# Patient Record
Sex: Male | Born: 1940
Health system: Southern US, Community
[De-identification: ages and names within clinical notes are randomized; demographics above are authoritative.]

## PROBLEM LIST (undated history)

## (undated) DIAGNOSIS — I639 Cerebral infarction, unspecified: Secondary | ICD-10-CM

## (undated) DIAGNOSIS — F329 Major depressive disorder, single episode, unspecified: Secondary | ICD-10-CM

## (undated) DIAGNOSIS — I739 Peripheral vascular disease, unspecified: Secondary | ICD-10-CM

## (undated) DIAGNOSIS — K219 Gastro-esophageal reflux disease without esophagitis: Secondary | ICD-10-CM

## (undated) DIAGNOSIS — F32A Depression, unspecified: Secondary | ICD-10-CM

## (undated) DIAGNOSIS — I10 Essential (primary) hypertension: Secondary | ICD-10-CM

## (undated) DIAGNOSIS — E7800 Pure hypercholesterolemia, unspecified: Secondary | ICD-10-CM

## (undated) HISTORY — PX: LUMBAR DISC SURGERY: SHX700

## (undated) HISTORY — PX: CHOLECYSTECTOMY: SHX55

---

## 2009-10-22 HISTORY — PX: VASCULAR SURGERY: SHX849

## 2010-04-24 ENCOUNTER — Emergency Department: Payer: Self-pay | Admitting: Emergency Medicine

## 2012-06-30 ENCOUNTER — Ambulatory Visit: Payer: Self-pay | Admitting: Family Medicine

## 2012-07-14 ENCOUNTER — Observation Stay: Payer: Self-pay | Admitting: Internal Medicine

## 2012-07-14 LAB — COMPREHENSIVE METABOLIC PANEL
Albumin: 4.1 g/dL (ref 3.4–5.0)
Alkaline Phosphatase: 109 U/L (ref 50–136)
BUN: 22 mg/dL — ABNORMAL HIGH (ref 7–18)
Bilirubin,Total: 1.1 mg/dL — ABNORMAL HIGH (ref 0.2–1.0)
Co2: 24 mmol/L (ref 21–32)
Creatinine: 0.79 mg/dL (ref 0.60–1.30)
EGFR (Non-African Amer.): >60
Glucose: 87 mg/dL (ref 65–99)
Osmolality: 275 (ref 275–301)
Potassium: 3.4 mmol/L — ABNORMAL LOW (ref 3.5–5.1)
SGPT (ALT): 19 U/L (ref 12–78)
Sodium: 136 mmol/L (ref 136–145)
Total Protein: 7.3 g/dL (ref 6.4–8.2)

## 2012-07-14 LAB — URINALYSIS, COMPLETE
Glucose,UR: NEGATIVE mg/dL (ref 0–75)
Leukocyte Esterase: NEGATIVE
Nitrite: NEGATIVE
Ph: 6 (ref 4.5–8.0)
Protein: NEGATIVE
RBC,UR: 1 /HPF (ref 0–5)
WBC UR: 4 /HPF (ref 0–5)

## 2012-07-14 LAB — CBC
HCT: 55.2 % — ABNORMAL HIGH (ref 40.0–52.0)
HGB: 19.9 g/dL — ABNORMAL HIGH (ref 13.0–18.0)
MCH: 32.7 pg (ref 26.0–34.0)
RBC: 6.08 10*6/uL — ABNORMAL HIGH (ref 4.40–5.90)
RDW: 13.4 % (ref 11.5–14.5)
WBC: 12.4 10*3/uL — ABNORMAL HIGH (ref 3.8–10.6)

## 2012-07-14 LAB — LIPASE, BLOOD: Lipase: 85 U/L (ref 73–393)

## 2012-07-15 LAB — CBC WITH DIFFERENTIAL/PLATELET
Basophil %: 0.3 %
Eosinophil %: 0.6 %
HGB: 16.7 g/dL (ref 13.0–18.0)
MCH: 32.6 pg (ref 26.0–34.0)
Monocyte %: 8.6 %
Neutrophil #: 6.8 10*3/uL — ABNORMAL HIGH (ref 1.4–6.5)
Neutrophil %: 61.2 %
Platelet: 151 10*3/uL (ref 150–440)
RBC: 5.13 10*6/uL (ref 4.40–5.90)
RDW: 13.2 % (ref 11.5–14.5)
WBC: 11.2 10*3/uL — ABNORMAL HIGH (ref 3.8–10.6)

## 2012-07-15 LAB — COMPREHENSIVE METABOLIC PANEL
Anion Gap: 11 (ref 7–16)
Calcium, Total: 8.7 mg/dL (ref 8.5–10.1)
Chloride: 104 mmol/L (ref 98–107)
Co2: 23 mmol/L (ref 21–32)
EGFR (African American): >60
EGFR (Non-African Amer.): >60
Osmolality: 276 (ref 275–301)
Potassium: 3.3 mmol/L — ABNORMAL LOW (ref 3.5–5.1)
SGPT (ALT): 14 U/L (ref 12–78)
Sodium: 138 mmol/L (ref 136–145)
Total Protein: 5.7 g/dL — ABNORMAL LOW (ref 6.4–8.2)

## 2012-07-15 LAB — MAGNESIUM: Magnesium: 1.8 mg/dL

## 2012-10-29 DIAGNOSIS — F1011 Alcohol abuse, in remission: Secondary | ICD-10-CM | POA: Insufficient documentation

## 2013-11-05 DIAGNOSIS — Z0000 Encounter for general adult medical examination without abnormal findings: Secondary | ICD-10-CM | POA: Diagnosis not present

## 2013-11-05 DIAGNOSIS — I10 Essential (primary) hypertension: Secondary | ICD-10-CM | POA: Diagnosis not present

## 2013-11-05 DIAGNOSIS — I739 Peripheral vascular disease, unspecified: Secondary | ICD-10-CM | POA: Diagnosis not present

## 2013-11-05 DIAGNOSIS — I6350 Cerebral infarction due to unspecified occlusion or stenosis of unspecified cerebral artery: Secondary | ICD-10-CM | POA: Diagnosis not present

## 2014-12-29 DIAGNOSIS — M1990 Unspecified osteoarthritis, unspecified site: Secondary | ICD-10-CM | POA: Diagnosis not present

## 2014-12-29 DIAGNOSIS — Z0000 Encounter for general adult medical examination without abnormal findings: Secondary | ICD-10-CM | POA: Diagnosis not present

## 2014-12-29 DIAGNOSIS — Z720 Tobacco use: Secondary | ICD-10-CM | POA: Diagnosis not present

## 2014-12-29 DIAGNOSIS — G4700 Insomnia, unspecified: Secondary | ICD-10-CM | POA: Diagnosis not present

## 2014-12-29 DIAGNOSIS — I6350 Cerebral infarction due to unspecified occlusion or stenosis of unspecified cerebral artery: Secondary | ICD-10-CM | POA: Diagnosis not present

## 2014-12-29 DIAGNOSIS — D485 Neoplasm of uncertain behavior of skin: Secondary | ICD-10-CM | POA: Diagnosis not present

## 2014-12-29 DIAGNOSIS — R233 Spontaneous ecchymoses: Secondary | ICD-10-CM | POA: Diagnosis not present

## 2014-12-29 DIAGNOSIS — K219 Gastro-esophageal reflux disease without esophagitis: Secondary | ICD-10-CM | POA: Diagnosis not present

## 2014-12-29 DIAGNOSIS — I739 Peripheral vascular disease, unspecified: Secondary | ICD-10-CM | POA: Diagnosis not present

## 2014-12-29 DIAGNOSIS — I10 Essential (primary) hypertension: Secondary | ICD-10-CM | POA: Diagnosis not present

## 2014-12-29 DIAGNOSIS — Z23 Encounter for immunization: Secondary | ICD-10-CM | POA: Diagnosis not present

## 2014-12-29 LAB — CBC AND DIFFERENTIAL
HCT: 53 % (ref 41–53)
Hemoglobin: 18.6 g/dL — AB (ref 13.5–17.5)

## 2014-12-29 LAB — BASIC METABOLIC PANEL
BUN: 11 mg/dL (ref 4–21)
CREATININE: 1 mg/dL (ref <=1.3)

## 2014-12-29 LAB — LIPID PANEL
Cholesterol: 200 mg/dL (ref 0–200)
HDL: 35 mg/dL (ref 35–70)
LDL Cholesterol: 122 mg/dL
Triglycerides: 217 mg/dL — AB (ref 40–160)

## 2014-12-29 LAB — TSH: TSH: 2.2 u[IU]/mL (ref <=5.90)

## 2014-12-29 LAB — HM COLONOSCOPY

## 2014-12-29 LAB — HM PAP SMEAR

## 2015-02-08 NOTE — Discharge Summary (Signed)
PATIENT NAME:  Alexander Alvarado, Alexander Alvarado MR#:  [id number] DATE OF BIRTH:  02/03/1941  DATE OF ADMISSION:  07/14/2012 DATE OF DISCHARGE:  07/15/2012  PRESENTING COMPLAINT: Nausea, vomiting, diarrhea, and weakness.   DISCHARGE DIAGNOSES:  1. Suspected acute gastroenteritis, improved.  2. Dehydration.  3. Hypertension.   CODE STATUS: FULL CODE.   MEDICATIONS:  1. Tylenol 500 mg 2 tablets at bedtime.  2. Trazodone 50 mg 3 tablets at bedtime.  3. Tramadol 50 mg b.i.d.  4. Meloxicam 7.5 mg 2 tablets at bedtime.  5. Bupropion 150 mg 1 tablet b.i.d.  6. Quinapril 10 mg daily.  7. Folic acid 1 mg daily.  8. Lovastatin 40 mg daily.  9. Omeprazole 40 mg daily.  10. Metoprolol 25 mg daily.   FOLLOW-UP: Follow-up with Dr. Salome Holmes in 1 to 2 weeks.   LABORATORY, DIAGNOSTIC, AND RADIOLOGICAL DATA: White count 11.2, hemoglobin and hematocrit 16.7 and 46.2, platelet count 151. Comprehensive metabolic panel within normal limits. SGOT 11, total protein 5.7, magnesium 1.8. Urinalysis negative for urinary tract infection.   CT of the abdomen and pelvis shows no evidence of hydronephrosis nor of calcified urinary tract stones. There is mild enlargement of the prostate gland. There is no evidence of hepatobiliary abnormality. There is no evidence of small or large bowel obstruction.   Chest x-ray no acute cardiopulmonary abnormality.   Hemoglobin and hematocrit at admission was 19.9 and 55.2. Lipase 85. Troponin less than 0.02.   BRIEF SUMMARY OF HOSPITAL COURSE: [name] is a 74 year old Caucasian gentleman with history of hypertension who came in with:  1. Nausea, vomiting, and diarrhea for one week. The patient was admitted with possible acute gastroenteritis with dehydration and dehydration which was suggested from elevated hemoglobin and hematocrit. The patient is now better after IV fluids. CT scan of the abdomen and pelvis was negative. He received about 3 liters of IV fluids. Hemodynamically he  remained stable. He was started on clear liquids and tolerated a regular diet prior to discharge.  2. Hypokalemia, repleted IV and p.o.  3. Leukocytosis, mild, appears reactive secondary to gastroenteritis. The patient remained afebrile. 4. Hypertension. His home meds were continued.  5. Generalized weakness, appeared to be due to dehydration secondary to gastroenteritis. The patient was seen by physical therapy who recommended home health PT which had been set up prior to discharge.   Hospital stay otherwise remained stable.   CODE STATUS: The patient remained a FULL CODE.   TIME SPENT: 40 minutes.   ____________________________ [name], MD sap:drc D: 07/16/2012 07:12:51 ET T: 07/17/2012 11:23:53 ET JOB#: [id number]  cc: Olivianna Higley A. Posey Pronto, MD, <Dictator> Salome Holmes, MD Ilda Basset MD ELECTRONICALLY SIGNED 07/30/2012 9:28

## 2015-02-08 NOTE — Consult Note (Signed)
Brief Consult Note: Comments: Pt was discharged before being seen.  Electronic Signatures: Orson Slick (MD)  (Signed 24-Sep-13 16:33)  Authored: Brief Consult Note   Last Updated: 24-Sep-13 16:33 by Orson Slick (MD)

## 2015-02-08 NOTE — H&P (Signed)
PATIENT NAME:  Alexander Alvarado, Alexander Alvarado MR#:  [id number] DATE OF BIRTH:  1941/02/08  DATE OF ADMISSION:  07/14/2012  PRIMARY CARE PHYSICIAN:  [facility]. REQUESTING PHYSICIAN: Dr. Renard Hamper.  CHIEF COMPLAINT: Nausea, vomiting and diarrhea.    HISTORY OF PRESENT ILLNESS: The patient is a 74 year old male with a known history of cerebrovascular accident, hypertension, is being admitted for possible acute gastroenteritis. The patient has been having nausea, vomiting, and diarrhea since last Tuesday. Stool is watery in nature, usually about one a day, has been vomiting about 3 to 4 times a day, is unable to keep anything orally down, has been feeling very weak and decided to come to the Emergency Department. While in the ED, he is still having some vomiting and was found to have potassium of 3.4 and he is being admitted for further evaluation and management.   PAST MEDICAL HISTORY:  1. Hypertension.  2. Gastroesophageal reflux disease. 3. History of cerebrovascular accident.  4. Hyperlipidemia.   ALLERGIES: No known drug allergies.   MEDICATIONS AT HOME:  1. Bupropion 150 mg p.o. b.i.d.  2. Folic acid 1 mg p.o. daily.  3. Lovastatin 40 mg p.o. daily.  4. Meloxicam 7.5 mg 2 tablets p.o. at bedtime.  5. Metoprolol 25 mg p.o. daily.  6. Omeprazole 40 mg p.o. daily.  7. Quinapril 10 mg p.o. daily.  8. Tramadol 50 mg p.o. b.i.d.  9. Trazodone 150 mg p.o. at bedtime.  10. Tylenol 1000 mg p.o. at bedtime.   SOCIAL HISTORY: Smokes about two cigarettes daily. Used to smoke about a pack a day for the last 40 years. Occasional alcohol. No drugs of abuse.   FAMILY HISTORY: Son with diabetes. Father had history of tuberculosis.   REVIEW OF SYSTEMS: CONSTITUTIONAL: No fever. Positive for fatigue and weakness. EYES: No blurred or double vision. ENT: No tinnitus or ear pain. RESPIRATORY: No cough, wheezing, or hemoptysis. CARDIOVASCULAR: No chest pain, orthopnea or edema. GASTROINTESTINAL: Positive for  nausea, vomiting and diarrhea. No abdominal pain. GENITOURINARY: No dysuria or hematuria. ENDOCRINE: No polyuria or nocturia. HEMATOLOGY: No anemia or easy bruising. SKIN: No rash or lesion. MUSCULOSKELETAL: No arthritis or muscle cramp. NEUROLOGIC: No tingling, numbness, or weakness. PSYCHIATRIC: No history of anxiety or depression.   PHYSICAL EXAMINATION:  VITAL SIGNS: Temperature 96, heart rate 50 per minute, respirations 18 per minute, blood pressure 191/96. He is saturating 96% on room air.   GENERAL: The patient is a 74 year old male lying in the bed comfortably without any acute distress.   EYES: Pupils equal, round, and reactive to light and accommodation. No scleral icterus. Extraocular muscles intact.   HEENT: Head atraumatic, normocephalic. Oropharynx and nasopharynx clear.   NECK: Supple. No jugular venous distention. No thyroid enlargement or tenderness.   LUNGS: Clear to auscultation bilaterally. No wheezing, rales, rhonchi, or crepitation.   CARDIOVASCULAR: S1, S2 normal. No murmurs, rubs, or gallops.   ABDOMEN: Soft, nontender, nondistended. Bowel sounds present. No organomegaly or mass.   EXTREMITIES: No pedal edema, cyanosis, or clubbing.   NEUROLOGIC: Nonfocal examination. Cranial nerves II through XII intact. Muscle strength 5/5 in all extremities. Sensation intact.   PSYCHIATRIC: The patient is oriented to time, place and person x3.   SKIN: No obvious rash, lesion, or ulcer.   LABORATORY, RADIOLOGICAL AND DIAGNOSTIC DATA: Normal BMP except BUN of 22, potassium 3.4. Normal first set of troponin. Liver function tests within normal limits except total bilirubin 1.1. CBC showed white count of 12.4, hemoglobin 19.9, hematocrit 55.2, platelets  169.   IMPRESSION AND PLAN:  1. Possible acute gastroenteritis. Will check stool studies with comprehensive culture. Also, order Clostridium difficile. Will get CT scan of the abdomen and pelvis for further evaluation. Repeat lab in  the morning.  2. Hypokalemia. We will replete and recheck. Check magnesium.  3. Leukocytosis, likely due to gastroenteritis. Will monitor.  4. Malignant hypertension. Will resume his blood pressure medication. Holding his metoprolol considering bradycardia. Start him on hydralazine as needed for systolic more than 397.  5. Tobacco abuse. He was counseled for about three minutes. He denies any need of nicotine replacement therapy while in the hospital. He is already trying to quit on his own.  6. Elevated hemoglobin and hematocrit, could be hemoconcentration. Will repeat labs in the morning.  TOTAL TIME TAKING CARE OF THIS PATIENT: 55 minutes.    ____________________________ Alexander Alvarado. Alexander Ghazi, MD vss:ap D: 07/14/2012 22:48:49 ET T: 07/15/2012 07:18:00 ET JOB#: [id number]  cc: Peterson Mathey S. Alexander Ghazi, MD, <Dictator> [place] MD ELECTRONICALLY SIGNED 07/15/2012 16:42

## 2015-03-12 ENCOUNTER — Encounter: Payer: Self-pay | Admitting: Internal Medicine

## 2015-03-12 DIAGNOSIS — I639 Cerebral infarction, unspecified: Secondary | ICD-10-CM | POA: Insufficient documentation

## 2015-03-12 DIAGNOSIS — M1990 Unspecified osteoarthritis, unspecified site: Secondary | ICD-10-CM | POA: Insufficient documentation

## 2015-03-12 DIAGNOSIS — F17200 Nicotine dependence, unspecified, uncomplicated: Secondary | ICD-10-CM | POA: Insufficient documentation

## 2015-03-12 DIAGNOSIS — I739 Peripheral vascular disease, unspecified: Secondary | ICD-10-CM | POA: Insufficient documentation

## 2015-03-12 DIAGNOSIS — D485 Neoplasm of uncertain behavior of skin: Secondary | ICD-10-CM | POA: Insufficient documentation

## 2015-03-12 DIAGNOSIS — K219 Gastro-esophageal reflux disease without esophagitis: Secondary | ICD-10-CM | POA: Insufficient documentation

## 2015-03-12 DIAGNOSIS — G4700 Insomnia, unspecified: Secondary | ICD-10-CM | POA: Insufficient documentation

## 2015-03-12 DIAGNOSIS — E782 Mixed hyperlipidemia: Secondary | ICD-10-CM | POA: Insufficient documentation

## 2015-03-12 DIAGNOSIS — I10 Essential (primary) hypertension: Secondary | ICD-10-CM | POA: Insufficient documentation

## 2015-03-22 DIAGNOSIS — M2042 Other hammer toe(s) (acquired), left foot: Secondary | ICD-10-CM | POA: Diagnosis not present

## 2015-03-22 DIAGNOSIS — B351 Tinea unguium: Secondary | ICD-10-CM | POA: Diagnosis not present

## 2015-03-22 DIAGNOSIS — L97521 Non-pressure chronic ulcer of other part of left foot limited to breakdown of skin: Secondary | ICD-10-CM | POA: Diagnosis not present

## 2015-03-22 DIAGNOSIS — I739 Peripheral vascular disease, unspecified: Secondary | ICD-10-CM | POA: Diagnosis not present

## 2015-07-04 ENCOUNTER — Other Ambulatory Visit: Payer: Self-pay | Admitting: Internal Medicine

## 2015-07-07 ENCOUNTER — Ambulatory Visit (INDEPENDENT_AMBULATORY_CARE_PROVIDER_SITE_OTHER): Payer: Medicare Other | Admitting: Internal Medicine

## 2015-07-07 ENCOUNTER — Encounter: Payer: Self-pay | Admitting: Internal Medicine

## 2015-07-07 VITALS — BP 140/70 | HR 54 | Ht 69.0 in | Wt 178.6 lb

## 2015-07-07 DIAGNOSIS — I10 Essential (primary) hypertension: Secondary | ICD-10-CM | POA: Diagnosis not present

## 2015-07-07 DIAGNOSIS — E785 Hyperlipidemia, unspecified: Secondary | ICD-10-CM

## 2015-07-07 DIAGNOSIS — R262 Difficulty in walking, not elsewhere classified: Secondary | ICD-10-CM | POA: Diagnosis not present

## 2015-07-07 DIAGNOSIS — I639 Cerebral infarction, unspecified: Secondary | ICD-10-CM

## 2015-07-07 DIAGNOSIS — S90415A Abrasion, left lesser toe(s), initial encounter: Secondary | ICD-10-CM

## 2015-07-07 NOTE — Progress Notes (Signed)
Date:  07/07/2015   Name:  Alexander Alvarado   DOB:  01-24-1941   MRN:  [id number]   Chief Complaint: Hypertension and Hyperlipidemia Hypertension This is a chronic problem. The current episode started more than 1 year ago. The problem is unchanged. The problem is controlled. Pertinent negatives include no chest pain, headaches, palpitations or shortness of breath.  Hyperlipidemia This is a chronic problem. Recent lipid tests were reviewed and are high. There are no known factors aggravating his hyperlipidemia. Pertinent negatives include no chest pain, myalgias or shortness of breath. Current antihyperlipidemic treatment includes statins (Lipitor started 6 months ago for high cholesterol in the setting of hypertension and stroke. ). There are no compliance problems.   Wound Check   patient complains of a sore on his left second toe that has been present for the past year. It began when he tried to clip his toenails and cut the skin of his toe instead. He denies any drainage or odor. It is tender. He would like a referral to a specialist to see if he can get it to heal. Stroke - patient is essentially wheelchair bound. He has an IT trainer wheelchair to use if he goes out in public. At home he uses a walker or cane. His home is very small and he only has to take 4-5 steps to get to any part of his house. He denies any falls. He denies any progression of weakness. He is still waiting to get into assisted living where he plans to move when it's available. He never goes out of the house - he has a neighbor who does most errands for him.  Review of Systems:  Review of Systems  Constitutional: Negative for fever and fatigue.  HENT: Negative for ear pain, tinnitus and trouble swallowing.   Respiratory: Negative for cough, shortness of breath and wheezing.   Cardiovascular: Negative for chest pain and palpitations.  Gastrointestinal: Negative for abdominal pain.  Endocrine: Negative for polyuria.   Genitourinary: Negative for frequency and hematuria.  Musculoskeletal: Positive for gait problem. Negative for myalgias.  Skin: Positive for wound. Negative for rash.  Neurological: Positive for weakness. Negative for tremors, syncope, speech difficulty, light-headedness and headaches.  Psychiatric/Behavioral: Negative for confusion.    Patient Active Problem List   Diagnosis Date Noted  . Cerebral vascular accident 03/12/2015  . Essential (primary) hypertension 03/12/2015  . Acid reflux 03/12/2015  . Hyperlipoproteinemia 03/12/2015  . Insomnia, persistent 03/12/2015  . Neoplasm of uncertain behavior of skin of hand 03/12/2015  . Arthritis, degenerative 03/12/2015  . Peripheral blood vessel disorder 03/12/2015  . Disorder of subcutaneous tissue 03/12/2015  . Compulsive tobacco user syndrome 03/12/2015  . H/O alcohol abuse 10/29/2012    Prior to Admission medications   Medication Sig Start Date End Date Taking? Authorizing Provider  amLODipine (NORVASC) 10 MG tablet Take 1 tablet by mouth daily. 12/29/14  Yes Historical Provider, MD  aspirin 81 MG tablet Take 1 tablet by mouth daily.   Yes Historical Provider, MD  atorvastatin (LIPITOR) 10 MG tablet TAKE ONE TABLET BY MOUTH AT BEDTIME 07/04/15  Yes Glean Hess, MD  cloNIDine (CATAPRES) 0.1 MG tablet Take 1 tablet by mouth 2 (two) times daily. 12/29/14  Yes Historical Provider, MD  Cyanocobalamin 1000 MCG CAPS Take 1 capsule by mouth daily.   Yes Historical Provider, MD  folic acid (FOLVITE) 1 MG tablet Take 1 tablet by mouth daily.   Yes Historical Provider, MD  meloxicam (MOBIC) 7.5 MG tablet Take  2 tablets by mouth daily as needed. 12/29/14  Yes Historical Provider, MD  metoprolol tartrate (LOPRESSOR) 25 MG tablet Take 1 tablet by mouth 2 (two) times daily. 12/29/14  Yes Historical Provider, MD  omeprazole (PRILOSEC) 40 MG capsule Take 1 capsule by mouth daily. 12/29/14  Yes Historical Provider, MD  quinapril (ACCUPRIL) 40 MG tablet  Take 1 tablet by mouth daily. 12/29/14  Yes Historical Provider, MD  traZODone (DESYREL) 50 MG tablet Take 1 tablet by mouth at bedtime. 12/29/14  Yes Historical Provider, MD    No Known Allergies  Past Surgical History  Procedure Laterality Date  . Vascular surgery Left 2011    x 5 to left leg  . Lumbar disc surgery    . Cholecystectomy      Social History  Substance Use Topics  . Smoking status: Current Every Day Smoker  . Smokeless tobacco: None  . Alcohol Use: No     Medication list has been reviewed and updated.  Physical Examination:  Physical Exam  Constitutional: He is oriented to person, place, and time. He appears well-developed.  Patient sitting in a wheelchair. Slightly disheveled but in no distress.  Neck: Normal range of motion. Neck supple. No thyromegaly present.  Cardiovascular: Normal rate, regular rhythm and normal heart sounds.   Pulmonary/Chest: Effort normal. He has decreased breath sounds.  Abdominal: Soft. There is no tenderness.  Musculoskeletal:  Moderate left-sided weakness in upper and lower extremities.  Neurological: He is alert and oriented to person, place, and time. He displays atrophy.  Left upper extremity strength 4/5 Left lower extremity 3+/5  Skin:  Abrasion with eschar tip of the fourth second toe. The second toe has a hammertoe. There is no redness swelling drainage or odor noted.    BP 140/70 mmHg  Pulse 54  Ht 5\' 9"  (1.753 m)  Wt 178 lb 9.6 oz (81.012 kg)  BMI 26.36 kg/m2  Assessment and Plan: 1. Cerebral vascular accident Stable left-sided weakness, minimally ambulatory. Dependent on others for assistance in obtaining food and for transportation  2. Essential (primary) hypertension Controlled on current medication - Comprehensive metabolic panel  3. Hyperlipoproteinemia Continue statin therapy, will obtain labs today - Lipid panel  4. Abrasion of toe of left foot, initial encounter Refer to podiatry; continue topical  antibiotic ointment in the meantime. - Ambulatory referral to Podiatry  5. Ambulatory dysfunction Continue walker in the home and wheelchair in the community Await placement in assisted living facility   Halina Maidens, MD [facility]  07/07/2015

## 2015-07-08 LAB — COMPREHENSIVE METABOLIC PANEL
ALBUMIN: 4.1 g/dL (ref 3.5–4.8)
ALT: 10 IU/L (ref 0–44)
AST: 12 IU/L (ref 0–40)
Albumin/Globulin Ratio: 1.7 (ref 1.1–2.5)
Alkaline Phosphatase: 111 IU/L (ref 39–117)
BILIRUBIN TOTAL: 0.8 mg/dL (ref 0.0–1.2)
BUN/Creatinine Ratio: 10 (ref 10–22)
BUN: 9 mg/dL (ref 8–27)
CALCIUM: 9.2 mg/dL (ref 8.6–10.2)
CHLORIDE: 96 mmol/L — AB (ref 97–108)
CO2: 26 mmol/L (ref 18–29)
CREATININE: 0.93 mg/dL (ref 0.76–1.27)
GFR calc non Af Amer: 82 mL/min/{1.73_m2} (ref >59)
GFR, EST AFRICAN AMERICAN: 95 mL/min/{1.73_m2} (ref >59)
GLUCOSE: 75 mg/dL (ref 65–99)
Globulin, Total: 2.4 g/dL (ref 1.5–4.5)
Potassium: 3.7 mmol/L (ref 3.5–5.2)
Sodium: 139 mmol/L (ref 134–144)
Total Protein: 6.5 g/dL (ref 6.0–8.5)

## 2015-07-08 LAB — LIPID PANEL
CHOL/HDL RATIO: 3.2 ratio (ref 0.0–5.0)
Cholesterol, Total: 141 mg/dL (ref 100–199)
HDL: 44 mg/dL (ref >39)
LDL CALC: 63 mg/dL (ref 0–99)
Triglycerides: 169 mg/dL — ABNORMAL HIGH (ref 0–149)
VLDL CHOLESTEROL CAL: 34 mg/dL (ref 5–40)

## 2015-07-18 DIAGNOSIS — B351 Tinea unguium: Secondary | ICD-10-CM | POA: Diagnosis not present

## 2015-07-18 DIAGNOSIS — E119 Type 2 diabetes mellitus without complications: Secondary | ICD-10-CM | POA: Diagnosis not present

## 2015-07-18 DIAGNOSIS — I739 Peripheral vascular disease, unspecified: Secondary | ICD-10-CM | POA: Diagnosis not present

## 2015-07-18 DIAGNOSIS — L97521 Non-pressure chronic ulcer of other part of left foot limited to breakdown of skin: Secondary | ICD-10-CM | POA: Diagnosis not present

## 2015-08-01 DIAGNOSIS — M2042 Other hammer toe(s) (acquired), left foot: Secondary | ICD-10-CM | POA: Diagnosis not present

## 2015-08-01 DIAGNOSIS — E119 Type 2 diabetes mellitus without complications: Secondary | ICD-10-CM | POA: Diagnosis not present

## 2015-08-01 DIAGNOSIS — L97521 Non-pressure chronic ulcer of other part of left foot limited to breakdown of skin: Secondary | ICD-10-CM | POA: Diagnosis not present

## 2015-08-02 ENCOUNTER — Other Ambulatory Visit: Payer: Self-pay | Admitting: Internal Medicine

## 2015-08-15 DIAGNOSIS — L97521 Non-pressure chronic ulcer of other part of left foot limited to breakdown of skin: Secondary | ICD-10-CM | POA: Diagnosis not present

## 2015-08-23 HISTORY — PX: AMPUTATION TOE: SHX6595

## 2015-08-29 DIAGNOSIS — L97524 Non-pressure chronic ulcer of other part of left foot with necrosis of bone: Secondary | ICD-10-CM | POA: Diagnosis not present

## 2015-08-30 ENCOUNTER — Encounter: Payer: Self-pay | Admitting: *Deleted

## 2015-08-31 ENCOUNTER — Encounter
Admission: RE | Disposition: A | Discharge status: Home / Self Care | Payer: Self-pay | Source: Ambulatory Visit | Attending: Podiatry

## 2015-08-31 ENCOUNTER — Ambulatory Visit: Payer: Medicare Other | Admitting: Anesthesiology

## 2015-08-31 ENCOUNTER — Ambulatory Visit
Admission: RE | Admit: 2015-08-31 | Discharge: 2015-08-31 | Disposition: A | Discharge status: Home / Self Care | Payer: Medicare Other | Source: Ambulatory Visit | Attending: Podiatry | Admitting: Podiatry

## 2015-08-31 ENCOUNTER — Encounter: Payer: Self-pay | Admitting: *Deleted

## 2015-08-31 DIAGNOSIS — F1010 Alcohol abuse, uncomplicated: Secondary | ICD-10-CM | POA: Diagnosis not present

## 2015-08-31 DIAGNOSIS — M1990 Unspecified osteoarthritis, unspecified site: Secondary | ICD-10-CM | POA: Diagnosis not present

## 2015-08-31 DIAGNOSIS — Z791 Long term (current) use of non-steroidal anti-inflammatories (NSAID): Secondary | ICD-10-CM | POA: Insufficient documentation

## 2015-08-31 DIAGNOSIS — Z9049 Acquired absence of other specified parts of digestive tract: Secondary | ICD-10-CM | POA: Diagnosis not present

## 2015-08-31 DIAGNOSIS — Z8673 Personal history of transient ischemic attack (TIA), and cerebral infarction without residual deficits: Secondary | ICD-10-CM | POA: Insufficient documentation

## 2015-08-31 DIAGNOSIS — I7389 Other specified peripheral vascular diseases: Secondary | ICD-10-CM | POA: Diagnosis not present

## 2015-08-31 DIAGNOSIS — F17200 Nicotine dependence, unspecified, uncomplicated: Secondary | ICD-10-CM | POA: Insufficient documentation

## 2015-08-31 DIAGNOSIS — Z7982 Long term (current) use of aspirin: Secondary | ICD-10-CM | POA: Insufficient documentation

## 2015-08-31 DIAGNOSIS — I739 Peripheral vascular disease, unspecified: Secondary | ICD-10-CM | POA: Diagnosis not present

## 2015-08-31 DIAGNOSIS — I10 Essential (primary) hypertension: Secondary | ICD-10-CM | POA: Insufficient documentation

## 2015-08-31 DIAGNOSIS — L089 Local infection of the skin and subcutaneous tissue, unspecified: Secondary | ICD-10-CM | POA: Diagnosis not present

## 2015-08-31 DIAGNOSIS — M869 Osteomyelitis, unspecified: Secondary | ICD-10-CM | POA: Diagnosis not present

## 2015-08-31 DIAGNOSIS — L97524 Non-pressure chronic ulcer of other part of left foot with necrosis of bone: Secondary | ICD-10-CM | POA: Diagnosis present

## 2015-08-31 DIAGNOSIS — Z79899 Other long term (current) drug therapy: Secondary | ICD-10-CM | POA: Insufficient documentation

## 2015-08-31 HISTORY — DX: Essential (primary) hypertension: I10

## 2015-08-31 HISTORY — DX: Peripheral vascular disease, unspecified: I73.9

## 2015-08-31 HISTORY — PX: AMPUTATION: SHX166

## 2015-08-31 HISTORY — DX: Gastro-esophageal reflux disease without esophagitis: K21.9

## 2015-08-31 HISTORY — DX: Major depressive disorder, single episode, unspecified: F32.9

## 2015-08-31 HISTORY — DX: Pure hypercholesterolemia, unspecified: E78.00

## 2015-08-31 HISTORY — DX: Depression, unspecified: F32.A

## 2015-08-31 HISTORY — DX: Cerebral infarction, unspecified: I63.9

## 2015-08-31 SURGERY — AMPUTATION DIGIT
Anesthesia: Monitor Anesthesia Care | Site: Toe | Laterality: Left | Wound class: Dirty or Infected

## 2015-08-31 MED ORDER — OXYCODONE HCL 5 MG PO TABS: 5.0000 mg | ORAL_TABLET | Freq: Once | ORAL | Status: DC | PRN

## 2015-08-31 MED ORDER — HYDROCODONE-ACETAMINOPHEN 5-325 MG PO TABS: 1.0000 | ORAL_TABLET | Freq: Four times a day (QID) | ORAL | Status: DC | PRN

## 2015-08-31 MED ORDER — ONDANSETRON HCL 4 MG/2ML IJ SOLN: 4.0000 mg | Freq: Four times a day (QID) | INTRAMUSCULAR | Status: DC | PRN

## 2015-08-31 MED ORDER — MIDAZOLAM HCL 2 MG/2ML IJ SOLN
INTRAMUSCULAR | Status: DC | PRN
  Administered 2015-08-31: 1 mg via INTRAVENOUS

## 2015-08-31 MED ORDER — FENTANYL CITRATE (PF) 100 MCG/2ML IJ SOLN
INTRAMUSCULAR | Status: DC | PRN
  Administered 2015-08-31: 50 ug via INTRAVENOUS

## 2015-08-31 MED ORDER — PROPOFOL 500 MG/50ML IV EMUL
INTRAVENOUS | Status: DC | PRN
  Administered 2015-08-31: 75 ug/kg/min via INTRAVENOUS

## 2015-08-31 MED ORDER — BUPIVACAINE HCL (PF) 0.5 % IJ SOLN
INTRAMUSCULAR | Status: DC | PRN
  Administered 2015-08-31: 2.5 mL

## 2015-08-31 MED ORDER — OXYCODONE HCL 5 MG/5ML PO SOLN: 5.0000 mg | Freq: Once | ORAL | Status: DC | PRN

## 2015-08-31 MED ORDER — CEFAZOLIN SODIUM-DEXTROSE 2-3 GM-% IV SOLR
2.0000 g | Freq: Once | INTRAVENOUS | Status: AC
  Administered 2015-08-31: 2 g via INTRAVENOUS

## 2015-08-31 MED ORDER — HYDROCODONE-ACETAMINOPHEN 5-325 MG PO TABS: 1.0000 | ORAL_TABLET | ORAL | Status: DC | PRN

## 2015-08-31 MED ORDER — LACTATED RINGERS IV SOLN
INTRAVENOUS | Status: DC
  Administered 2015-08-31: 12:00:00 via INTRAVENOUS

## 2015-08-31 MED ORDER — MEPERIDINE HCL 25 MG/ML IJ SOLN: 6.2500 mg | INTRAMUSCULAR | Status: DC | PRN

## 2015-08-31 MED ORDER — LIDOCAINE HCL (CARDIAC) 20 MG/ML IV SOLN
INTRAVENOUS | Status: DC | PRN
  Administered 2015-08-31: 50 mg via INTRAVENOUS

## 2015-08-31 MED ORDER — ONDANSETRON HCL 4 MG PO TABS: 4.0000 mg | ORAL_TABLET | Freq: Four times a day (QID) | ORAL | Status: DC | PRN

## 2015-08-31 MED ORDER — PROMETHAZINE HCL 25 MG/ML IJ SOLN: 6.2500 mg | INTRAMUSCULAR | Status: DC | PRN

## 2015-08-31 MED ORDER — HYDROMORPHONE HCL 1 MG/ML IJ SOLN: 0.2500 mg | INTRAMUSCULAR | Status: DC | PRN

## 2015-08-31 SURGICAL SUPPLY — 54 items
BANDAGE ELASTIC 4 CLIP NS LF (GAUZE/BANDAGES/DRESSINGS) ×2 IMPLANT
BLADE CRESCENTIC (BLADE) IMPLANT
BLADE MED AGGRESSIVE (BLADE) IMPLANT
BLADE MINI RND TIP GREEN BEAV (BLADE) IMPLANT
BLADE OSC/SAGITTAL 5.5X25 (BLADE) IMPLANT
BLADE OSC/SAGITTAL MD 5.5X18 (BLADE) ×2 IMPLANT
BLADE OSC/SAGITTAL MD 9X18.5 (BLADE) IMPLANT
BLADE OSCILLATING/SAGITTAL (BLADE)
BLADE SW THK.38XMED LNG THN (BLADE) IMPLANT
BNDG ESMARK 6X12 TAN STRL LF (GAUZE/BANDAGES/DRESSINGS) ×2 IMPLANT
BNDG GAUZE 4.5X4.1 6PLY STRL (MISCELLANEOUS) ×2 IMPLANT
BNDG STRETCH 4X75 STRL LF (GAUZE/BANDAGES/DRESSINGS) ×2 IMPLANT
BUR EGG 4X8 MED (BURR) IMPLANT
BUR STRYKR EGG 5.0 (BURR) IMPLANT
CANISTER SUCT 1200ML W/VALVE (MISCELLANEOUS) ×2 IMPLANT
CAST PADDING 3X4FT ST 30246 (SOFTGOODS)
CUFF TOURN SGL QUICK 18 (TOURNIQUET CUFF) IMPLANT
DRILL WIRE PASS (DRILL) IMPLANT
DURAPREP 26ML APPLICATOR (WOUND CARE) ×2 IMPLANT
ETHIBOND 2 0 GREEN CT 2 30IN (SUTURE) IMPLANT
GAUZE PETRO XEROFOAM 1X8 (MISCELLANEOUS) ×2 IMPLANT
GAUZE PETRO XEROFOAM 5X9 (MISCELLANEOUS) IMPLANT
GAUZE SPONGE 4X4 12PLY STRL (GAUZE/BANDAGES/DRESSINGS) ×2 IMPLANT
GLOVE BIO SURGEON STRL SZ7.5 (GLOVE) ×2 IMPLANT
GLOVE BIO SURGEON STRL SZ8 (GLOVE) ×2 IMPLANT
GLOVE INDICATOR 8.0 STRL GRN (GLOVE) ×2 IMPLANT
GOWN STRL REUS W/ TWL LRG LVL3 (GOWN DISPOSABLE) ×2 IMPLANT
GOWN STRL REUS W/TWL LRG LVL3 (GOWN DISPOSABLE) ×2
K-WIRE DBL END TROCAR 6X.045 (WIRE)
K-WIRE DBL END TROCAR 6X.062 (WIRE)
KWIRE DBL END TROCAR 6X.045 (WIRE) IMPLANT
KWIRE DBL END TROCAR 6X.062 (WIRE) IMPLANT
NEEDLE HYPO 18GX1.5 BLUNT FILL (NEEDLE) ×2 IMPLANT
NEEDLE HYPO 25GX1X1/2 BEV (NEEDLE) IMPLANT
PACK EXTREMITY ARMC (MISCELLANEOUS) ×2 IMPLANT
PAD CAST CTTN 3X4 STRL (SOFTGOODS) IMPLANT
PAD GROUND ADULT SPLIT (MISCELLANEOUS) ×2 IMPLANT
SPLINT FAST PLASTER 5X30 (CAST SUPPLIES)
SPLINT PLASTER CAST FAST 5X30 (CAST SUPPLIES) IMPLANT
STOCKINETTE STRL 6IN 960660 (GAUZE/BANDAGES/DRESSINGS) ×2 IMPLANT
SUT ETHILON 4-0 (SUTURE)
SUT ETHILON 4-0 FS2 18XMFL BLK (SUTURE)
SUT ETHILON 5-0 FS-2 18 BLK (SUTURE) IMPLANT
SUT VIC AB 1 CT1 36 (SUTURE) IMPLANT
SUT VIC AB 2-0 CT1 27 (SUTURE)
SUT VIC AB 2-0 CT1 TAPERPNT 27 (SUTURE) IMPLANT
SUT VIC AB 2-0 SH 27 (SUTURE)
SUT VIC AB 2-0 SH 27XBRD (SUTURE) IMPLANT
SUT VIC AB 3-0 SH 27 (SUTURE)
SUT VIC AB 3-0 SH 27X BRD (SUTURE) IMPLANT
SUT VIC AB 4-0 FS2 27 (SUTURE) IMPLANT
SUT VICRYL AB 3-0 FS1 BRD 27IN (SUTURE) IMPLANT
SUTURE ETHLN 4-0 FS2 18XMF BLK (SUTURE) IMPLANT
SYRINGE 10CC LL (SYRINGE) ×2 IMPLANT

## 2015-08-31 NOTE — Anesthesia Preprocedure Evaluation (Signed)
Anesthesia Evaluation  Patient identified by MRN, date of birth, ID band Patient awake    Reviewed: Allergy & Precautions, Patient's Chart, lab work & pertinent test results, reviewed documented beta blocker date and time   Airway Mallampati: I  TM Distance: >3 FB Neck ROM: Full    Dental no notable dental hx.    Pulmonary Current Smoker,    Pulmonary exam normal        Cardiovascular hypertension, + Peripheral Vascular Disease  Normal cardiovascular exam     Neuro/Psych PSYCHIATRIC DISORDERS Depression CVA (left sided weakness), Residual Symptoms    GI/Hepatic GERD  Controlled and Medicated,(+)     substance abuse  alcohol use,   Endo/Other    Renal/GU negative Renal ROS     Musculoskeletal  (+) Arthritis ,   Abdominal   Peds  Hematology negative hematology ROS (+)   Anesthesia Other Findings   Reproductive/Obstetrics                             Anesthesia Physical Anesthesia Plan  ASA: III  Anesthesia Plan: MAC   Post-op Pain Management:    Induction: Intravenous  Airway Management Planned:   Additional Equipment:   Intra-op Plan:   Post-operative Plan:   Informed Consent: I have reviewed the patients History and Physical, chart, labs and discussed the procedure including the risks, benefits and alternatives for the proposed anesthesia with the patient or authorized representative who has indicated his/her understanding and acceptance.     Plan Discussed with: CRNA  Anesthesia Plan Comments:         Anesthesia Quick Evaluation

## 2015-08-31 NOTE — Anesthesia Procedure Notes (Signed)
Procedure Name: MAC Performed by: Sidney Kann Pre-anesthesia Checklist: Patient identified, Emergency Drugs available, Suction available, Timeout performed and Patient being monitored Patient Re-evaluated:Patient Re-evaluated prior to inductionOxygen Delivery Method: Nasal cannula Placement Confirmation: positive ETCO2       

## 2015-08-31 NOTE — Op Note (Signed)
Operative note   Surgeon:Dirck Butch    Assistant:none    Preop diagnosis:ulcercation with necrosis to bone left 2nd toe    Postop diagnosis:same     Procedure:Amputation left 2nd toe partial      IPP:[id number]    Anesthesia:local and IV sedation    Hemostasis:none    Specimen:amputated left 2nd toe    Complications:none     Operative indications: 73 year old gentleman with an ulcer on the tip of his left second toe with exposed bone and necrosis of the surrounding soft tissue. Presents today for surgical amputation the risks benefits alternatives and competitions have been discussed and consent has been given.    Procedure:  Patient was brought into the OR and placed on the operating table in thesupine position. After anesthesia was obtained theleft lower extremity was prepped and draped in usual sterile fashion.  Attention was directed to the distal aspect of the left second toe where at the level of the PIPJ a fishmouth incision was performed. Full-thickness incision was taken down to bone. Residual mild necrotic skin was noted plantarly. The head of the proximal phalanx was then excised with a power saw. I was able to relocate the skin flaps with no tension. The wound was then flushed with copious amounts or irrigation. Closure was performed with a 5-0 nylon..    Patient tolerated the procedure and anesthesia well.  Was transported from the OR to the PACU with all vital signs stable and vascular status intact. To be discharged per routine protocol.  Will follow up in approximately 1 week in the outpatient clinic.

## 2015-08-31 NOTE — Anesthesia Postprocedure Evaluation (Signed)
  Anesthesia Post-op Note  Patient: Alexander Alvarado  Procedure(s) Performed: Procedure(s): AMPUTATION DIGIT left 2nd toe  (Left)  Anesthesia type:MAC  Patient location: PACU  Post pain: Pain level controlled  Post assessment: Post-op Vital signs reviewed, Patient's Cardiovascular Status Stable, Respiratory Function Stable, Patent Airway and No signs of Nausea or vomiting  Post vital signs: Reviewed and stable  Last Vitals:  Filed Vitals:   08/31/15 1330  BP: 154/87  Pulse: 49  Temp:   Resp: 12    Level of consciousness: awake, alert  and patient cooperative  Complications: No apparent anesthesia complications

## 2015-08-31 NOTE — H&P (Signed)
  HISTORY AND PHYSICAL INTERVAL NOTE:  08/31/2015  11:49 AM  Alexander Alvarado  has presented today for surgery, with the diagnosis [place] [address] LEFT 2ND TOE AMPUTATION IPJ L97.524 ULCER OF LEFT FOOT W/ NECROSISI OF BONE.  The various methods of treatment have been discussed with the patient.  No guarantees were given.  After consideration of risks, benefits and other options for treatment, the patient has consented to surgery.  I have reviewed the patients' chart and labs.    Patient Vitals for the past 24 hrs:  BP Temp Pulse Resp SpO2 Height Weight  08/31/15 1123 136/81 mmHg 99.1 F (37.3 C) (!) 52 16 97 % 5\' 9"  (1.753 m) 80.287 kg (177 lb)  08/30/15 1158 - - - - - 5\' 9"  (1.753 m) 77.111 kg (170 lb)    A history and physical examination was performed in my office.  The patient was reexamined.  There have been no changes to this history and physical examination.Plan for left 2nd toe partial amputation.  Samara Deist A

## 2015-08-31 NOTE — Discharge Instructions (Signed)
General Anesthesia, Adult, Care After Refer to this sheet in the next few weeks. These instructions provide you with information on caring for yourself after your procedure. Your health care provider may also give you more specific instructions. Your treatment has been planned according to current medical practices, but problems sometimes occur. Call your health care provider if you have any problems or questions after your procedure. WHAT TO EXPECT AFTER THE PROCEDURE After the procedure, it is typical to experience:  Sleepiness.  Nausea and vomiting. HOME CARE INSTRUCTIONS  For the first 24 hours after general anesthesia:  Have a responsible person with you.  Do not drive a car. If you are alone, do not take public transportation.  Do not drink alcohol.  Do not take medicine that has not been prescribed by your health care provider.  Do not sign important papers or make important decisions.  You may resume a normal diet and activities as directed by your health care provider.  Change bandages (dressings) as directed.  If you have questions or problems that seem related to general anesthesia, call the hospital and ask for the anesthetist or anesthesiologist on call. SEEK MEDICAL CARE IF:  You have nausea and vomiting that continue the day after anesthesia.  You develop a rash. SEEK IMMEDIATE MEDICAL CARE IF:   You have difficulty breathing.  You have chest pain.  You have any allergic problems.   This information is not intended to replace advice given to you by your health care provider. Make sure you discuss any questions you have with your health care provider.   Document Released: 01/14/2001 Document Revised: 10/29/2014 Document Reviewed: 02/06/2012 Elsevier Interactive Patient Education 2016 [place]  POST OPERATIVE INSTRUCTIONS FOR DR. [place]   1. Take your medication as prescribed.  Pain medication should be taken only as needed.  2. Keep the dressing clean, dry and intact.  3. Keep your foot elevated above the heart level for the first 48 hours.  4. Walking to the bathroom and brief periods of walking are acceptable, unless we have instructed you to be non-weight bearing.  5. Always wear your post-op shoe when walking.  Always use your crutches if you are to be non-weight bearing.  6. Do not take a shower. Baths are permissible as long as the foot is kept out of the water.   7. Every hour you are awake:  - Bend your knee 15 times. - Flex foot 15 times - Massage calf 15 times  8. Call [facility] [phone]) if any of the following problems occur: - You develop a temperature or fever. - The bandage becomes saturated with blood. - Medication does not stop your pain. - Injury of the foot occurs. - Any symptoms of infection including redness, odor, or red streaks running from wound. -

## 2015-08-31 NOTE — Anesthesia Postprocedure Evaluation (Signed)
  Anesthesia Post-op Note  Patient: Alexander Alvarado  Procedure(s) Performed: Procedure(s): AMPUTATION DIGIT left 2nd toe  (Left)  Anesthesia type:MAC  Patient location: PACU  Post pain: Pain level controlled  Post assessment: Post-op Vital signs reviewed, Patient's Cardiovascular Status Stable, Respiratory Function Stable, Patent Airway and No signs of Nausea or vomiting  Post vital signs: Reviewed and stable  Last Vitals:  Filed Vitals:   08/31/15 1300  BP: 141/73  Pulse: 47  Temp:   Resp: 18    Level of consciousness: awake, alert  and patient cooperative  Complications: No apparent anesthesia complications

## 2015-08-31 NOTE — Transfer of Care (Signed)
Immediate Anesthesia Transfer of Care Note  Patient: Alexander Alvarado  Procedure(s) Performed: Procedure(s): AMPUTATION DIGIT left 2nd toe  (Left)  Patient Location: PACU  Anesthesia Type: MAC  Level of Consciousness: awake, alert  and patient cooperative  Airway and Oxygen Therapy: Patient Spontanous Breathing and Patient connected to supplemental oxygen  Post-op Assessment: Post-op Vital signs reviewed, Patient's Cardiovascular Status Stable, Respiratory Function Stable, Patent Airway and No signs of Nausea or vomiting  Post-op Vital Signs: Reviewed and stable  Complications: No apparent anesthesia complications

## 2015-09-01 ENCOUNTER — Encounter: Payer: Self-pay | Admitting: Podiatry

## 2015-09-02 LAB — SURGICAL PATHOLOGY

## 2015-09-05 DIAGNOSIS — M79672 Pain in left foot: Secondary | ICD-10-CM | POA: Diagnosis not present

## 2015-10-19 DIAGNOSIS — D485 Neoplasm of uncertain behavior of skin: Secondary | ICD-10-CM | POA: Diagnosis not present

## 2015-10-19 DIAGNOSIS — M25559 Pain in unspecified hip: Secondary | ICD-10-CM | POA: Diagnosis not present

## 2015-10-19 DIAGNOSIS — M47816 Spondylosis without myelopathy or radiculopathy, lumbar region: Secondary | ICD-10-CM | POA: Diagnosis not present

## 2015-10-19 DIAGNOSIS — B351 Tinea unguium: Secondary | ICD-10-CM | POA: Diagnosis not present

## 2015-10-19 DIAGNOSIS — M4806 Spinal stenosis, lumbar region: Secondary | ICD-10-CM | POA: Diagnosis not present

## 2015-10-19 DIAGNOSIS — Z89422 Acquired absence of other left toe(s): Secondary | ICD-10-CM | POA: Diagnosis not present

## 2015-11-18 ENCOUNTER — Other Ambulatory Visit: Payer: Self-pay | Admitting: Internal Medicine

## 2015-12-23 ENCOUNTER — Other Ambulatory Visit: Payer: Self-pay | Admitting: Internal Medicine

## 2015-12-30 ENCOUNTER — Encounter: Payer: Self-pay | Admitting: Internal Medicine

## 2015-12-30 ENCOUNTER — Ambulatory Visit (INDEPENDENT_AMBULATORY_CARE_PROVIDER_SITE_OTHER): Payer: Medicare Other | Admitting: Internal Medicine

## 2015-12-30 VITALS — BP 118/72 | HR 56 | Ht 69.0 in | Wt 173.0 lb

## 2015-12-30 DIAGNOSIS — R233 Spontaneous ecchymoses: Secondary | ICD-10-CM | POA: Diagnosis not present

## 2015-12-30 DIAGNOSIS — K219 Gastro-esophageal reflux disease without esophagitis: Secondary | ICD-10-CM | POA: Diagnosis not present

## 2015-12-30 DIAGNOSIS — Z0000 Encounter for general adult medical examination without abnormal findings: Secondary | ICD-10-CM | POA: Diagnosis not present

## 2015-12-30 DIAGNOSIS — M19029 Primary osteoarthritis, unspecified elbow: Secondary | ICD-10-CM

## 2015-12-30 DIAGNOSIS — E782 Mixed hyperlipidemia: Secondary | ICD-10-CM

## 2015-12-30 DIAGNOSIS — G4700 Insomnia, unspecified: Secondary | ICD-10-CM | POA: Diagnosis not present

## 2015-12-30 DIAGNOSIS — Z23 Encounter for immunization: Secondary | ICD-10-CM

## 2015-12-30 DIAGNOSIS — I6350 Cerebral infarction due to unspecified occlusion or stenosis of unspecified cerebral artery: Secondary | ICD-10-CM | POA: Insufficient documentation

## 2015-12-30 DIAGNOSIS — I10 Essential (primary) hypertension: Secondary | ICD-10-CM | POA: Diagnosis not present

## 2015-12-30 DIAGNOSIS — I739 Peripheral vascular disease, unspecified: Secondary | ICD-10-CM | POA: Diagnosis not present

## 2015-12-30 LAB — POCT URINALYSIS DIPSTICK
Bilirubin, UA: NEGATIVE
Blood, UA: NEGATIVE
Glucose, UA: NEGATIVE
KETONES UA: NEGATIVE
LEUKOCYTES UA: NEGATIVE
NITRITE UA: NEGATIVE
PH UA: 7.5
PROTEIN UA: NEGATIVE
Spec Grav, UA: 1.015
Urobilinogen, UA: 4

## 2015-12-30 MED ORDER — TRAZODONE HCL 50 MG PO TABS: 100.0000 mg | ORAL_TABLET | Freq: Every day | ORAL | Status: DC

## 2015-12-30 MED ORDER — MELOXICAM 7.5 MG PO TABS: 15.0000 mg | ORAL_TABLET | Freq: Every day | ORAL | Status: DC

## 2015-12-30 MED ORDER — CLONIDINE HCL 0.1 MG PO TABS: 0.1000 mg | ORAL_TABLET | Freq: Two times a day (BID) | ORAL | Status: DC

## 2015-12-30 MED ORDER — AMLODIPINE BESYLATE 10 MG PO TABS: 10.0000 mg | ORAL_TABLET | Freq: Every day | ORAL | Status: DC

## 2015-12-30 MED ORDER — OMEPRAZOLE 40 MG PO CPDR: 40.0000 mg | DELAYED_RELEASE_CAPSULE | Freq: Every day | ORAL | Status: DC

## 2015-12-30 MED ORDER — QUINAPRIL HCL 40 MG PO TABS: 40.0000 mg | ORAL_TABLET | Freq: Every day | ORAL | Status: DC

## 2015-12-30 MED ORDER — ATORVASTATIN CALCIUM 10 MG PO TABS: 10.0000 mg | ORAL_TABLET | Freq: Every day | ORAL | Status: DC

## 2015-12-30 MED ORDER — METOPROLOL TARTRATE 25 MG PO TABS: 25.0000 mg | ORAL_TABLET | Freq: Two times a day (BID) | ORAL | Status: DC

## 2015-12-30 NOTE — Progress Notes (Signed)
Patient: Alexander Alvarado, Male    DOB: 29-Jan-1941, 75 y.o.   MRN: [id number] Visit Date: 12/30/2015  Today's Provider: Halina Maidens, MD   Chief Complaint  Patient presents with  . Medicare Wellness   Subjective:    Annual wellness visit Alexander Alvarado is a 75 y.o. male who presents today for his Subsequent Annual Wellness Visit. He feels fairly well. He reports exercising none due to stroke. He reports he is sleeping poorly - has trouble going to sleep to stress. He lives alone but is moving to a senior apartment complex soon.  ----------------------------------------------------------- Hypertension This is a chronic problem. The current episode started more than 1 year ago. The problem is unchanged. The problem is controlled. Pertinent negatives include no chest pain, headaches, palpitations or shortness of breath.  Hyperlipidemia This is a chronic problem. The current episode started more than 1 year ago. The problem is controlled. Pertinent negatives include no chest pain or shortness of breath. Current antihyperlipidemic treatment includes statins.  Gastroesophageal Reflux He reports no abdominal pain, no chest pain, no coughing, no nausea or no wheezing. This is a chronic problem. The problem occurs rarely. The problem has been unchanged. Pertinent negatives include no fatigue. He has tried a PPI for the symptoms. The treatment provided significant relief.  Stroke with left sided sensory deficit - Patient suffered a stroke resulting in left arm and leg numbness with minimal weakness. He's able to walk around the house with a Rollator. He also has a scooter if he needs it. He uses a wheelchair out in the community. PVD - patient has known PVD but denies any skin ulcerations, blisters or claudication.  He is very sedentary - mostly watches TV and smokes during the day.  Review of Systems  Constitutional: Negative for fever, chills and fatigue.  HENT: Negative for hearing loss,  tinnitus, trouble swallowing and voice change.   Eyes: Negative for visual disturbance.  Respiratory: Negative for cough, chest tightness, shortness of breath and wheezing.   Cardiovascular: Negative for chest pain, palpitations and leg swelling.  Gastrointestinal: Negative for nausea, abdominal pain and diarrhea.  Endocrine: Negative for polydipsia and polyuria.  Genitourinary: Negative for dysuria and hematuria.  Musculoskeletal: Positive for arthralgias and gait problem.  Skin: Positive for color change (frequent bruising).  Neurological: Positive for weakness and numbness. Negative for dizziness, tremors, seizures, syncope and headaches.  Hematological: Bruises/bleeds easily.  Psychiatric/Behavioral: Positive for sleep disturbance and dysphoric mood. Negative for suicidal ideas and self-injury.    Social History   Social History  . Marital Status: Single    Spouse Name: N/A  . Number of Children: N/A  . Years of Education: N/A   Occupational History  . Not on file.   Social History Main Topics  . Smoking status: Current Every Day Smoker -- 1.00 packs/day    Types: Cigarettes  . Smokeless tobacco: Never Used  . Alcohol Use: No  . Drug Use: No  . Sexual Activity: Not on file   Other Topics Concern  . Not on file   Social History Narrative    Patient Active Problem List   Diagnosis Date Noted  . Cerebral vascular accident ([place]) 03/12/2015  . Essential (primary) hypertension 03/12/2015  . Acid reflux 03/12/2015  . Hyperlipoproteinemia 03/12/2015  . Insomnia, persistent 03/12/2015  . Neoplasm of uncertain behavior of skin of hand 03/12/2015  . Arthritis, degenerative 03/12/2015  . Peripheral blood vessel disorder ([place]) 03/12/2015  . Disorder of subcutaneous tissue 03/12/2015  .  Compulsive tobacco user syndrome 03/12/2015  . H/O alcohol abuse 10/29/2012    Past Surgical History  Procedure Laterality Date  . Vascular surgery Left 2011    x 5 to left leg  .  Lumbar disc surgery    . Cholecystectomy    . Amputation toe Left 08/2015    second toe  . Amputation Left 08/31/2015    Procedure: AMPUTATION DIGIT left 2nd toe ;  Surgeon: Samara Deist, DPM;  Location: [facility];  Service: Podiatry;  Laterality: Left;    His family history includes CAD in his mother.    Previous Medications   AMLODIPINE (NORVASC) 10 MG TABLET    Take 1 tablet by mouth daily.    ASPIRIN 81 MG TABLET    Take 1 tablet by mouth daily.    ATORVASTATIN (LIPITOR) 10 MG TABLET    TAKE ONE TABLET BY MOUTH AT BEDTIME   CLONIDINE (CATAPRES) 0.1 MG TABLET    Take 1 tablet by mouth daily.    CYANOCOBALAMIN 1000 MCG CAPS    Take 1 capsule by mouth daily.   FOLIC ACID (FOLVITE) 1 MG TABLET    Take 1 tablet by mouth daily.   HYDROCODONE-ACETAMINOPHEN (NORCO) 5-325 MG TABLET    Take 1 tablet by mouth every 6 (six) hours as needed for moderate pain.   MELOXICAM (MOBIC) 7.5 MG TABLET    TAKE TWO TABLETS BY MOUTH AT BEDTIME   METOPROLOL TARTRATE (LOPRESSOR) 25 MG TABLET    TAKE ONE TABLET BY MOUTH TWICE DAILY   OMEPRAZOLE (PRILOSEC) 40 MG CAPSULE    TAKE ONE CAPSULE BY MOUTH ONCE DAILY   QUINAPRIL (ACCUPRIL) 40 MG TABLET    TAKE ONE TABLET BY MOUTH ONCE DAILY   TRAZODONE (DESYREL) 50 MG TABLET    TAKE ONE TABLET BY MOUTH ONCE DAILY    Patient Care Team: Glean Hess, MD as PCP - General (Family Medicine)     Objective:   Vitals: BP 118/72 mmHg  Pulse 56  Ht 5\' 9"  (1.753 m)  Wt 173 lb (78.472 kg)  BMI 25.54 kg/m2  Physical Exam  Constitutional: He is oriented to person, place, and time. He appears well-developed and well-nourished.  HENT:  Head: Normocephalic.  Right Ear: Tympanic membrane, external ear and ear canal normal.  Left Ear: Tympanic membrane, external ear and ear canal normal.  Nose: Nose normal. Right sinus exhibits no maxillary sinus tenderness. Left sinus exhibits no maxillary sinus tenderness.  Mouth/Throat: Uvula is midline and oropharynx is  clear and moist.  Eyes: Conjunctivae and EOM are normal. Pupils are equal, round, and reactive to light.  Neck: Normal range of motion. Neck supple. Carotid bruit is not present. No thyromegaly present.  Cardiovascular: Normal rate, regular rhythm, normal heart sounds and intact distal pulses.   Pulmonary/Chest: Effort normal and breath sounds normal. He has no wheezes. Right breast exhibits no mass. Left breast exhibits no mass.    Abdominal: Soft. Normal appearance and bowel sounds are normal. There is no hepatosplenomegaly. There is no tenderness.  Musculoskeletal: Normal range of motion.  Lymphadenopathy:    He has no cervical adenopathy.  Neurological: He is alert and oriented to person, place, and time. He has normal strength and normal reflexes. A sensory deficit is present.  Both upper and lower leg on left  Skin: Skin is warm, dry and intact. Ecchymosis (scattered over arms) noted.  Psychiatric: He has a normal mood and affect. His speech is normal and behavior is  normal. Judgment and thought content normal.  Nursing note and vitals reviewed.   Activities of Daily Living In your present state of health, do you have any difficulty performing the following activities: 08/31/2015 07/07/2015  Hearing? N N  Vision? N Y  Difficulty concentrating or making decisions? N Y  Walking or climbing stairs? Y Y  Dressing or bathing? N Y  Doing errands, shopping? - Y    Fall Risk Assessment Fall Risk  07/07/2015  Falls in the past year? No      Depression Screen PHQ 2/9 Scores 07/07/2015  PHQ - 2 Score 2  PHQ- 9 Score 12    Cognitive Testing - 6-CIT   Correct? Score   What year is it? yes 0 Yes = 0    No = 4  What month is it? yes 0 Yes = 0    No = 3  Remember:     [address]ia [address], [place], [place]     What time is it? yes 0 Yes = 0    No = 3  Count backwards from 20 to 1 yes 0 Correct = 0    1 error = 2   More than 1 error = 4  Say the months of the year in reverse. yes 0  Correct = 0    1 error = 2   More than 1 error = 4  What address did I ask you to remember? no 2 Correct = 0  1 error = 2    2 error = 4    3 error = 6    4 error = 8    All wrong = 10       TOTAL SCORE  2/28   Interpretation:  Normal  Normal (0-7) Abnormal (8-28)      Medicare Annual Wellness Visit Summary:  Reviewed patient's Family Medical History Reviewed and updated list of patient's medical providers Assessment of cognitive impairment was done Assessed patient's functional ability Established a written schedule for health screening [place] Completed and Reviewed  Exercise Activities and Dietary recommendations Goals    None      Immunization History  Administered Date(s) Administered  . Pneumococcal Conjugate-13 12/29/2014    Health Maintenance  Topic Date Due  . TETANUS/TDAP  08/06/1962  . ZOSTAVAX  08/07/2003  . PNA vac Low Risk Adult (2 of 2 - PPSV23) 12/29/2015  . INFLUENZA VACCINE  01/20/2016 (Originally 05/23/2015)  . COLONOSCOPY  12/28/2024     Discussed health benefits of physical activity, and encouraged him to engage in regular exercise appropriate for his age and condition.    ------------------------------------------------------------------------------------------------------------   Assessment & Plan:  1. 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 Essential (primary) hypertension controlled - CBC with Differential/Platelet - Comprehensive metabolic panel - TSH - amLODipine (NORVASC) 10 MG tablet; Take 1 tablet (10 mg total) by mouth daily.  Dispense: 30 tablet; Refill: 12 - cloNIDine (CATAPRES) 0.1 MG tablet; Take 1 tablet (0.1 mg total) by mouth 2 (two) times daily.  Dispense: 60 tablet;  Refill: 12 - metoprolol tartrate (LOPRESSOR) 25 MG tablet; Take 1 tablet (25 mg total) by mouth 2 (two) times daily.  Dispense: 60 tablet; Refill: 12 - quinapril (ACCUPRIL) 40 MG tablet; Take 1 tablet (40 mg  total) by mouth daily.  Dispense: 30 tablet; Refill: 12  3. Cerebrovascular accident (CVA) due to occlusion of cerebral artery (HCC) Stable residual numbness and mild weakness  4. Peripheral blood vessel disorder (HCC) No ulcerations or claudication  5. Hyperlipidemia, mixed - Lipid panel - atorvastatin (LIPITOR) 10 MG tablet; Take 1 tablet (10 mg total) by mouth at bedtime.  Dispense: 30 tablet; Refill: 12  6. Need for diphtheria-tetanus-pertussis (Tdap) vaccine - Tdap vaccine greater than or equal to 7yo IM  7. Primary osteoarthritis of elbow, unspecified laterality Continue mobic; may take aleve PM if needed - meloxicam (MOBIC) 7.5 MG tablet; Take 2 tablets (15 mg total) by mouth at bedtime.  Dispense: 30 tablet; Refill: 12  8. Insomnia, persistent Will increase trazodone dose - traZODone (DESYREL) 50 MG tablet; Take 2 tablets (100 mg total) by mouth daily.  Dispense: 60 tablet; Refill: 12  9. Gastroesophageal reflux disease, esophagitis presence not specified - omeprazole (PRILOSEC) 40 MG capsule; Take 1 capsule (40 mg total) by mouth daily.  Dispense: 30 capsule; Refill: 12  10. Senile ecchymosis Local care discussed Continue Aspirin  Halina Maidens, MD [facility]  12/30/2015

## 2015-12-30 NOTE — Patient Instructions (Addendum)
Health Maintenance  Topic Date Due  . TETANUS/TDAP  08/06/1962  . ZOSTAVAX  08/07/2003  . PNA vac Low Risk Adult (2 of 2 - PPSV23) 12/29/2015  . INFLUENZA VACCINE  01/20/2016 (Originally 05/23/2015)  . COLONOSCOPY  12/28/2024   Tdap Vaccine (Tetanus, Diphtheria and Pertussis): What You Need to Know 1. Why get vaccinated? Tetanus, diphtheria and pertussis are very serious diseases. Tdap vaccine can protect [place] from these diseases. And, Tdap vaccine given to pregnant women can protect newborn babies against pertussis. TETANUS (Lockjaw) is rare in the [place] today. It causes painful muscle tightening and stiffness, usually all over the body.  It can lead to tightening of muscles in the head and neck so you can't open your mouth, swallow, or sometimes even breathe. Tetanus kills about 1 out of 10 people who are infected even after receiving the best medical care. DIPHTHERIA is also rare in the [place] today. It can cause a thick coating to form in the back of the throat.  It can lead to breathing problems, heart failure, paralysis, and death. PERTUSSIS (Whooping Cough) causes severe coughing spells, which can cause difficulty breathing, vomiting and disturbed sleep.  It can also lead to weight loss, incontinence, and rib fractures. Up to 2 in 100 adolescents and 5 in 100 adults with pertussis are hospitalized or have complications, which could include pneumonia or death. These diseases are caused by bacteria. Diphtheria and pertussis are spread from person to person through secretions from coughing or sneezing. Tetanus enters the body through cuts, scratches, or wounds. Before vaccines, as many as 200,000 cases of diphtheria, 200,000 cases of pertussis, and hundreds of cases of tetanus, were reported in the [place] each year. Since vaccination began, reports of cases for tetanus and diphtheria have dropped by about 99% and for pertussis by about 80%. 2. Tdap vaccine Tdap vaccine  can protect adolescents and adults from tetanus, diphtheria, and pertussis. One dose of Tdap is routinely given at age 57 or 15. People who did not get Tdap at that age should get it as soon as possible. Tdap is especially important for healthcare professionals and anyone having close contact with a baby younger than 12 months. Pregnant women should get a dose of Tdap during every pregnancy, to protect the newborn from pertussis. Infants are most at risk for severe, life-threatening complications from pertussis. Another vaccine, called Td, protects against tetanus and diphtheria, but not pertussis. A Td booster should be given every 10 years. Tdap may be given as one of these boosters if you have never gotten Tdap before. Tdap may also be given after a severe cut or burn to prevent tetanus infection. Your doctor or the person giving you the vaccine can give you more information. Tdap may safely be given at the same time as other vaccines. 3. Some people should not get this vaccine  A person who has ever had a life-threatening allergic reaction after a previous dose of any diphtheria, tetanus or pertussis containing vaccine, OR has a severe allergy to any part of this vaccine, should not get Tdap vaccine. Tell the person giving the vaccine about any severe allergies.  Anyone who had coma or long repeated seizures within 7 days after a childhood dose of DTP or DTaP, or a previous dose of Tdap, should not get Tdap, unless a cause other than the vaccine was found. They can still get Td.  Talk to your doctor if you:  have seizures or another nervous system  problem,  had severe pain or swelling after any vaccine containing diphtheria, tetanus or pertussis,  ever had a condition called Guillain-Barr Syndrome (GBS),  aren't feeling well on the day the shot is scheduled. 4. Risks With any medicine, including vaccines, there is a chance of side effects. These are usually mild and go away on their own.  Serious reactions are also possible but are rare. Most people who get Tdap vaccine do not have any problems with it. Mild problems following Tdap (Did not interfere with activities)  Pain where the shot was given (about 3 in 4 adolescents or 2 in 3 adults)  Redness or swelling where the shot was given (about 1 person in 5)  Mild fever of at least 100.61F (up to about 1 in 25 adolescents or 1 in 100 adults)  Headache (about 3 or 4 people in 10)  Tiredness (about 1 person in 3 or 4)  Nausea, vomiting, diarrhea, stomach ache (up to 1 in 4 adolescents or 1 in 10 adults)  Chills, sore joints (about 1 person in 10)  Body aches (about 1 person in 3 or 4)  Rash, swollen glands (uncommon) Moderate problems following Tdap (Interfered with activities, but did not require medical attention)  Pain where the shot was given (up to 1 in 5 or 6)  Redness or swelling where the shot was given (up to about 1 in 16 adolescents or 1 in 12 adults)  Fever over 102F (about 1 in 100 adolescents or 1 in 250 adults)  Headache (about 1 in 7 adolescents or 1 in 10 adults)  Nausea, vomiting, diarrhea, stomach ache (up to 1 or 3 people in 100)  Swelling of the entire arm where the shot was given (up to about 1 in 500). Severe problems following Tdap (Unable to perform usual activities; required medical attention)  Swelling, severe pain, bleeding and redness in the arm where the shot was given (rare). Problems that could happen after any vaccine:  People sometimes faint after a medical procedure, including vaccination. Sitting or lying down for about 15 minutes can help prevent fainting, and injuries caused by a fall. Tell your doctor if you feel dizzy, or have vision changes or ringing in the ears.  Some people get severe pain in the shoulder and have difficulty moving the arm where a shot was given. This happens very rarely.  Any medication can cause a severe allergic reaction. Such reactions from a  vaccine are very rare, estimated at fewer than 1 in a million doses, and would happen within a few minutes to a few hours after the vaccination. As with any medicine, there is a very remote chance of a vaccine causing a serious injury or death. The safety of vaccines is always being monitored. For more information, visit: http://www.aguilar.org/ 5. What if there is a serious problem? What should I look for?  Look for anything that concerns you, such as signs of a severe allergic reaction, very high fever, or unusual behavior.  Signs of a severe allergic reaction can include hives, swelling of the face and throat, difficulty breathing, a fast heartbeat, dizziness, and weakness. These would usually start a few minutes to a few hours after the vaccination. What should I do?  If you think it is a severe allergic reaction or other emergency that can't wait, call 9-1-1 or get the person to the nearest hospital. Otherwise, call your doctor.  Afterward, the reaction should be reported to the Vaccine Adverse Event Reporting System (  VAERS). Your doctor might file this report, or you can do it yourself through the VAERS web site at www.vaers.SamedayNews.es, or by calling 7812945829. VAERS does not give medical advice.  6. The National Vaccine Injury Compensation Program The [employer] Vaccine Injury Compensation Program (VICP) is a federal program that was created to compensate people who may have been injured by certain vaccines. Persons who believe they may have been injured by a vaccine can learn about the program and about filing a claim by calling (610)137-2743 or visiting the [place] website at GoldCloset.com.ee. There is a time limit to file a claim for compensation. 7. How can I learn more?  Ask your doctor. He or she can give you the vaccine package insert or suggest other sources of information.  Call your local or state health department.  Contact the Centers for Disease Control  and Prevention (CDC):  Call 720-824-2758 (1-800-CDC-INFO) or  Visit CDC's website at http://Stall.com/ CDC Tdap Vaccine VIS (12/15/13)   This information is not intended to replace advice given to you by your health care provider. Make sure you discuss any questions you have with your health care provider.   Document Released: 04/08/2012 Document Revised: 10/29/2014 Document Reviewed: 01/20/2014 Elsevier Interactive Patient Education [employer].

## 2015-12-31 LAB — CBC WITH DIFFERENTIAL/PLATELET
BASOS: 0 %
Basophils Absolute: 0 10*3/uL (ref 0.0–0.2)
EOS (ABSOLUTE): 0 10*3/uL (ref 0.0–0.4)
Eos: 0 %
HEMATOCRIT: 56.3 % — AB (ref 37.5–51.0)
HEMOGLOBIN: 19.5 g/dL — AB (ref 12.6–17.7)
IMMATURE GRANS (ABS): 0 10*3/uL (ref 0.0–0.1)
Immature Granulocytes: 0 %
LYMPHS ABS: 2.7 10*3/uL (ref 0.7–3.1)
LYMPHS: 30 %
MCH: 31.2 pg (ref 26.6–33.0)
MCHC: 34.6 g/dL (ref 31.5–35.7)
MCV: 90 fL (ref 79–97)
Monocytes Absolute: 0.5 10*3/uL (ref 0.1–0.9)
Monocytes: 6 %
NEUTROS ABS: 5.9 10*3/uL (ref 1.4–7.0)
Neutrophils: 64 %
Platelets: 167 10*3/uL (ref 150–379)
RBC: 6.26 x10E6/uL — ABNORMAL HIGH (ref 4.14–5.80)
RDW: 15.3 % (ref 12.3–15.4)
WBC: 9.2 10*3/uL (ref 3.4–10.8)

## 2015-12-31 LAB — COMPREHENSIVE METABOLIC PANEL
A/G RATIO: 1.7 (ref 1.1–2.5)
ALBUMIN: 4.1 g/dL (ref 3.5–4.8)
ALT: 8 IU/L (ref 0–44)
AST: 15 IU/L (ref 0–40)
Alkaline Phosphatase: 148 IU/L — ABNORMAL HIGH (ref 39–117)
BILIRUBIN TOTAL: 0.7 mg/dL (ref 0.0–1.2)
BUN / CREAT RATIO: 9 — AB (ref 10–22)
BUN: 8 mg/dL (ref 8–27)
CHLORIDE: 94 mmol/L — AB (ref 96–106)
CO2: 27 mmol/L (ref 18–29)
Calcium: 9.3 mg/dL (ref 8.6–10.2)
Creatinine, Ser: 0.86 mg/dL (ref 0.76–1.27)
GFR, EST AFRICAN AMERICAN: 100 mL/min/{1.73_m2} (ref >59)
GFR, EST NON AFRICAN AMERICAN: 87 mL/min/{1.73_m2} (ref >59)
GLOBULIN, TOTAL: 2.4 g/dL (ref 1.5–4.5)
Glucose: 103 mg/dL — ABNORMAL HIGH (ref 65–99)
POTASSIUM: 4.2 mmol/L (ref 3.5–5.2)
Sodium: 136 mmol/L (ref 134–144)
TOTAL PROTEIN: 6.5 g/dL (ref 6.0–8.5)

## 2015-12-31 LAB — LIPID PANEL
CHOL/HDL RATIO: 3.4 ratio (ref 0.0–5.0)
Cholesterol, Total: 152 mg/dL (ref 100–199)
HDL: 45 mg/dL (ref >39)
LDL Calculated: 56 mg/dL (ref 0–99)
Triglycerides: 255 mg/dL — ABNORMAL HIGH (ref 0–149)
VLDL Cholesterol Cal: 51 mg/dL — ABNORMAL HIGH (ref 5–40)

## 2015-12-31 LAB — TSH: TSH: 2.82 u[IU]/mL (ref 0.450–4.500)

## 2016-01-03 ENCOUNTER — Telehealth: Payer: Self-pay

## 2016-01-03 NOTE — Telephone Encounter (Signed)
Left message for patient to call back  

## 2016-01-03 NOTE — Telephone Encounter (Signed)
-----   Message from Glean Hess, MD sent at 01/02/2016  4:49 PM EDT ----- Labs are normal except for mild increase in one liver test and blood sugar.  Continue same medication. Return for recheck in 6 months.

## 2016-01-04 NOTE — Telephone Encounter (Signed)
Spoke with pt. Pt. Advised.  

## 2016-02-20 ENCOUNTER — Other Ambulatory Visit: Payer: Self-pay | Admitting: Internal Medicine

## 2016-02-20 DIAGNOSIS — M19029 Primary osteoarthritis, unspecified elbow: Secondary | ICD-10-CM

## 2016-02-20 MED ORDER — MELOXICAM 7.5 MG PO TABS: 15.0000 mg | ORAL_TABLET | Freq: Every day | ORAL | Status: DC

## 2016-02-21 ENCOUNTER — Telehealth: Payer: Self-pay

## 2016-02-21 NOTE — Telephone Encounter (Signed)
Patient said [facility] states that we sent 30 and I called and it has been corrected for 60.

## 2016-05-16 DIAGNOSIS — L723 Sebaceous cyst: Secondary | ICD-10-CM | POA: Diagnosis not present

## 2016-07-09 ENCOUNTER — Telehealth: Payer: Self-pay

## 2016-07-09 ENCOUNTER — Telehealth: Payer: Self-pay | Admitting: Internal Medicine

## 2016-07-09 NOTE — Telephone Encounter (Signed)
Patient called about his Meloxicam stating he's supposed to take 1 bid - patient stated the pharmacy is only giving him 30 tabs making a 15 day supply.   Pharmacy: [facility] in [place]

## 2016-12-07 ENCOUNTER — Encounter: Payer: Self-pay | Admitting: Internal Medicine

## 2016-12-07 DIAGNOSIS — E538 Deficiency of other specified B group vitamins: Secondary | ICD-10-CM | POA: Insufficient documentation

## 2016-12-28 ENCOUNTER — Other Ambulatory Visit: Payer: Self-pay | Admitting: Internal Medicine

## 2016-12-28 DIAGNOSIS — K219 Gastro-esophageal reflux disease without esophagitis: Secondary | ICD-10-CM

## 2016-12-28 DIAGNOSIS — I10 Essential (primary) hypertension: Secondary | ICD-10-CM

## 2016-12-28 DIAGNOSIS — G4700 Insomnia, unspecified: Secondary | ICD-10-CM

## 2016-12-28 DIAGNOSIS — E782 Mixed hyperlipidemia: Secondary | ICD-10-CM

## 2017-01-07 ENCOUNTER — Encounter: Payer: Self-pay | Admitting: Internal Medicine

## 2017-01-07 ENCOUNTER — Ambulatory Visit (INDEPENDENT_AMBULATORY_CARE_PROVIDER_SITE_OTHER): Payer: Medicare Other | Admitting: Internal Medicine

## 2017-01-07 VITALS — BP 134/76 | HR 56 | Ht 70.0 in | Wt 170.0 lb

## 2017-01-07 DIAGNOSIS — I10 Essential (primary) hypertension: Secondary | ICD-10-CM

## 2017-01-07 DIAGNOSIS — K219 Gastro-esophageal reflux disease without esophagitis: Secondary | ICD-10-CM | POA: Diagnosis not present

## 2017-01-07 DIAGNOSIS — F17200 Nicotine dependence, unspecified, uncomplicated: Secondary | ICD-10-CM

## 2017-01-07 DIAGNOSIS — R7303 Prediabetes: Secondary | ICD-10-CM | POA: Insufficient documentation

## 2017-01-07 DIAGNOSIS — I6350 Cerebral infarction due to unspecified occlusion or stenosis of unspecified cerebral artery: Secondary | ICD-10-CM

## 2017-01-07 DIAGNOSIS — I739 Peripheral vascular disease, unspecified: Secondary | ICD-10-CM | POA: Diagnosis not present

## 2017-01-07 DIAGNOSIS — E782 Mixed hyperlipidemia: Secondary | ICD-10-CM | POA: Diagnosis not present

## 2017-01-07 DIAGNOSIS — M19029 Primary osteoarthritis, unspecified elbow: Secondary | ICD-10-CM | POA: Diagnosis not present

## 2017-01-07 DIAGNOSIS — R739 Hyperglycemia, unspecified: Secondary | ICD-10-CM | POA: Diagnosis not present

## 2017-01-07 DIAGNOSIS — G4700 Insomnia, unspecified: Secondary | ICD-10-CM | POA: Diagnosis not present

## 2017-01-07 MED ORDER — ATORVASTATIN CALCIUM 10 MG PO TABS: 10.0000 mg | ORAL_TABLET | Freq: Every day | ORAL | 12 refills | Status: DC

## 2017-01-07 MED ORDER — MELOXICAM 7.5 MG PO TABS: 15.0000 mg | ORAL_TABLET | Freq: Every day | ORAL | 12 refills | Status: DC

## 2017-01-07 MED ORDER — AMLODIPINE BESYLATE 10 MG PO TABS: 10.0000 mg | ORAL_TABLET | Freq: Every day | ORAL | 12 refills | Status: DC

## 2017-01-07 MED ORDER — QUINAPRIL HCL 40 MG PO TABS: 40.0000 mg | ORAL_TABLET | Freq: Every day | ORAL | 12 refills | Status: DC

## 2017-01-07 MED ORDER — METOPROLOL TARTRATE 25 MG PO TABS: 25.0000 mg | ORAL_TABLET | Freq: Two times a day (BID) | ORAL | 12 refills | Status: DC

## 2017-01-07 MED ORDER — TRAZODONE HCL 50 MG PO TABS: 50.0000 mg | ORAL_TABLET | Freq: Every day | ORAL | 12 refills | Status: DC

## 2017-01-07 MED ORDER — OMEPRAZOLE 40 MG PO CPDR: 40.0000 mg | DELAYED_RELEASE_CAPSULE | Freq: Every day | ORAL | 12 refills | Status: DC

## 2017-01-07 MED ORDER — CLONIDINE HCL 0.1 MG PO TABS: 0.1000 mg | ORAL_TABLET | Freq: Two times a day (BID) | ORAL | 12 refills | Status: DC

## 2017-01-07 NOTE — Progress Notes (Signed)
Date:  01/07/2017   Name:  Alexander Alvarado   DOB:  1941/04/01   MRN:  [id number]   Chief Complaint: Hyperlipidemia and Hypertension Hyperlipidemia  This is a chronic problem. The problem is controlled. Pertinent negatives include no chest pain or shortness of breath. Current antihyperlipidemic treatment includes statins.  Hypertension  This is a chronic problem. Pertinent negatives include no chest pain, headaches, palpitations or shortness of breath. Hypertensive end-organ damage includes CVA.  Gastroesophageal Reflux  He reports no abdominal pain, no chest pain, no coughing or no wheezing. Pertinent negatives include no fatigue. He has tried a PPI for the symptoms. The treatment provided significant relief.  CVA - stays at home.  He has a lift chair and a rolling walker to get around at home.  He never leaves home other than to come here.  He lives in a handicapped apartment.  He has a friend who helps him with groceries and transportation. PVD - no ulcers or wounds.  No LE pain.  Mild intermittent edema.  He continues to smoke 1 ppd and has no interest in quitting.   Review of Systems  Constitutional: Negative for appetite change, fatigue and unexpected weight change.  HENT: Positive for rhinorrhea.   Eyes: Negative for visual disturbance.  Respiratory: Negative for cough, shortness of breath and wheezing.   Cardiovascular: Negative for chest pain, palpitations and leg swelling.  Gastrointestinal: Negative for abdominal pain, blood in stool, constipation and diarrhea.  Genitourinary: Negative for dysuria and hematuria.  Musculoskeletal: Positive for gait problem (imbalance). Negative for arthralgias.  Skin: Positive for wound (on back of hand). Negative for color change and rash.  Neurological: Positive for weakness. Negative for tremors, numbness and headaches.  Hematological: Negative for adenopathy. Bruises/bleeds easily.  Psychiatric/Behavioral: Positive for sleep disturbance.  Negative for dysphoric mood.    Patient Active Problem List   Diagnosis Date Noted  . B12 nutritional deficiency 12/07/2016  . Folic acid deficiency 32/67/1245  . Cerebrovascular accident (CVA) due to occlusion of cerebral artery ([place]) 12/30/2015  . Senile ecchymosis 12/30/2015  . Essential (primary) hypertension 03/12/2015  . Gastro-esophageal reflux disease without esophagitis 03/12/2015  . Hyperlipidemia, mixed 03/12/2015  . Insomnia, persistent 03/12/2015  . Neoplasm of uncertain behavior of skin of hand 03/12/2015  . Arthritis, degenerative 03/12/2015  . Peripheral vascular disease ([place]) 03/12/2015  . Compulsive tobacco user syndrome 03/12/2015  . Cerebral infarction ([place]) 03/12/2015  . H/O alcohol abuse 10/29/2012    Prior to Admission medications   Medication Sig Start Date End Date Taking? Authorizing Provider  amLODipine (NORVASC) 10 MG tablet TAKE ONE TABLET BY MOUTH ONCE DAILY 12/28/16  Yes Glean Hess, MD  aspirin 81 MG tablet Take 1 tablet by mouth daily.    Yes Historical Provider, MD  atorvastatin (LIPITOR) 10 MG tablet TAKE ONE TABLET BY MOUTH AT BEDTIME 12/28/16  Yes Glean Hess, MD  cloNIDine (CATAPRES) 0.1 MG tablet TAKE ONE TABLET BY MOUTH TWICE DAILY 12/28/16  Yes Glean Hess, MD  Cyanocobalamin 1000 MCG CAPS Take 1 capsule by mouth daily.   Yes Historical Provider, MD  folic acid (FOLVITE) 1 MG tablet Take 1 tablet by mouth daily.   Yes Historical Provider, MD  meloxicam (MOBIC) 7.5 MG tablet Take 2 tablets (15 mg total) by mouth at bedtime. 02/20/16  Yes Glean Hess, MD  metoprolol tartrate (LOPRESSOR) 25 MG tablet TAKE ONE TABLET BY MOUTH TWICE DAILY 12/28/16  Yes Glean Hess, MD  omeprazole (  PRILOSEC) 40 MG capsule TAKE ONE CAPSULE BY MOUTH ONCE DAILY 12/28/16  Yes Glean Hess, MD  quinapril (ACCUPRIL) 40 MG tablet TAKE ONE TABLET BY MOUTH ONCE DAILY 12/28/16  Yes Glean Hess, MD  traZODone (DESYREL) 50 MG tablet TAKE TWO TABLETS BY MOUTH  ONCE DAILY 12/28/16  Yes Glean Hess, MD    No Known Allergies  Past Surgical History:  Procedure Laterality Date  . AMPUTATION Left 08/31/2015   Procedure: AMPUTATION DIGIT left 2nd toe ;  Surgeon: Samara Deist, DPM;  Location: [place];  Service: Podiatry;  Laterality: Left;  . AMPUTATION TOE Left 08/2015   second toe  . CHOLECYSTECTOMY    . LUMBAR DISC SURGERY    . VASCULAR SURGERY Left 2011   x 5 to left leg    Social History  Substance Use Topics  . Smoking status: Current Every Day Smoker    Packs/day: 1.00    Types: Cigarettes  . Smokeless tobacco: Never Used  . Alcohol use No     Medication list has been reviewed and updated.   Physical Exam  Constitutional: He is oriented to person, place, and time. He appears well-developed. No distress.  HENT:  Head: Normocephalic and atraumatic.  Neck: Normal range of motion. Carotid bruit is not present. No thyromegaly present.  Cardiovascular: Normal rate, regular rhythm, normal heart sounds and intact distal pulses.   No extrasystoles are present.  Pulmonary/Chest: Effort normal and breath sounds normal. No respiratory distress.  Abdominal: Soft. Bowel sounds are normal. There is no tenderness.  Musculoskeletal: Normal range of motion. He exhibits no edema or tenderness.  OA changes and stiffness in hands  Neurological: He is alert and oriented to person, place, and time. No sensory deficit.  Mild weakness in LLE  Skin: Skin is warm and dry. No rash noted.  Psychiatric: He has a normal mood and affect. His behavior is normal. Thought content normal.  Nursing note and vitals reviewed.   BP 134/76   Pulse (!) 56   Ht 5\' 10"  (1.778 m)   Wt 170 lb (77.1 kg)   BMI 24.39 kg/m   Assessment and Plan: 1. Essential (primary) hypertension controlled - Comprehensive metabolic panel - amLODipine (NORVASC) 10 MG tablet; Take 1 tablet (10 mg total) by mouth daily.  Dispense: 30 tablet; Refill: 12 - cloNIDine  (CATAPRES) 0.1 MG tablet; Take 1 tablet (0.1 mg total) by mouth 2 (two) times daily.  Dispense: 60 tablet; Refill: 12 - metoprolol tartrate (LOPRESSOR) 25 MG tablet; Take 1 tablet (25 mg total) by mouth 2 (two) times daily.  Dispense: 60 tablet; Refill: 12 - quinapril (ACCUPRIL) 40 MG tablet; Take 1 tablet (40 mg total) by mouth daily.  Dispense: 30 tablet; Refill: 12  2. Hyperlipidemia, mixed Continue current therapy - Lipid panel - atorvastatin (LIPITOR) 10 MG tablet; Take 1 tablet (10 mg total) by mouth at bedtime.  Dispense: 30 tablet; Refill: 12  3. Gastro-esophageal reflux disease without esophagitis stable - CBC with Differential/Platelet - omeprazole (PRILOSEC) 40 MG capsule; Take 1 capsule (40 mg total) by mouth daily.  Dispense: 30 capsule; Refill: 12  4. Insomnia, persistent Mild persistent - traZODone (DESYREL) 50 MG tablet; Take 1-2 tablets (50-100 mg total) by mouth at bedtime.  Dispense: 60 tablet; Refill: 12  5. Peripheral vascular disease ([place]) No current problmes  6. Cerebrovascular accident (CVA) due to occlusion of cerebral artery ([facility]) Stable residual  7. Hyperglycemia - Hemoglobin A1c - Lipid panel  8.  Primary osteoarthritis of elbow, unspecified laterality - meloxicam (MOBIC) 7.5 MG tablet; Take 2 tablets (15 mg total) by mouth at bedtime.  Dispense: 60 tablet; Refill: 12  9. Compulsive tobacco user syndrome Smoking cessation discussed - pt is not interested at this time   Meds ordered this encounter  Medications  . amLODipine (NORVASC) 10 MG tablet    Sig: Take 1 tablet (10 mg total) by mouth daily.    Dispense:  30 tablet    Refill:  12    Please consider 90 day supplies to promote better adherence  . atorvastatin (LIPITOR) 10 MG tablet    Sig: Take 1 tablet (10 mg total) by mouth at bedtime.    Dispense:  30 tablet    Refill:  12    Please consider 90 day supplies to promote better adherence  . cloNIDine (CATAPRES) 0.1 MG tablet    Sig: Take 1  tablet (0.1 mg total) by mouth 2 (two) times daily.    Dispense:  60 tablet    Refill:  12    Please consider 90 day supplies to promote better adherence  . meloxicam (MOBIC) 7.5 MG tablet    Sig: Take 2 tablets (15 mg total) by mouth at bedtime.    Dispense:  60 tablet    Refill:  12  . metoprolol tartrate (LOPRESSOR) 25 MG tablet    Sig: Take 1 tablet (25 mg total) by mouth 2 (two) times daily.    Dispense:  60 tablet    Refill:  12    Please consider 90 day supplies to promote better adherence  . omeprazole (PRILOSEC) 40 MG capsule    Sig: Take 1 capsule (40 mg total) by mouth daily.    Dispense:  30 capsule    Refill:  12    Please consider 90 day supplies to promote better adherence  . quinapril (ACCUPRIL) 40 MG tablet    Sig: Take 1 tablet (40 mg total) by mouth daily.    Dispense:  30 tablet    Refill:  12    Please consider 90 day supplies to promote better adherence  . traZODone (DESYREL) 50 MG tablet    Sig: Take 1-2 tablets (50-100 mg total) by mouth at bedtime.    Dispense:  60 tablet    Refill:  12    Please consider 90 day supplies to promote better adherence    Halina Maidens, MD [facility]  01/07/2017

## 2017-01-08 ENCOUNTER — Other Ambulatory Visit: Payer: Self-pay | Admitting: Internal Medicine

## 2017-01-08 DIAGNOSIS — E782 Mixed hyperlipidemia: Secondary | ICD-10-CM

## 2017-01-08 LAB — COMPREHENSIVE METABOLIC PANEL
ALBUMIN: 4.2 g/dL (ref 3.5–4.8)
ALK PHOS: 127 IU/L — AB (ref 39–117)
ALT: 11 IU/L (ref 0–44)
AST: 17 IU/L (ref 0–40)
Albumin/Globulin Ratio: 1.6 (ref 1.2–2.2)
BUN / CREAT RATIO: 11 (ref 10–24)
BUN: 9 mg/dL (ref 8–27)
Bilirubin Total: 0.8 mg/dL (ref 0.0–1.2)
CO2: 24 mmol/L (ref 18–29)
CREATININE: 0.84 mg/dL (ref 0.76–1.27)
Calcium: 9 mg/dL (ref 8.6–10.2)
Chloride: 95 mmol/L — ABNORMAL LOW (ref 96–106)
GFR calc non Af Amer: 87 mL/min/{1.73_m2} (ref >59)
GFR, EST AFRICAN AMERICAN: 100 mL/min/{1.73_m2} (ref >59)
GLUCOSE: 93 mg/dL (ref 65–99)
Globulin, Total: 2.6 g/dL (ref 1.5–4.5)
Potassium: 3.9 mmol/L (ref 3.5–5.2)
Sodium: 140 mmol/L (ref 134–144)
TOTAL PROTEIN: 6.8 g/dL (ref 6.0–8.5)

## 2017-01-08 LAB — CBC WITH DIFFERENTIAL/PLATELET
BASOS ABS: 0 10*3/uL (ref 0.0–0.2)
Basos: 0 %
EOS (ABSOLUTE): 0.1 10*3/uL (ref 0.0–0.4)
EOS: 1 %
HEMOGLOBIN: 19.9 g/dL — AB (ref 13.0–17.7)
Hematocrit: 57.6 % — ABNORMAL HIGH (ref 37.5–51.0)
IMMATURE GRANS (ABS): 0 10*3/uL (ref 0.0–0.1)
Immature Granulocytes: 0 %
LYMPHS ABS: 2.7 10*3/uL (ref 0.7–3.1)
Lymphs: 26 %
MCH: 32.9 pg (ref 26.6–33.0)
MCHC: 34.5 g/dL (ref 31.5–35.7)
MCV: 95 fL (ref 79–97)
MONOCYTES: 7 %
Monocytes Absolute: 0.8 10*3/uL (ref 0.1–0.9)
Neutrophils Absolute: 6.9 10*3/uL (ref 1.4–7.0)
Neutrophils: 66 %
Platelets: 155 10*3/uL (ref 150–379)
RBC: 6.04 x10E6/uL — AB (ref 4.14–5.80)
RDW: 14.9 % (ref 12.3–15.4)
WBC: 10.5 10*3/uL (ref 3.4–10.8)

## 2017-01-08 LAB — HEMOGLOBIN A1C
Est. average glucose Bld gHb Est-mCnc: 120 mg/dL
HEMOGLOBIN A1C: 5.8 % — AB (ref 4.8–5.6)

## 2017-01-08 LAB — LIPID PANEL
CHOL/HDL RATIO: 3.8 ratio (ref 0.0–5.0)
Cholesterol, Total: 150 mg/dL (ref 100–199)
HDL: 39 mg/dL — AB (ref >39)
LDL CALC: 54 mg/dL (ref 0–99)
Triglycerides: 287 mg/dL — ABNORMAL HIGH (ref 0–149)
VLDL CHOLESTEROL CAL: 57 mg/dL — AB (ref 5–40)

## 2017-01-08 MED ORDER — ATORVASTATIN CALCIUM 20 MG PO TABS: 20.0000 mg | ORAL_TABLET | Freq: Every day | ORAL | 12 refills | Status: DC

## 2017-01-08 NOTE — Progress Notes (Signed)
Informed pt of elevated blood count due to smoking. Told cholesterol is to high and to increase Atorvastatin to 20 mg. Wants this Rx sent to [facility] in [place].

## 2017-05-13 ENCOUNTER — Encounter: Payer: Medicare Other | Admitting: Internal Medicine

## 2017-05-13 ENCOUNTER — Other Ambulatory Visit: Payer: Self-pay

## 2017-05-13 NOTE — Progress Notes (Signed)
    Date:  05/13/2017   Name:  Alexander Alvarado   DOB:  [birth year]   MRN:  [id number]   Chief Complaint: Hypertension Hypertension  This is a chronic problem. Past treatments include calcium channel blockers, beta blockers and ACE inhibitors. The current treatment provides significant improvement.     Review of Systems  Patient Active Problem List   Diagnosis Date Noted  . Hyperglycemia 01/07/2017  . B12 nutritional deficiency 12/07/2016  . Folic acid deficiency 93/57/0177  . Cerebrovascular accident (CVA) due to occlusion of cerebral artery ([place]) 12/30/2015  . Senile ecchymosis 12/30/2015  . Essential (primary) hypertension 03/12/2015  . Gastro-esophageal reflux disease without esophagitis 03/12/2015  . Hyperlipidemia, mixed 03/12/2015  . Insomnia, persistent 03/12/2015  . Neoplasm of uncertain behavior of skin of hand 03/12/2015  . Arthritis, degenerative 03/12/2015  . Peripheral vascular disease ([place]) 03/12/2015  . Compulsive tobacco user syndrome 03/12/2015  . Cerebral infarction ([place]) 03/12/2015  . H/O alcohol abuse 10/29/2012    Prior to Admission medications   Medication Sig Start Date End Date Taking? Authorizing Provider  amLODipine (NORVASC) 10 MG tablet Take 1 tablet (10 mg total) by mouth daily. 01/07/17   Glean Hess, MD  aspirin 81 MG tablet Take 1 tablet by mouth daily.     [provider]  atorvastatin (LIPITOR) 20 MG tablet Take 1 tablet (20 mg total) by mouth at bedtime. 01/08/17   Glean Hess, MD  cloNIDine (CATAPRES) 0.1 MG tablet Take 1 tablet (0.1 mg total) by mouth 2 (two) times daily. 01/07/17   Glean Hess, MD  Cyanocobalamin 1000 MCG CAPS Take 1 capsule by mouth daily.    [provider]  folic acid (FOLVITE) 1 MG tablet Take 1 tablet by mouth daily.    [provider]  meloxicam (MOBIC) 7.5 MG tablet Take 2 tablets (15 mg total) by mouth at bedtime. 01/07/17   Glean Hess, MD  metoprolol tartrate  (LOPRESSOR) 25 MG tablet Take 1 tablet (25 mg total) by mouth 2 (two) times daily. 01/07/17   Glean Hess, MD  omeprazole (PRILOSEC) 40 MG capsule Take 1 capsule (40 mg total) by mouth daily. 01/07/17   Glean Hess, MD  quinapril (ACCUPRIL) 40 MG tablet Take 1 tablet (40 mg total) by mouth daily. 01/07/17   Glean Hess, MD  traZODone (DESYREL) 50 MG tablet Take 1-2 tablets (50-100 mg total) by mouth at bedtime. 01/07/17   Glean Hess, MD    No Known Allergies  Past Surgical History:  Procedure Laterality Date  . AMPUTATION Left 08/31/2015   Procedure: AMPUTATION DIGIT left 2nd toe ;  Surgeon: Samara Deist, DPM;  Location: [place];  Service: Podiatry;  Laterality: Left;  . AMPUTATION TOE Left 08/2015   second toe  . CHOLECYSTECTOMY    . LUMBAR DISC SURGERY    . VASCULAR SURGERY Left 2011   x 5 to left leg    Social History  Substance Use Topics  . Smoking status: Current Every Day Smoker    Packs/day: 1.00    Types: Cigarettes  . Smokeless tobacco: Never Used  . Alcohol use No     Medication list has been reviewed and updated.   Physical Exam  There were no vitals taken for this visit.  Assessment and Plan:  This encounter was created in error - please disregard.

## 2017-05-14 ENCOUNTER — Telehealth: Payer: Self-pay

## 2017-05-14 NOTE — Telephone Encounter (Signed)
Pt called back and left VM stating he was returning someone's call. Needs MAW visit scheduled per Dr Army Melia. Please Advise.

## 2017-05-15 NOTE — Telephone Encounter (Signed)
Left a message on patients voicemail yesterday.

## 2017-12-11 ENCOUNTER — Telehealth: Payer: Self-pay

## 2017-12-11 NOTE — Telephone Encounter (Signed)
Called pt to sched for AWV w/ [facility]. Currently has CPE scheduled w/ Dr. Army Melia on 01/08/18 @ 8:30am. Would like for pt to schedule his AWV prior to CPE, if able. LVM requesting returned call.

## 2017-12-13 ENCOUNTER — Telehealth: Payer: Self-pay

## 2017-12-13 NOTE — Telephone Encounter (Signed)
Called pt to sched AWV w/ [facility]. Currently has CPE scheduled w/ Dr. Army Melia on 01/08/18 @ 8:30am. Would like for pt to schedule his AWV PRIOR to CPE, if able. Unable to LVM d/t no answer.

## 2017-12-30 ENCOUNTER — Telehealth: Payer: Self-pay

## 2017-12-30 ENCOUNTER — Other Ambulatory Visit: Payer: Self-pay

## 2017-12-30 DIAGNOSIS — I10 Essential (primary) hypertension: Secondary | ICD-10-CM

## 2017-12-30 MED ORDER — AMLODIPINE BESYLATE 10 MG PO TABS: 10.0000 mg | ORAL_TABLET | Freq: Every day | ORAL | 0 refills | Status: DC

## 2017-12-30 NOTE — Telephone Encounter (Signed)
Must be seen for more refills last OV 12/30/2016

## 2018-01-08 ENCOUNTER — Encounter: Payer: Self-pay | Admitting: Internal Medicine

## 2018-01-08 ENCOUNTER — Ambulatory Visit (INDEPENDENT_AMBULATORY_CARE_PROVIDER_SITE_OTHER): Payer: Medicare HMO | Admitting: Internal Medicine

## 2018-01-08 VITALS — BP 122/64 | HR 54 | Ht 69.0 in | Wt 192.6 lb

## 2018-01-08 DIAGNOSIS — I739 Peripheral vascular disease, unspecified: Secondary | ICD-10-CM

## 2018-01-08 DIAGNOSIS — Z0000 Encounter for general adult medical examination without abnormal findings: Secondary | ICD-10-CM

## 2018-01-08 DIAGNOSIS — I6350 Cerebral infarction due to unspecified occlusion or stenosis of unspecified cerebral artery: Secondary | ICD-10-CM | POA: Diagnosis not present

## 2018-01-08 DIAGNOSIS — Z0001 Encounter for general adult medical examination with abnormal findings: Secondary | ICD-10-CM | POA: Diagnosis not present

## 2018-01-08 DIAGNOSIS — F17200 Nicotine dependence, unspecified, uncomplicated: Secondary | ICD-10-CM | POA: Diagnosis not present

## 2018-01-08 DIAGNOSIS — R739 Hyperglycemia, unspecified: Secondary | ICD-10-CM

## 2018-01-08 DIAGNOSIS — Z23 Encounter for immunization: Secondary | ICD-10-CM

## 2018-01-08 DIAGNOSIS — E782 Mixed hyperlipidemia: Secondary | ICD-10-CM

## 2018-01-08 DIAGNOSIS — G4700 Insomnia, unspecified: Secondary | ICD-10-CM

## 2018-01-08 DIAGNOSIS — M19029 Primary osteoarthritis, unspecified elbow: Secondary | ICD-10-CM

## 2018-01-08 DIAGNOSIS — K219 Gastro-esophageal reflux disease without esophagitis: Secondary | ICD-10-CM

## 2018-01-08 DIAGNOSIS — I10 Essential (primary) hypertension: Secondary | ICD-10-CM

## 2018-01-08 MED ORDER — ATORVASTATIN CALCIUM 20 MG PO TABS: 20.0000 mg | ORAL_TABLET | Freq: Every day | ORAL | 3 refills | Status: DC

## 2018-01-08 MED ORDER — CLONIDINE HCL 0.1 MG PO TABS: 0.1000 mg | ORAL_TABLET | Freq: Two times a day (BID) | ORAL | 3 refills | Status: DC

## 2018-01-08 MED ORDER — MELOXICAM 7.5 MG PO TABS: 15.0000 mg | ORAL_TABLET | Freq: Every day | ORAL | 3 refills | Status: DC

## 2018-01-08 MED ORDER — QUINAPRIL HCL 40 MG PO TABS: 40.0000 mg | ORAL_TABLET | Freq: Every day | ORAL | 3 refills | Status: DC

## 2018-01-08 MED ORDER — METOPROLOL TARTRATE 25 MG PO TABS: 25.0000 mg | ORAL_TABLET | Freq: Two times a day (BID) | ORAL | 3 refills | Status: DC

## 2018-01-08 MED ORDER — TRAZODONE HCL 50 MG PO TABS: 50.0000 mg | ORAL_TABLET | Freq: Every day | ORAL | 3 refills | Status: DC

## 2018-01-08 MED ORDER — OMEPRAZOLE 40 MG PO CPDR: 40.0000 mg | DELAYED_RELEASE_CAPSULE | Freq: Every day | ORAL | 3 refills | Status: DC

## 2018-01-08 MED ORDER — AMLODIPINE BESYLATE 10 MG PO TABS: 10.0000 mg | ORAL_TABLET | Freq: Every day | ORAL | 3 refills | Status: DC

## 2018-01-08 NOTE — Patient Instructions (Signed)
You should receive a call from [facility] ([facility]) to help with community resources.  Health Maintenance  Topic Date Due  . PNA vac Low Risk Adult (2 of 2 - PPSV23) Done today  . INFLUENZA VACCINE  08/22/2018 (Originally 05/22/2017)  . COLONOSCOPY  12/28/2024  . TETANUS/TDAP  12/29/2025

## 2018-01-08 NOTE — Progress Notes (Signed)
Patient: Alexander Alvarado, Male    DOB: 08-03-1941, 77 y.o.   MRN: [id number] Visit Date: 01/08/2018  Today's Provider: Halina Maidens, MD   Chief Complaint  Patient presents with  . Medicare Wellness    Needs all main meds refilled  . Hypertension  . Hyperlipidemia  . Depression    PHQ9- 17   Subjective:    Annual wellness visit Alexander Alvarado is a 77 y.o. male who presents today for his Subsequent Annual Wellness Visit. He feels poorly. He reports exercising none. He reports he is sleeping fairly well taking benadryl and trazodone.  He does not think that he can come here anymore - it is to taxing.  ----------------------------------------------------------- Hypertension  This is a chronic problem. Pertinent negatives include no chest pain, headaches, palpitations or shortness of breath. Past treatments include beta blockers, calcium channel blockers and central alpha agonists. The current treatment provides moderate improvement.  Gastroesophageal Reflux  He complains of heartburn. He reports no abdominal pain, no chest pain, no coughing or no wheezing. This is a recurrent problem. The problem occurs occasionally. Pertinent negatives include no fatigue. He has tried a PPI for the symptoms.  Hyperlipidemia  This is a chronic problem. The problem is controlled. Pertinent negatives include no chest pain or shortness of breath. Current antihyperlipidemic treatment includes statins.  CVA - having much more trouble getting around.  Walking with a walker today.  His balance is poor and he is very weak in the lower legs.  He has left arm weakness and left leg weakness and some OA as well.  No new weakness and no falls. Mood disorder/insomnia - he has not left his house until today for one year.  He spends his time in his recliner watching TV.  He has a friend who helps him with transportation when absolutely needed and bring him some food.  He does not cook and has a poor diet.  He admits to  some anxiety when he goes out since it is so seldom.  He is open to the idea of [place] [facility] assistance.  Review of Systems  Constitutional: Negative for chills, fatigue and fever.  HENT: Negative for dental problem and trouble swallowing.   Eyes: Negative for visual disturbance.  Respiratory: Negative for cough, chest tightness, shortness of breath and wheezing.   Cardiovascular: Positive for leg swelling. Negative for chest pain and palpitations.  Gastrointestinal: Positive for constipation and heartburn. Negative for abdominal pain and diarrhea.  Endocrine: Positive for polyuria.  Genitourinary: Positive for frequency. Negative for difficulty urinating, dysuria, hematuria and urgency.  Musculoskeletal: Positive for arthralgias and gait problem.  Skin: Negative for color change and rash.  Neurological: Positive for weakness. Negative for dizziness, seizures, speech difficulty, numbness and headaches.  Psychiatric/Behavioral: Positive for dysphoric mood and sleep disturbance. Negative for confusion, self-injury and suicidal ideas. The patient is nervous/anxious.     Social History   Socioeconomic History  . Marital status: Single    Spouse name: Not on file  . Number of children: Not on file  . Years of education: Not on file  . Highest education level: Not on file  Social Needs  . Financial resource strain: Not on file  . Food insecurity - worry: Not on file  . Food insecurity - inability: Not on file  . Transportation needs - medical: Not on file  . Transportation needs - non-medical: Not on file  Occupational History  . Not on file  Tobacco Use  .  Smoking status: Current Every Day Smoker    Packs/day: 0.25    Types: Cigarettes  . Smokeless tobacco: Never Used  . Tobacco comment: smokes 4-5 cigerettes a day ( cutting back )  Substance and Sexual Activity  . Alcohol use: No    Alcohol/week: 0.0 oz  . Drug use: No  . Sexual activity: Not Currently  Other Topics  Concern  . Not on file  Social History Narrative  . Not on file    Patient Active Problem List   Diagnosis Date Noted  . Hyperglycemia 01/07/2017  . B12 nutritional deficiency 12/07/2016  . Folic acid deficiency 65/68/1275  . Cerebrovascular accident (CVA) due to occlusion of cerebral artery ([place]) 12/30/2015  . Senile ecchymosis 12/30/2015  . Essential (primary) hypertension 03/12/2015  . Gastro-esophageal reflux disease without esophagitis 03/12/2015  . Hyperlipidemia, mixed 03/12/2015  . Insomnia, persistent 03/12/2015  . Neoplasm of uncertain behavior of skin of hand 03/12/2015  . Arthritis, degenerative 03/12/2015  . Peripheral vascular disease ([place]) 03/12/2015  . Compulsive tobacco user syndrome 03/12/2015  . H/O alcohol abuse 10/29/2012    Past Surgical History:  Procedure Laterality Date  . AMPUTATION Left 08/31/2015   Procedure: AMPUTATION DIGIT left 2nd toe ;  Surgeon: Samara Deist, DPM;  Location: [facility];  Service: Podiatry;  Laterality: Left;  . AMPUTATION TOE Left 08/2015   second toe  . CHOLECYSTECTOMY    . LUMBAR DISC SURGERY    . VASCULAR SURGERY Left 2011   x 5 to left leg    His family history includes CAD in his mother.     Current Meds  Medication Sig  . amLODipine (NORVASC) 10 MG tablet Take 1 tablet (10 mg total) by mouth daily.  Marland Kitchen aspirin 81 MG tablet Take 1 tablet by mouth daily.   Marland Kitchen atorvastatin (LIPITOR) 20 MG tablet Take 1 tablet (20 mg total) by mouth at bedtime.  . cloNIDine (CATAPRES) 0.1 MG tablet Take 1 tablet (0.1 mg total) by mouth 2 (two) times daily.  . Cyanocobalamin 1000 MCG CAPS Take 1 capsule by mouth daily.  . folic acid (FOLVITE) 1 MG tablet Take 1 tablet by mouth daily.  . meloxicam (MOBIC) 7.5 MG tablet Take 2 tablets (15 mg total) by mouth at bedtime.  . metoprolol tartrate (LOPRESSOR) 25 MG tablet Take 1 tablet (25 mg total) by mouth 2 (two) times daily.  Marland Kitchen omeprazole (PRILOSEC) 40 MG capsule Take 1 capsule (40  mg total) by mouth daily.  . quinapril (ACCUPRIL) 40 MG tablet Take 1 tablet (40 mg total) by mouth daily.  . traZODone (DESYREL) 50 MG tablet Take 1-2 tablets (50-100 mg total) by mouth at bedtime.  . [DISCONTINUED] amLODipine (NORVASC) 10 MG tablet Take 1 tablet (10 mg total) by mouth daily. Must make follow up appointment in order to get more refills.  . [DISCONTINUED] atorvastatin (LIPITOR) 20 MG tablet Take 1 tablet (20 mg total) by mouth at bedtime.  . [DISCONTINUED] cloNIDine (CATAPRES) 0.1 MG tablet Take 1 tablet (0.1 mg total) by mouth 2 (two) times daily.  . [DISCONTINUED] meloxicam (MOBIC) 7.5 MG tablet Take 2 tablets (15 mg total) by mouth at bedtime.  . [DISCONTINUED] metoprolol tartrate (LOPRESSOR) 25 MG tablet Take 1 tablet (25 mg total) by mouth 2 (two) times daily.  . [DISCONTINUED] omeprazole (PRILOSEC) 40 MG capsule Take 1 capsule (40 mg total) by mouth daily.  . [DISCONTINUED] quinapril (ACCUPRIL) 40 MG tablet Take 1 tablet (40 mg total) by mouth daily.  . [  DISCONTINUED] traZODone (DESYREL) 50 MG tablet Take 1-2 tablets (50-100 mg total) by mouth at bedtime.    Patient Care Team: Glean Hess, MD as PCP - General (Family Medicine) Samara Deist, DPM as Referring Physician (Podiatry)       Objective:   Vitals: BP 122/64   Pulse (!) 54   Ht 5\' 9"  (1.753 m)   Wt 192 lb 9.6 oz (87.4 kg)   SpO2 96%   BMI 28.44 kg/m   Physical Exam  Constitutional: He is oriented to person, place, and time. He appears well-developed. No distress.  HENT:  Head: Normocephalic and atraumatic.  Neck: Normal range of motion.  Cardiovascular: Normal rate, regular rhythm and normal heart sounds.  Pulmonary/Chest: Effort normal and breath sounds normal. No respiratory distress. He has no wheezes.  Abdominal: Soft. Bowel sounds are normal. He exhibits no distension. There is no tenderness. There is no guarding.  Musculoskeletal: Normal range of motion. He exhibits edema (1+ ankle  edema).  Neurological: He is alert and oriented to person, place, and time.  LUE 4/5 strength LLE 3+/5 hip flexors and dorsiflexion  Skin: Skin is warm and dry. No rash noted.  Psychiatric: His speech is normal and behavior is normal. Judgment and thought content normal. His affect is not inappropriate. He exhibits a depressed mood.  Nursing note and vitals reviewed.   Activities of Daily Living In your present state of health, do you have any difficulty performing the following activities: 01/08/2018 01/08/2018  Hearing? N N  Vision? N N  Difficulty concentrating or making decisions? N N  Walking or climbing stairs? Y N  Dressing or bathing? Y N  Doing errands, shopping? Y N  Preparing Food and eating ? Y -  Using the Toilet? N -  In the past six months, have you accidently leaked urine? N -  Do you have problems with loss of bowel control? N -  Managing your Medications? N -  Managing your Finances? N -  Housekeeping or managing your Housekeeping? Y -  Some recent data might be hidden    Fall Risk Assessment Fall Risk  01/08/2018 05/13/2017 12/30/2015 07/07/2015  Falls in the past year? No No Yes No  Comment - [facility]i Telephone Survey: data to providers prior to load - -  Number falls in past yr: - - 1 -  Injury with Fall? - - No -  Risk for fall due to : - - Impaired mobility;History of fall(s) -  Follow up - - Falls prevention discussed -     Depression Screen PHQ 2/9 Scores 01/08/2018 07/07/2015  PHQ - 2 Score 6 2  PHQ- 9 Score 17 12    6CIT Screen 01/08/2018  What Year? 0 points  What month? 0 points  What time? 0 points  Count back from 20 0 points  Months in reverse 0 points  Repeat phrase 2 points  Total Score 2    Medicare Annual Wellness Visit Summary:  Reviewed patient's Family Medical History Reviewed and updated list of patient's medical providers Assessment of cognitive impairment was done Assessed patient's functional ability Established a written  schedule for health screening [place] Completed and Reviewed  Exercise Activities and Dietary recommendations Goals    None      Immunization History  Administered Date(s) Administered  . Pneumococcal Conjugate-13 12/29/2014  . Tdap 12/30/2015    Health Maintenance  Topic Date Due  . PNA vac Low Risk Adult (2 of 2 -  PPSV23) 12/29/2015  . INFLUENZA VACCINE  08/22/2018 (Originally 05/22/2017)  . COLONOSCOPY  12/28/2024  . TETANUS/TDAP  12/29/2025    Discussed health benefits of physical activity, and encouraged him to engage in regular exercise appropriate for his age and condition.    ------------------------------------------------------------------------------------------------------------  Assessment & Plan:    1. Medicare annual wellness visit, subsequent Measures satisfied Mood disorder noted - discussed possible medication to help Will ask [facility] for psych nurse assessment  2. Cerebrovascular accident (CVA) due to occlusion of cerebral artery ([place]) Continue medication Pt is declining and needs help with community resources - AMB Referral to [facility]  3. Essential (primary) hypertension Fair control - CBC with Differential/Platelet - TSH - cloNIDine (CATAPRES) 0.1 MG tablet; Take 1 tablet (0.1 mg total) by mouth 2 (two) times daily.  Dispense: 180 tablet; Refill: 3 - amLODipine (NORVASC) 10 MG tablet; Take 1 tablet (10 mg total) by mouth daily.  Dispense: 90 tablet; Refill: 3 - metoprolol tartrate (LOPRESSOR) 25 MG tablet; Take 1 tablet (25 mg total) by mouth 2 (two) times daily.  Dispense: 180 tablet; Refill: 3 - quinapril (ACCUPRIL) 40 MG tablet; Take 1 tablet (40 mg total) by mouth daily.  Dispense: 90 tablet; Refill: 3  4. Peripheral vascular disease (HCC) Stable, activity limited so sx may be minimal  5. Hyperglycemia Check labs Diet is high in carbs and may need to be changed Consider MOW - pt tried in past and did not like  the food - Comprehensive metabolic panel - Hemoglobin A1c  6. Hyperlipidemia, mixed - Lipid panel - atorvastatin (LIPITOR) 20 MG tablet; Take 1 tablet (20 mg total) by mouth at bedtime.  Dispense: 90 tablet; Refill: 3  7. Compulsive tobacco user syndrome Continues to smoke - no interest quitting  8. Gastro-esophageal reflux disease without esophagitis Controlled on PPI - CBC with Differential/Platelet - omeprazole (PRILOSEC) 40 MG capsule; Take 1 capsule (40 mg total) by mouth daily.  Dispense: 90 capsule; Refill: 3  9. Primary osteoarthritis of elbow, unspecified laterality - meloxicam (MOBIC) 7.5 MG tablet; Take 2 tablets (15 mg total) by mouth at bedtime.  Dispense: 180 tablet; Refill: 3  10. Insomnia, persistent - traZODone (DESYREL) 50 MG tablet; Take 1-2 tablets (50-100 mg total) by mouth at bedtime.  Dispense: 180 tablet; Refill: 3  11. Need for pneumococcal vaccination - Pneumococcal polysaccharide vaccine 23-valent greater than or equal to 2yo subcutaneous/IM   Meds ordered this encounter  Medications  . cloNIDine (CATAPRES) 0.1 MG tablet    Sig: Take 1 tablet (0.1 mg total) by mouth 2 (two) times daily.    Dispense:  180 tablet    Refill:  3  . amLODipine (NORVASC) 10 MG tablet    Sig: Take 1 tablet (10 mg total) by mouth daily.    Dispense:  90 tablet    Refill:  3  . meloxicam (MOBIC) 7.5 MG tablet    Sig: Take 2 tablets (15 mg total) by mouth at bedtime.    Dispense:  180 tablet    Refill:  3  . atorvastatin (LIPITOR) 20 MG tablet    Sig: Take 1 tablet (20 mg total) by mouth at bedtime.    Dispense:  90 tablet    Refill:  3  . metoprolol tartrate (LOPRESSOR) 25 MG tablet    Sig: Take 1 tablet (25 mg total) by mouth 2 (two) times daily.    Dispense:  180 tablet    Refill:  3  . omeprazole (PRILOSEC)  40 MG capsule    Sig: Take 1 capsule (40 mg total) by mouth daily.    Dispense:  90 capsule    Refill:  3  . quinapril (ACCUPRIL) 40 MG tablet    Sig: Take  1 tablet (40 mg total) by mouth daily.    Dispense:  90 tablet    Refill:  3  . traZODone (DESYREL) 50 MG tablet    Sig: Take 1-2 tablets (50-100 mg total) by mouth at bedtime.    Dispense:  180 tablet    Refill:  3    Partially dictated using Editor, commissioning. Any errors are unintentional.  Halina Maidens, MD [facility]  01/08/2018

## 2018-01-09 LAB — COMPREHENSIVE METABOLIC PANEL
A/G RATIO: 1.8 (ref 1.2–2.2)
ALT: 16 IU/L (ref 0–44)
AST: 21 IU/L (ref 0–40)
Albumin: 4.6 g/dL (ref 3.5–4.8)
Alkaline Phosphatase: 137 IU/L — ABNORMAL HIGH (ref 39–117)
BUN/Creatinine Ratio: 12 (ref 10–24)
BUN: 11 mg/dL (ref 8–27)
Bilirubin Total: 0.7 mg/dL (ref 0.0–1.2)
CALCIUM: 9.8 mg/dL (ref 8.6–10.2)
CO2: 24 mmol/L (ref 20–29)
Chloride: 98 mmol/L (ref 96–106)
Creatinine, Ser: 0.89 mg/dL (ref 0.76–1.27)
GFR calc Af Amer: 97 mL/min/{1.73_m2} (ref >59)
GFR calc non Af Amer: 84 mL/min/{1.73_m2} (ref >59)
GLUCOSE: 112 mg/dL — AB (ref 65–99)
Globulin, Total: 2.6 g/dL (ref 1.5–4.5)
Potassium: 4.5 mmol/L (ref 3.5–5.2)
Sodium: 139 mmol/L (ref 134–144)
Total Protein: 7.2 g/dL (ref 6.0–8.5)

## 2018-01-09 LAB — CBC WITH DIFFERENTIAL/PLATELET
Basophils Absolute: 0 10*3/uL (ref 0.0–0.2)
Basos: 0 %
EOS (ABSOLUTE): 0 10*3/uL (ref 0.0–0.4)
EOS: 0 %
HEMOGLOBIN: 19.9 g/dL — AB (ref 13.0–17.7)
Hematocrit: 57.8 % — ABNORMAL HIGH (ref 37.5–51.0)
IMMATURE GRANS (ABS): 0 10*3/uL (ref 0.0–0.1)
Immature Granulocytes: 0 %
LYMPHS ABS: 2.6 10*3/uL (ref 0.7–3.1)
LYMPHS: 22 %
MCH: 31.3 pg (ref 26.6–33.0)
MCHC: 34.4 g/dL (ref 31.5–35.7)
MCV: 91 fL (ref 79–97)
MONOCYTES: 8 %
Monocytes Absolute: 0.9 10*3/uL (ref 0.1–0.9)
NEUTROS ABS: 8.2 10*3/uL — AB (ref 1.4–7.0)
Neutrophils: 70 %
PLATELETS: 154 10*3/uL (ref 150–379)
RBC: 6.36 x10E6/uL — ABNORMAL HIGH (ref 4.14–5.80)
RDW: 15 % (ref 12.3–15.4)
WBC: 11.8 10*3/uL — ABNORMAL HIGH (ref 3.4–10.8)

## 2018-01-09 LAB — HEMOGLOBIN A1C
ESTIMATED AVERAGE GLUCOSE: 134 mg/dL
Hgb A1c MFr Bld: 6.3 % — ABNORMAL HIGH (ref 4.8–5.6)

## 2018-01-09 LAB — LIPID PANEL
CHOLESTEROL TOTAL: 132 mg/dL (ref 100–199)
Chol/HDL Ratio: 3.7 ratio (ref 0.0–5.0)
HDL: 36 mg/dL — ABNORMAL LOW (ref >39)
LDL Calculated: 43 mg/dL (ref 0–99)
Triglycerides: 263 mg/dL — ABNORMAL HIGH (ref 0–149)
VLDL CHOLESTEROL CAL: 53 mg/dL — AB (ref 5–40)

## 2018-01-09 LAB — TSH: TSH: 3.5 u[IU]/mL (ref 0.450–4.500)

## 2018-01-13 ENCOUNTER — Other Ambulatory Visit: Payer: Self-pay

## 2018-01-13 NOTE — Patient Outreach (Signed)
[facility]) Care Management  01/13/2018  Alexander Alvarado Aug 16, 1941 [id number]   Referral Date: 01/08/18 Referral Source: MD Referral Referral Reason: Transportation, food, and care resources   Outreach Attempt #1 Spoke with patient. He is able to verify HIPAA.  Patient reports that he lives alone and is unable to do anything for himself.  He states he has a stroke some time ago and he is unable to get around good. He states he has a friend who takes him to appointments and gets him food sometimes. He states he really does not eat well because he sometimes can get up to make him something to eat.  Patient reports that he has a son who lives in [place] and a son who lives in [place] but are not helpful.   Patient states that he has problems bathing and dressing himself but does the best he can.  Patient reports some problems with depression from his condition but does take trazodone.  Conditions: Patient with previous stroke that left him with left sided weakness.  He reports that his stroke left him disabled.  Patient also reports HTN.    Medications: Patient reports that he takes his medications as prescribed and is able to afford his medication at this time.     Appointments:  Patient saw primary care physician on 01/08/18. Friend takes patient to appointments.     Advanced Directives: Patient reports that he has an advanced directive.  He states that his oldest son is his health care power of attorney.     Consent: Patient consents to social work referral for resources for food, transportation, and care needs.   Plan: RN CM will sign off and refer to social work.   Jone Baseman, RN, MSN [facility] Care Management Care Management Coordinator Direct Line [phone] Toll Free: [phone]  Fax: [phone]

## 2018-01-14 ENCOUNTER — Other Ambulatory Visit: Payer: Self-pay | Admitting: *Deleted

## 2018-01-14 NOTE — Patient Outreach (Signed)
Alexander Alvarado) Care Management  01/14/2018  TABER SWEETSER 1940-11-19 [id number]   Patient referred to this social worker by [facility] telephonic RNCM to assist with community resources for transportation, food and in home care. Per patient, he uses his walker when ambulating as his  left side is  "uselesss" following his stroke and has  poor balance. Patient states that he lives in a [place] apartment for [employer]) which  came equipped with a shower chair and grab bars in the bathroom. Patient states that he also has a lift chair. Patient has two son, one that lives in [place] and the other in [place].  He declined the possibility of living with them stting that he has a friend that looks after him. Per patient, his fried takes him to the grocery store, picls up his medicine if needed and transports him to his medical appointment. Patient he did receive Meals on Wheels at one time but stopped it because he did not like the meal choices. Patient discussed spending most of his time in his apartment, stating that he feels safest at home. Day programs reviewed to increase his socialization, however patient declined stating that he  feels safest in own chair and apartment. " I am  kind of a hermit and  used to being alone".  Patient agrees that he could benefit from more help in the home, however he cannot afford the  out of pocket expense for a in home aid. He does not qualify for medicaid.   He will continue to utilize his friend at this time to assist with his basic needs. Per patient, he can complete his own ADL's (slowly) manages his own finances and medications. This social worker's contact information provided if there are any future concerns or community resource needs.   Sheralyn Boatman [facility] Care Management [phone]

## 2018-07-07 DIAGNOSIS — I739 Peripheral vascular disease, unspecified: Secondary | ICD-10-CM | POA: Diagnosis not present

## 2018-07-07 DIAGNOSIS — B351 Tinea unguium: Secondary | ICD-10-CM | POA: Diagnosis not present

## 2018-07-07 DIAGNOSIS — Z89422 Acquired absence of other left toe(s): Secondary | ICD-10-CM | POA: Diagnosis not present

## 2019-01-09 ENCOUNTER — Telehealth: Payer: Self-pay | Admitting: Internal Medicine

## 2019-01-09 NOTE — Telephone Encounter (Signed)
Spoke with the patient and notified him that his appointment on Monday, March 23,2020 at 11:20 AM has been cancelled. Patient voice he understood, BUT  The patient states he is out of his medications and wanted to know what to do.  I notified the patient to follow the rule the office has for renewal of medications.  Patient still didn't know what to do.  I notified the patient to call [facility] office number [phone] to see what he needs to do.  Patient states he has enough to get him through the weekend and will call the office on Monday 01/12/2019.  Janace Hoard, Care Guide.

## 2019-01-11 NOTE — Telephone Encounter (Signed)
He has not been seen for one year.  He needs an OV and labs.

## 2019-01-12 ENCOUNTER — Ambulatory Visit: Payer: Self-pay

## 2019-01-12 ENCOUNTER — Other Ambulatory Visit: Payer: Self-pay

## 2019-01-12 DIAGNOSIS — G4700 Insomnia, unspecified: Secondary | ICD-10-CM

## 2019-01-12 DIAGNOSIS — I10 Essential (primary) hypertension: Secondary | ICD-10-CM

## 2019-01-12 DIAGNOSIS — M19029 Primary osteoarthritis, unspecified elbow: Secondary | ICD-10-CM

## 2019-01-12 DIAGNOSIS — K219 Gastro-esophageal reflux disease without esophagitis: Secondary | ICD-10-CM

## 2019-01-12 DIAGNOSIS — E782 Mixed hyperlipidemia: Secondary | ICD-10-CM

## 2019-01-12 MED ORDER — QUINAPRIL HCL 40 MG PO TABS: 40.0000 mg | ORAL_TABLET | Freq: Every day | ORAL | 0 refills | Status: DC

## 2019-01-12 MED ORDER — TRAZODONE HCL 50 MG PO TABS: 50.0000 mg | ORAL_TABLET | Freq: Every day | ORAL | 0 refills | Status: DC

## 2019-01-12 MED ORDER — CLONIDINE HCL 0.1 MG PO TABS: 0.1000 mg | ORAL_TABLET | Freq: Two times a day (BID) | ORAL | 0 refills | Status: DC

## 2019-01-12 MED ORDER — ATORVASTATIN CALCIUM 20 MG PO TABS: 20.0000 mg | ORAL_TABLET | Freq: Every day | ORAL | 0 refills | Status: DC

## 2019-01-12 MED ORDER — AMLODIPINE BESYLATE 10 MG PO TABS: 10.0000 mg | ORAL_TABLET | Freq: Every day | ORAL | 0 refills | Status: DC

## 2019-01-12 MED ORDER — METOPROLOL TARTRATE 25 MG PO TABS: 25.0000 mg | ORAL_TABLET | Freq: Two times a day (BID) | ORAL | 0 refills | Status: DC

## 2019-01-12 MED ORDER — MELOXICAM 7.5 MG PO TABS: 15.0000 mg | ORAL_TABLET | Freq: Every day | ORAL | 0 refills | Status: DC

## 2019-01-12 MED ORDER — OMEPRAZOLE 40 MG PO CPDR: 40.0000 mg | DELAYED_RELEASE_CAPSULE | Freq: Every day | ORAL | 0 refills | Status: DC

## 2019-01-12 NOTE — Telephone Encounter (Signed)
Sent patient refills. If he does not come in the next 30 days then he cannot continue to get refills until he does.

## 2019-01-12 NOTE — Telephone Encounter (Signed)
Patient scheduled an appt for 4/21 to come in for med refills.  Told him we can give him 30 days for now.

## 2019-01-26 ENCOUNTER — Telehealth: Payer: Self-pay

## 2019-01-26 NOTE — Telephone Encounter (Signed)
Attempted to reach patient regarding need for AWV and available to complete via video chat. Unable to reach patient or leave message. If pt calls back please confirm eligibility to complete virtual visit via video or reschedule to be seen in the office later in the year.   Virtual Medicare Annual wellness visits scheduled tentatively only through May 2020 during COVID-19 pandemic.     Thank you!

## 2019-01-28 ENCOUNTER — Telehealth: Payer: Self-pay | Admitting: Internal Medicine

## 2019-01-28 NOTE — Telephone Encounter (Signed)
Called to RE-schedule Medicare Annual Wellness Visit with Nurse Health Advisor.  ° °If patient returns call, please schedule AWV with [facility] ~~AFTER June 1ST~~ ° ° °For any questions please contact:  °Kathryn Brown [phone] or skype at: [email or web address]  ° ° °

## 2019-02-10 ENCOUNTER — Encounter: Payer: Self-pay | Admitting: Internal Medicine

## 2019-02-10 ENCOUNTER — Other Ambulatory Visit: Payer: Self-pay

## 2019-02-10 ENCOUNTER — Ambulatory Visit (INDEPENDENT_AMBULATORY_CARE_PROVIDER_SITE_OTHER): Payer: Medicare HMO | Admitting: Internal Medicine

## 2019-02-10 VITALS — BP 128/74 | HR 86 | Ht 69.0 in | Wt 187.0 lb

## 2019-02-10 DIAGNOSIS — I10 Essential (primary) hypertension: Secondary | ICD-10-CM | POA: Diagnosis not present

## 2019-02-10 DIAGNOSIS — G4700 Insomnia, unspecified: Secondary | ICD-10-CM

## 2019-02-10 DIAGNOSIS — M19029 Primary osteoarthritis, unspecified elbow: Secondary | ICD-10-CM

## 2019-02-10 DIAGNOSIS — E782 Mixed hyperlipidemia: Secondary | ICD-10-CM | POA: Diagnosis not present

## 2019-02-10 DIAGNOSIS — Z0000 Encounter for general adult medical examination without abnormal findings: Secondary | ICD-10-CM | POA: Diagnosis not present

## 2019-02-10 DIAGNOSIS — I739 Peripheral vascular disease, unspecified: Secondary | ICD-10-CM

## 2019-02-10 DIAGNOSIS — F321 Major depressive disorder, single episode, moderate: Secondary | ICD-10-CM

## 2019-02-10 DIAGNOSIS — R7303 Prediabetes: Secondary | ICD-10-CM | POA: Diagnosis not present

## 2019-02-10 DIAGNOSIS — S98132A Complete traumatic amputation of one left lesser toe, initial encounter: Secondary | ICD-10-CM | POA: Diagnosis not present

## 2019-02-10 DIAGNOSIS — K219 Gastro-esophageal reflux disease without esophagitis: Secondary | ICD-10-CM

## 2019-02-10 DIAGNOSIS — I6350 Cerebral infarction due to unspecified occlusion or stenosis of unspecified cerebral artery: Secondary | ICD-10-CM | POA: Diagnosis not present

## 2019-02-10 LAB — POCT URINALYSIS DIPSTICK
Bilirubin, UA: NEGATIVE
Blood, UA: NEGATIVE
Clarity, UA: NEGATIVE
Glucose, UA: NEGATIVE
Ketones, UA: NEGATIVE
Leukocytes, UA: NEGATIVE
Nitrite, UA: NEGATIVE
Protein, UA: NEGATIVE
Spec Grav, UA: 1.01 (ref 1.010–1.025)
Urobilinogen, UA: 0.2 E.U./dL
pH, UA: 6 (ref 5.0–8.0)

## 2019-02-10 MED ORDER — TRAZODONE HCL 50 MG PO TABS: 50.0000 mg | ORAL_TABLET | Freq: Every day | ORAL | 1 refills | Status: DC

## 2019-02-10 MED ORDER — CLONIDINE HCL 0.1 MG PO TABS: 0.1000 mg | ORAL_TABLET | Freq: Two times a day (BID) | ORAL | 1 refills | Status: DC

## 2019-02-10 MED ORDER — QUINAPRIL HCL 40 MG PO TABS: 40.0000 mg | ORAL_TABLET | Freq: Every day | ORAL | 1 refills | Status: DC

## 2019-02-10 MED ORDER — AMLODIPINE BESYLATE 10 MG PO TABS: 10.0000 mg | ORAL_TABLET | Freq: Every day | ORAL | 1 refills | Status: DC

## 2019-02-10 MED ORDER — METOPROLOL TARTRATE 25 MG PO TABS: 25.0000 mg | ORAL_TABLET | Freq: Two times a day (BID) | ORAL | 1 refills | Status: DC

## 2019-02-10 MED ORDER — ATORVASTATIN CALCIUM 20 MG PO TABS: 20.0000 mg | ORAL_TABLET | Freq: Every day | ORAL | 1 refills | Status: DC

## 2019-02-10 MED ORDER — MELOXICAM 7.5 MG PO TABS: 15.0000 mg | ORAL_TABLET | Freq: Every day | ORAL | 0 refills | Status: DC

## 2019-02-10 MED ORDER — OMEPRAZOLE 40 MG PO CPDR: 40.0000 mg | DELAYED_RELEASE_CAPSULE | Freq: Every day | ORAL | 1 refills | Status: DC

## 2019-02-10 NOTE — Patient Instructions (Signed)
This information is directly available on the CDC website: https://www.cdc.gov/coronavirus/2019-ncov/if-you-are-sick/steps-when-sick.html    Source:CDC Reference to specific commercial products, manufacturers, companies, or trademarks does not constitute its endorsement or recommendation by the U.S. Government, Department of Health and Human Services, or Centers for Disease Control and Prevention.  

## 2019-02-10 NOTE — Progress Notes (Signed)
Date:  02/10/2019   Name:  Alexander Alvarado   DOB:  [birth year]   MRN:  [id number]   Chief Complaint: Diabetes; Hypertension; and Depression (PHQ9- 21 ) Alexander Alvarado is a 78 y.o. male who presents today for his Complete Annual Exam. He feels poorly. He reports exercising none due to CVA. He reports he is sleeping fairly well. He is very depressed, does not feel like he had any life worth living.  He has not left his house since last year.  Physicially he has no complaints other than left sided weakness. Colonoscopy done in 2016 PPV-23 and Prevnar-13 done  Hypertension  This is a chronic problem. The problem is controlled. Pertinent negatives include no chest pain, headaches, palpitations or shortness of breath. Hypertensive end-organ damage includes PVD.  Gastroesophageal Reflux  He complains of abdominal pain and heartburn. He reports no chest pain, no coughing or no wheezing. The problem occurs occasionally. Associated symptoms include fatigue. Risk factors include smoking/tobacco exposure. He has tried a PPI for the symptoms.  Hyperlipidemia  Pertinent negatives include no chest pain, myalgias or shortness of breath. Current antihyperlipidemic treatment includes statins. The current treatment provides significant improvement of lipids.  Nicotine Dependence  Presents for follow-up visit. Symptoms include fatigue and insomnia. His urge triggers include company of smokers.  Insomnia  Primary symptoms: sleep disturbance.  The problem occurs nightly. PMH includes: depression.  Depression         This is a chronic problem.  Associated symptoms include fatigue, insomnia and decreased interest.  Associated symptoms include not irritable, no restlessness, no body aches, no myalgias, no headaches and no suicidal ideas.     The symptoms are aggravated by social issues.  Past treatments include nothing. CVA - stable left sided weakness.  Uses walker to ambulate.  Able to do his ADLs independently.   He has a friend who helps him get food, medication and transportation. That is becoming more difficult. PVD - s/p toe amputation on left and several left LE stents.  Lab Results  Component Value Date   CREATININE 0.89 01/08/2018   BUN 11 01/08/2018   NA 139 01/08/2018   K 4.5 01/08/2018   CL 98 01/08/2018   CO2 24 01/08/2018   Lab Results  Component Value Date   HGBA1C 6.3 (H) 01/08/2018   Lab Results  Component Value Date   CHOL 132 01/08/2018   HDL 36 (L) 01/08/2018   LDLCALC 43 01/08/2018   TRIG 263 (H) 01/08/2018   CHOLHDL 3.7 01/08/2018     Review of Systems  Constitutional: Positive for fatigue. Negative for chills, fever and unexpected weight change.  HENT: Negative for trouble swallowing.   Eyes: Negative for visual disturbance.  Respiratory: Negative for cough, chest tightness, shortness of breath and wheezing.   Cardiovascular: Negative for chest pain, palpitations and leg swelling.  Gastrointestinal: Positive for abdominal pain, constipation, diarrhea and heartburn.  Genitourinary: Negative for difficulty urinating.  Musculoskeletal: Positive for gait problem. Negative for myalgias.  Allergic/Immunologic: Negative for environmental allergies.  Neurological: Positive for weakness (left sided). Negative for dizziness, speech difficulty, numbness and headaches.  Hematological: Negative for adenopathy.  Psychiatric/Behavioral: Positive for depression, dysphoric mood and sleep disturbance. Negative for suicidal ideas. The patient has insomnia. The patient is not nervous/anxious.     Patient Active Problem List   Diagnosis Date Noted  . Amputation of toe of left foot ([place]) 02/10/2019  . Pre-diabetes 01/07/2017  . B12 nutritional deficiency 12/07/2016  .  Folic acid deficiency 66/59/9357  . Cerebrovascular accident (CVA) due to occlusion of cerebral artery ([place]) 12/30/2015  . Senile ecchymosis 12/30/2015  . Essential (primary) hypertension 03/12/2015  .  Gastro-esophageal reflux disease without esophagitis 03/12/2015  . Hyperlipidemia, mixed 03/12/2015  . Insomnia, persistent 03/12/2015  . Neoplasm of uncertain behavior of skin of hand 03/12/2015  . Arthritis, degenerative 03/12/2015  . Peripheral vascular disease ([place]) 03/12/2015  . Compulsive tobacco user syndrome 03/12/2015  . H/O alcohol abuse 10/29/2012    No Known Allergies  Past Surgical History:  Procedure Laterality Date  . AMPUTATION Left 08/31/2015   Procedure: AMPUTATION DIGIT left 2nd toe ;  Surgeon: Samara Deist, DPM;  Location: [facility];  Service: Podiatry;  Laterality: Left;  . AMPUTATION TOE Left 08/2015   second toe  . CHOLECYSTECTOMY    . LUMBAR DISC SURGERY    . VASCULAR SURGERY Left 2011   x 5 to left leg    Social History   Tobacco Use  . Smoking status: Current Every Day Smoker    Packs/day: 0.25    Types: Cigarettes  . Smokeless tobacco: Never Used  . Tobacco comment: smokes 8 cigerettes a day ( cutting back )  Substance Use Topics  . Alcohol use: No    Alcohol/week: 0.0 standard drinks  . Drug use: No     Medication list has been reviewed and updated.  Current Meds  Medication Sig  . amLODipine (NORVASC) 10 MG tablet Take 1 tablet (10 mg total) by mouth daily.  Marland Kitchen aspirin 81 MG tablet Take 1 tablet by mouth daily.   Marland Kitchen atorvastatin (LIPITOR) 20 MG tablet Take 1 tablet (20 mg total) by mouth at bedtime.  . cloNIDine (CATAPRES) 0.1 MG tablet Take 1 tablet (0.1 mg total) by mouth 2 (two) times daily.  . Cyanocobalamin 1000 MCG CAPS Take 1 capsule by mouth daily.  . folic acid (FOLVITE) 1 MG tablet Take 1 tablet by mouth daily.  . meloxicam (MOBIC) 7.5 MG tablet Take 2 tablets (15 mg total) by mouth at bedtime.  . metoprolol tartrate (LOPRESSOR) 25 MG tablet Take 1 tablet (25 mg total) by mouth 2 (two) times daily.  Marland Kitchen omeprazole (PRILOSEC) 40 MG capsule Take 1 capsule (40 mg total) by mouth daily.  . quinapril (ACCUPRIL) 40 MG tablet  Take 1 tablet (40 mg total) by mouth daily.  . traZODone (DESYREL) 50 MG tablet Take 1-2 tablets (50-100 mg total) by mouth at bedtime.    PHQ 2/9 Scores 02/10/2019 01/13/2018 01/08/2018 07/07/2015  PHQ - 2 Score 6 6 6 2   PHQ- 9 Score 21 20 17 12     BP Readings from Last 3 Encounters:  02/10/19 128/74  01/08/18 122/64  01/07/17 134/76    Physical Exam Vitals signs and nursing note reviewed.  Constitutional:      General: He is not irritable.He is not in acute distress.    Appearance: He is well-developed.  HENT:     Head: Normocephalic and atraumatic.     Mouth/Throat:     Mouth: Mucous membranes are moist.  Neck:     Musculoskeletal: Normal range of motion and neck supple.     Vascular: No carotid bruit.  Cardiovascular:     Rate and Rhythm: Normal rate and regular rhythm.     Pulses: Normal pulses.  Pulmonary:     Effort: Pulmonary effort is normal. No respiratory distress.     Breath sounds: Normal breath sounds. No wheezing or rales.  Abdominal:  Palpations: There is no mass.     Tenderness: There is no abdominal tenderness. There is no guarding.  Musculoskeletal:     Right lower leg: Edema (3+) present.     Left lower leg: Edema (1+) present.  Lymphadenopathy:     Cervical: No cervical adenopathy.  Skin:    General: Skin is warm and dry.     Findings: No rash.  Neurological:     Mental Status: He is alert and oriented to person, place, and time.     Motor: Weakness (left side UE and LE) present.     Gait: Gait abnormal (uses walker).     Comments: 4/5 LUE 4/5 LLE  Psychiatric:        Behavior: Behavior normal.        Thought Content: Thought content normal.     Wt Readings from Last 3 Encounters:  02/10/19 187 lb (84.8 kg)  01/08/18 192 lb 9.6 oz (87.4 kg)  01/07/17 170 lb (77.1 kg)    BP 128/74   Pulse 86   Ht 5\' 9"  (1.753 m)   Wt 187 lb (84.8 kg)   SpO2 95%   BMI 27.62 kg/m   Assessment and Plan: 1. Annual physical exam Discussed DNR and  living will.  Pt states he has a living will and desires a DNR form for home  2. Essential (primary) hypertension controlled - CBC with Differential/Platelet - Comprehensive metabolic panel - POCT urinalysis dipstick - amLODipine (NORVASC) 10 MG tablet; Take 1 tablet (10 mg total) by mouth daily.  Dispense: 90 tablet; Refill: 1 - cloNIDine (CATAPRES) 0.1 MG tablet; Take 1 tablet (0.1 mg total) by mouth 2 (two) times daily.  Dispense: 180 tablet; Refill: 1 - metoprolol tartrate (LOPRESSOR) 25 MG tablet; Take 1 tablet (25 mg total) by mouth 2 (two) times daily.  Dispense: 180 tablet; Refill: 1 - quinapril (ACCUPRIL) 40 MG tablet; Take 1 tablet (40 mg total) by mouth daily.  Dispense: 90 tablet; Refill: 1  3. Peripheral vascular disease (HCC) Stable, limited acitivity  4. Hyperlipidemia, mixed On statin therapy - Lipid panel - atorvastatin (LIPITOR) 20 MG tablet; Take 1 tablet (20 mg total) by mouth at bedtime.  Dispense: 90 tablet; Refill: 1  5. Insomnia, persistent chronic - traZODone (DESYREL) 50 MG tablet; Take 1-2 tablets (50-100 mg total) by mouth at bedtime.  Dispense: 180 tablet; Refill: 1  6. Cerebrovascular accident (CVA) due to occlusion of cerebral artery (HCC) No worsening of sx Continue aspirin  7. Amputation of toe of left foot ([place]) unchanged  8. Pre-diabetes Check labs - Hemoglobin A1c  9. Primary osteoarthritis of elbow, unspecified laterality Continue nsaids prn - meloxicam (MOBIC) 7.5 MG tablet; Take 2 tablets (15 mg total) by mouth at bedtime.  Dispense: 60 tablet; Refill: 0  10. Gastro-esophageal reflux disease without esophagitis Controlled in PPI - omeprazole (PRILOSEC) 40 MG capsule; Take 1 capsule (40 mg total) by mouth daily.  Dispense: 90 capsule; Refill: 1  11. Current moderate episode of major depressive disorder without prior episode ([place]) Pt declines treatment - his judgement is intact and he is advised that he can return if he decides he would  like medication    Partially dictated using Editor, commissioning. Any errors are unintentional.  Halina Maidens, MD [facility]  02/10/2019

## 2019-02-11 LAB — CBC WITH DIFFERENTIAL/PLATELET
Basophils Absolute: 0.1 10*3/uL (ref 0.0–0.2)
Basos: 1 %
EOS (ABSOLUTE): 0 10*3/uL (ref 0.0–0.4)
Eos: 0 %
Hematocrit: 59.9 % — ABNORMAL HIGH (ref 37.5–51.0)
Hemoglobin: 20.6 g/dL (ref 13.0–17.7)
Immature Grans (Abs): 0 10*3/uL (ref 0.0–0.1)
Immature Granulocytes: 0 %
Lymphocytes Absolute: 2.6 10*3/uL (ref 0.7–3.1)
Lymphs: 22 %
MCH: 32.3 pg (ref 26.6–33.0)
MCHC: 34.4 g/dL (ref 31.5–35.7)
MCV: 94 fL (ref 79–97)
Monocytes Absolute: 0.7 10*3/uL (ref 0.1–0.9)
Monocytes: 6 %
Neutrophils Absolute: 8.2 10*3/uL — ABNORMAL HIGH (ref 1.4–7.0)
Neutrophils: 71 %
Platelets: 189 10*3/uL (ref 150–450)
RBC: 6.38 x10E6/uL — ABNORMAL HIGH (ref 4.14–5.80)
RDW: 15.8 % — ABNORMAL HIGH (ref 11.6–15.4)
WBC: 11.7 10*3/uL — ABNORMAL HIGH (ref 3.4–10.8)

## 2019-02-11 LAB — HEMOGLOBIN A1C
Est. average glucose Bld gHb Est-mCnc: 140 mg/dL
Hgb A1c MFr Bld: 6.5 % — ABNORMAL HIGH (ref 4.8–5.6)

## 2019-02-11 LAB — COMPREHENSIVE METABOLIC PANEL WITH GFR
ALT: 16 [IU]/L (ref 0–44)
AST: 20 [IU]/L (ref 0–40)
Albumin/Globulin Ratio: 1.6 (ref 1.2–2.2)
Albumin: 4.5 g/dL (ref 3.7–4.7)
Alkaline Phosphatase: 129 [IU]/L — ABNORMAL HIGH (ref 39–117)
BUN/Creatinine Ratio: 9 — ABNORMAL LOW (ref 10–24)
BUN: 9 mg/dL (ref 8–27)
Bilirubin Total: 0.8 mg/dL (ref 0.0–1.2)
CO2: 24 mmol/L (ref 20–29)
Calcium: 10 mg/dL (ref 8.6–10.2)
Chloride: 95 mmol/L — ABNORMAL LOW (ref 96–106)
Creatinine, Ser: 1.01 mg/dL (ref 0.76–1.27)
GFR calc Af Amer: 84 mL/min/{1.73_m2}
GFR calc non Af Amer: 72 mL/min/{1.73_m2}
Globulin, Total: 2.9 g/dL (ref 1.5–4.5)
Glucose: 114 mg/dL — ABNORMAL HIGH (ref 65–99)
Potassium: 4.1 mmol/L (ref 3.5–5.2)
Sodium: 138 mmol/L (ref 134–144)
Total Protein: 7.4 g/dL (ref 6.0–8.5)

## 2019-02-11 LAB — LIPID PANEL
Chol/HDL Ratio: 4.4 ratio (ref 0.0–5.0)
Cholesterol, Total: 137 mg/dL (ref 100–199)
HDL: 31 mg/dL — ABNORMAL LOW
LDL Calculated: 32 mg/dL (ref 0–99)
Triglycerides: 368 mg/dL — ABNORMAL HIGH (ref 0–149)
VLDL Cholesterol Cal: 74 mg/dL — ABNORMAL HIGH (ref 5–40)

## 2019-02-11 NOTE — Telephone Encounter (Signed)
Spoke to pt he declined AWV with [facility]

## 2019-03-06 ENCOUNTER — Telehealth: Payer: Self-pay

## 2019-03-06 NOTE — Telephone Encounter (Signed)
Patient called saying he was told he only has to come in once yearly now because he has a very hard time getting out of his home and walking now. Said Dr. Army Melia only sent in 6 months for his RFs and he needs a whole year. I told him he will need to call me when he is running low after 5 more months and I will send in 6 more months as long as Dr. Army Melia approves it.   He said he verbalized understanding and will call when running low.

## 2019-04-03 ENCOUNTER — Other Ambulatory Visit: Payer: Self-pay | Admitting: Internal Medicine

## 2019-04-03 DIAGNOSIS — M19029 Primary osteoarthritis, unspecified elbow: Secondary | ICD-10-CM

## 2019-04-20 DIAGNOSIS — B351 Tinea unguium: Secondary | ICD-10-CM | POA: Diagnosis not present

## 2019-04-20 DIAGNOSIS — Z89422 Acquired absence of other left toe(s): Secondary | ICD-10-CM | POA: Diagnosis not present

## 2019-04-20 DIAGNOSIS — I739 Peripheral vascular disease, unspecified: Secondary | ICD-10-CM | POA: Diagnosis not present

## 2019-05-08 ENCOUNTER — Other Ambulatory Visit: Payer: Self-pay | Admitting: Internal Medicine

## 2019-05-08 DIAGNOSIS — M19029 Primary osteoarthritis, unspecified elbow: Secondary | ICD-10-CM

## 2019-06-24 ENCOUNTER — Other Ambulatory Visit: Payer: Self-pay | Admitting: Internal Medicine

## 2019-06-24 DIAGNOSIS — M19029 Primary osteoarthritis, unspecified elbow: Secondary | ICD-10-CM

## 2019-08-24 ENCOUNTER — Other Ambulatory Visit: Payer: Self-pay | Admitting: Internal Medicine

## 2019-08-24 DIAGNOSIS — M19029 Primary osteoarthritis, unspecified elbow: Secondary | ICD-10-CM

## 2019-09-10 ENCOUNTER — Emergency Department: Payer: Medicare HMO

## 2019-09-10 ENCOUNTER — Other Ambulatory Visit: Payer: Self-pay

## 2019-09-10 ENCOUNTER — Inpatient Hospital Stay
Admission: EM | Admit: 2019-09-10 | Discharge: 2019-09-16 | DRG: 521 | Disposition: A | Discharge status: Skilled Nursing Facility | Payer: Medicare HMO | Attending: Internal Medicine | Admitting: Internal Medicine

## 2019-09-10 DIAGNOSIS — S72091A Other fracture of head and neck of right femur, initial encounter for closed fracture: Secondary | ICD-10-CM | POA: Diagnosis not present

## 2019-09-10 DIAGNOSIS — D72829 Elevated white blood cell count, unspecified: Secondary | ICD-10-CM | POA: Diagnosis not present

## 2019-09-10 DIAGNOSIS — J9621 Acute and chronic respiratory failure with hypoxia: Secondary | ICD-10-CM

## 2019-09-10 DIAGNOSIS — W19XXXA Unspecified fall, initial encounter: Secondary | ICD-10-CM | POA: Diagnosis not present

## 2019-09-10 DIAGNOSIS — Z791 Long term (current) use of non-steroidal anti-inflammatories (NSAID): Secondary | ICD-10-CM | POA: Diagnosis not present

## 2019-09-10 DIAGNOSIS — R000 Tachycardia, unspecified: Secondary | ICD-10-CM | POA: Diagnosis not present

## 2019-09-10 DIAGNOSIS — K219 Gastro-esophageal reflux disease without esophagitis: Secondary | ICD-10-CM | POA: Diagnosis not present

## 2019-09-10 DIAGNOSIS — Z66 Do not resuscitate: Secondary | ICD-10-CM | POA: Diagnosis present

## 2019-09-10 DIAGNOSIS — M25551 Pain in right hip: Secondary | ICD-10-CM | POA: Diagnosis present

## 2019-09-10 DIAGNOSIS — F329 Major depressive disorder, single episode, unspecified: Secondary | ICD-10-CM | POA: Diagnosis not present

## 2019-09-10 DIAGNOSIS — R001 Bradycardia, unspecified: Secondary | ICD-10-CM | POA: Diagnosis not present

## 2019-09-10 DIAGNOSIS — E7800 Pure hypercholesterolemia, unspecified: Secondary | ICD-10-CM | POA: Diagnosis present

## 2019-09-10 DIAGNOSIS — Z96641 Presence of right artificial hip joint: Secondary | ICD-10-CM | POA: Diagnosis not present

## 2019-09-10 DIAGNOSIS — M6281 Muscle weakness (generalized): Secondary | ICD-10-CM | POA: Diagnosis not present

## 2019-09-10 DIAGNOSIS — J690 Pneumonitis due to inhalation of food and vomit: Secondary | ICD-10-CM | POA: Diagnosis not present

## 2019-09-10 DIAGNOSIS — I6350 Cerebral infarction due to unspecified occlusion or stenosis of unspecified cerebral artery: Secondary | ICD-10-CM | POA: Diagnosis not present

## 2019-09-10 DIAGNOSIS — Y92009 Unspecified place in unspecified non-institutional (private) residence as the place of occurrence of the external cause: Secondary | ICD-10-CM | POA: Diagnosis not present

## 2019-09-10 DIAGNOSIS — Z79899 Other long term (current) drug therapy: Secondary | ICD-10-CM | POA: Diagnosis not present

## 2019-09-10 DIAGNOSIS — Z23 Encounter for immunization: Secondary | ICD-10-CM

## 2019-09-10 DIAGNOSIS — G4700 Insomnia, unspecified: Secondary | ICD-10-CM | POA: Diagnosis present

## 2019-09-10 DIAGNOSIS — T17908A Unspecified foreign body in respiratory tract, part unspecified causing other injury, initial encounter: Secondary | ICD-10-CM

## 2019-09-10 DIAGNOSIS — S0990XA Unspecified injury of head, initial encounter: Secondary | ICD-10-CM | POA: Diagnosis not present

## 2019-09-10 DIAGNOSIS — F17210 Nicotine dependence, cigarettes, uncomplicated: Secondary | ICD-10-CM | POA: Diagnosis not present

## 2019-09-10 DIAGNOSIS — S72001D Fracture of unspecified part of neck of right femur, subsequent encounter for closed fracture with routine healing: Secondary | ICD-10-CM | POA: Diagnosis not present

## 2019-09-10 DIAGNOSIS — S98132A Complete traumatic amputation of one left lesser toe, initial encounter: Secondary | ICD-10-CM | POA: Diagnosis not present

## 2019-09-10 DIAGNOSIS — J9601 Acute respiratory failure with hypoxia: Secondary | ICD-10-CM | POA: Diagnosis not present

## 2019-09-10 DIAGNOSIS — I69354 Hemiplegia and hemiparesis following cerebral infarction affecting left non-dominant side: Secondary | ICD-10-CM

## 2019-09-10 DIAGNOSIS — R079 Chest pain, unspecified: Secondary | ICD-10-CM | POA: Diagnosis not present

## 2019-09-10 DIAGNOSIS — I10 Essential (primary) hypertension: Secondary | ICD-10-CM | POA: Diagnosis not present

## 2019-09-10 DIAGNOSIS — M2550 Pain in unspecified joint: Secondary | ICD-10-CM | POA: Diagnosis not present

## 2019-09-10 DIAGNOSIS — R262 Difficulty in walking, not elsewhere classified: Secondary | ICD-10-CM | POA: Diagnosis not present

## 2019-09-10 DIAGNOSIS — J984 Other disorders of lung: Secondary | ICD-10-CM | POA: Diagnosis not present

## 2019-09-10 DIAGNOSIS — I739 Peripheral vascular disease, unspecified: Secondary | ICD-10-CM | POA: Diagnosis present

## 2019-09-10 DIAGNOSIS — Z419 Encounter for procedure for purposes other than remedying health state, unspecified: Secondary | ICD-10-CM

## 2019-09-10 DIAGNOSIS — Z8673 Personal history of transient ischemic attack (TIA), and cerebral infarction without residual deficits: Secondary | ICD-10-CM | POA: Diagnosis not present

## 2019-09-10 DIAGNOSIS — W010XXA Fall on same level from slipping, tripping and stumbling without subsequent striking against object, initial encounter: Secondary | ICD-10-CM | POA: Diagnosis present

## 2019-09-10 DIAGNOSIS — D751 Secondary polycythemia: Secondary | ICD-10-CM | POA: Diagnosis present

## 2019-09-10 DIAGNOSIS — Z96649 Presence of unspecified artificial hip joint: Secondary | ICD-10-CM

## 2019-09-10 DIAGNOSIS — R0902 Hypoxemia: Secondary | ICD-10-CM | POA: Diagnosis not present

## 2019-09-10 DIAGNOSIS — R498 Other voice and resonance disorders: Secondary | ICD-10-CM | POA: Diagnosis not present

## 2019-09-10 DIAGNOSIS — Z471 Aftercare following joint replacement surgery: Secondary | ICD-10-CM | POA: Diagnosis not present

## 2019-09-10 DIAGNOSIS — Z20828 Contact with and (suspected) exposure to other viral communicable diseases: Secondary | ICD-10-CM | POA: Diagnosis not present

## 2019-09-10 DIAGNOSIS — S72041A Displaced fracture of base of neck of right femur, initial encounter for closed fracture: Secondary | ICD-10-CM | POA: Diagnosis not present

## 2019-09-10 DIAGNOSIS — E782 Mixed hyperlipidemia: Secondary | ICD-10-CM | POA: Diagnosis not present

## 2019-09-10 DIAGNOSIS — S72009A Fracture of unspecified part of neck of unspecified femur, initial encounter for closed fracture: Secondary | ICD-10-CM

## 2019-09-10 DIAGNOSIS — Z7982 Long term (current) use of aspirin: Secondary | ICD-10-CM

## 2019-09-10 DIAGNOSIS — R488 Other symbolic dysfunctions: Secondary | ICD-10-CM | POA: Diagnosis not present

## 2019-09-10 DIAGNOSIS — S72001S Fracture of unspecified part of neck of right femur, sequela: Secondary | ICD-10-CM | POA: Diagnosis not present

## 2019-09-10 DIAGNOSIS — S199XXA Unspecified injury of neck, initial encounter: Secondary | ICD-10-CM | POA: Diagnosis not present

## 2019-09-10 DIAGNOSIS — E785 Hyperlipidemia, unspecified: Secondary | ICD-10-CM | POA: Diagnosis not present

## 2019-09-10 DIAGNOSIS — S7291XA Unspecified fracture of right femur, initial encounter for closed fracture: Secondary | ICD-10-CM | POA: Diagnosis not present

## 2019-09-10 DIAGNOSIS — S72011A Unspecified intracapsular fracture of right femur, initial encounter for closed fracture: Secondary | ICD-10-CM | POA: Diagnosis not present

## 2019-09-10 DIAGNOSIS — Z03818 Encounter for observation for suspected exposure to other biological agents ruled out: Secondary | ICD-10-CM | POA: Diagnosis not present

## 2019-09-10 DIAGNOSIS — S72001A Fracture of unspecified part of neck of right femur, initial encounter for closed fracture: Secondary | ICD-10-CM

## 2019-09-10 DIAGNOSIS — I2510 Atherosclerotic heart disease of native coronary artery without angina pectoris: Secondary | ICD-10-CM | POA: Diagnosis not present

## 2019-09-10 DIAGNOSIS — Z4789 Encounter for other orthopedic aftercare: Secondary | ICD-10-CM | POA: Diagnosis not present

## 2019-09-10 DIAGNOSIS — R1312 Dysphagia, oropharyngeal phase: Secondary | ICD-10-CM | POA: Diagnosis not present

## 2019-09-10 DIAGNOSIS — S299XXA Unspecified injury of thorax, initial encounter: Secondary | ICD-10-CM | POA: Diagnosis not present

## 2019-09-10 DIAGNOSIS — Z7401 Bed confinement status: Secondary | ICD-10-CM | POA: Diagnosis not present

## 2019-09-10 DIAGNOSIS — S72009D Fracture of unspecified part of neck of unspecified femur, subsequent encounter for closed fracture with routine healing: Secondary | ICD-10-CM | POA: Diagnosis not present

## 2019-09-10 LAB — CBC WITH DIFFERENTIAL/PLATELET
Abs Immature Granulocytes: 0.13 10*3/uL — ABNORMAL HIGH (ref 0.00–0.07)
Basophils Absolute: 0.1 10*3/uL (ref 0.0–0.1)
Basophils Relative: 1 %
Eosinophils Absolute: 0 10*3/uL (ref 0.0–0.5)
Eosinophils Relative: 0 %
HCT: 59.1 % — ABNORMAL HIGH (ref 39.0–52.0)
Hemoglobin: 20.6 g/dL — ABNORMAL HIGH (ref 13.0–17.0)
Immature Granulocytes: 1 %
Lymphocytes Relative: 9 %
Lymphs Abs: 2 10*3/uL (ref 0.7–4.0)
MCH: 31.8 pg (ref 26.0–34.0)
MCHC: 34.9 g/dL (ref 30.0–36.0)
MCV: 91.2 fL (ref 80.0–100.0)
Monocytes Absolute: 1.4 10*3/uL — ABNORMAL HIGH (ref 0.1–1.0)
Monocytes Relative: 7 %
Neutro Abs: 18.2 10*3/uL — ABNORMAL HIGH (ref 1.7–7.7)
Neutrophils Relative %: 82 %
Platelets: 171 10*3/uL (ref 150–400)
RBC: 6.48 MIL/uL — ABNORMAL HIGH (ref 4.22–5.81)
RDW: 12.8 % (ref 11.5–15.5)
WBC: 21.9 10*3/uL — ABNORMAL HIGH (ref 4.0–10.5)
nRBC: 0 % (ref 0.0–0.2)

## 2019-09-10 LAB — COMPREHENSIVE METABOLIC PANEL
ALT: 19 U/L (ref 0–44)
AST: 32 U/L (ref 15–41)
Albumin: 4.3 g/dL (ref 3.5–5.0)
Alkaline Phosphatase: 138 U/L — ABNORMAL HIGH (ref 38–126)
Anion gap: 12 (ref 5–15)
BUN: 12 mg/dL (ref 8–23)
CO2: 25 mmol/L (ref 22–32)
Calcium: 9.3 mg/dL (ref 8.9–10.3)
Chloride: 99 mmol/L (ref 98–111)
Creatinine, Ser: 0.81 mg/dL (ref 0.61–1.24)
GFR calc Af Amer: >60 mL/min (ref >60)
GFR calc non Af Amer: >60 mL/min (ref >60)
Glucose, Bld: 118 mg/dL — ABNORMAL HIGH (ref 70–99)
Potassium: 3.6 mmol/L (ref 3.5–5.1)
Sodium: 136 mmol/L (ref 135–145)
Total Bilirubin: 1.5 mg/dL — ABNORMAL HIGH (ref 0.3–1.2)
Total Protein: 7.6 g/dL (ref 6.5–8.1)

## 2019-09-10 LAB — TYPE AND SCREEN
ABO/RH(D): O NEG
Antibody Screen: NEGATIVE

## 2019-09-10 LAB — PROTIME-INR
INR: 1.1 (ref 0.8–1.2)
Prothrombin Time: 14.3 seconds (ref 11.4–15.2)

## 2019-09-10 LAB — SARS CORONAVIRUS 2 (TAT 6-24 HRS): SARS Coronavirus 2: NEGATIVE

## 2019-09-10 LAB — ABO/RH: ABO/RH(D): O NEG

## 2019-09-10 MED ORDER — HYDROCODONE-ACETAMINOPHEN 5-325 MG PO TABS
1.0000 | ORAL_TABLET | Freq: Four times a day (QID) | ORAL | Status: DC | PRN
  Administered 2019-09-11: 2 via ORAL
  Filled 2019-09-10: qty 2

## 2019-09-10 MED ORDER — TRAZODONE HCL 50 MG PO TABS
50.0000 mg | ORAL_TABLET | Freq: Every day | ORAL | Status: DC
  Administered 2019-09-11: 50 mg via ORAL
  Filled 2019-09-10: qty 2

## 2019-09-10 MED ORDER — ACETAMINOPHEN 325 MG PO TABS: 650.0000 mg | ORAL_TABLET | ORAL | Status: DC | PRN

## 2019-09-10 MED ORDER — VITAMIN B-12 1000 MCG PO TABS
1000.0000 ug | ORAL_TABLET | Freq: Every day | ORAL | Status: DC
  Administered 2019-09-12 – 2019-09-16 (×5): 1000 ug via ORAL
  Filled 2019-09-10 (×5): qty 1

## 2019-09-10 MED ORDER — ATORVASTATIN CALCIUM 20 MG PO TABS
20.0000 mg | ORAL_TABLET | Freq: Every day | ORAL | Status: DC
  Administered 2019-09-11 – 2019-09-15 (×5): 20 mg via ORAL
  Filled 2019-09-10 (×5): qty 1

## 2019-09-10 MED ORDER — FOLIC ACID 1 MG PO TABS
1.0000 mg | ORAL_TABLET | Freq: Every day | ORAL | Status: DC
  Administered 2019-09-12 – 2019-09-16 (×5): 1 mg via ORAL
  Filled 2019-09-10 (×5): qty 1

## 2019-09-10 MED ORDER — PANTOPRAZOLE SODIUM 40 MG PO TBEC
40.0000 mg | DELAYED_RELEASE_TABLET | Freq: Every day | ORAL | Status: DC
  Administered 2019-09-12 – 2019-09-16 (×5): 40 mg via ORAL
  Filled 2019-09-10 (×5): qty 1

## 2019-09-10 MED ORDER — SODIUM CHLORIDE 0.9 % IV SOLN
Freq: Once | INTRAVENOUS | Status: AC
  Administered 2019-09-10: 18:00:00 via INTRAVENOUS

## 2019-09-10 MED ORDER — CEFAZOLIN SODIUM-DEXTROSE 2-4 GM/100ML-% IV SOLN
2.0000 g | Freq: Three times a day (TID) | INTRAVENOUS | Status: DC
  Administered 2019-09-10 – 2019-09-11 (×3): 2 g via INTRAVENOUS
  Filled 2019-09-10 (×6): qty 100

## 2019-09-10 MED ORDER — MORPHINE SULFATE (PF) 4 MG/ML IV SOLN
4.0000 mg | INTRAVENOUS | Status: DC | PRN
  Administered 2019-09-10: 4 mg via INTRAVENOUS
  Filled 2019-09-10: qty 1

## 2019-09-10 MED ORDER — METOPROLOL TARTRATE 25 MG PO TABS
25.0000 mg | ORAL_TABLET | Freq: Two times a day (BID) | ORAL | Status: DC
  Administered 2019-09-11 – 2019-09-15 (×9): 25 mg via ORAL
  Filled 2019-09-10 (×9): qty 1

## 2019-09-10 MED ORDER — PNEUMOCOCCAL VAC POLYVALENT 25 MCG/0.5ML IJ INJ
0.5000 mL | INJECTION | INTRAMUSCULAR | Status: AC
  Administered 2019-09-12: 0.5 mL via INTRAMUSCULAR
  Filled 2019-09-10: qty 0.5

## 2019-09-10 MED ORDER — MORPHINE SULFATE (PF) 2 MG/ML IV SOLN: 0.5000 mg | INTRAVENOUS | Status: DC | PRN

## 2019-09-10 MED ORDER — AMLODIPINE BESYLATE 10 MG PO TABS
10.0000 mg | ORAL_TABLET | Freq: Every day | ORAL | Status: DC
  Administered 2019-09-12 – 2019-09-16 (×5): 10 mg via ORAL
  Filled 2019-09-10 (×5): qty 1

## 2019-09-10 MED ORDER — CLONIDINE HCL 0.1 MG PO TABS
0.1000 mg | ORAL_TABLET | Freq: Two times a day (BID) | ORAL | Status: DC
  Administered 2019-09-11 – 2019-09-15 (×8): 0.1 mg via ORAL
  Filled 2019-09-10 (×8): qty 1

## 2019-09-10 MED ORDER — MORPHINE SULFATE (PF) 4 MG/ML IV SOLN
4.0000 mg | Freq: Once | INTRAVENOUS | Status: AC
  Administered 2019-09-10: 4 mg via INTRAVENOUS
  Filled 2019-09-10: qty 1

## 2019-09-10 MED ORDER — SENNA 8.6 MG PO TABS
1.0000 | ORAL_TABLET | Freq: Two times a day (BID) | ORAL | Status: DC
  Administered 2019-09-10: 8.6 mg via ORAL
  Filled 2019-09-10: qty 1

## 2019-09-10 MED ORDER — INFLUENZA VAC A&B SA ADJ QUAD 0.5 ML IM PRSY
0.5000 mL | PREFILLED_SYRINGE | INTRAMUSCULAR | Status: DC
  Filled 2019-09-10 (×2): qty 0.5

## 2019-09-10 MED ORDER — QUINAPRIL HCL 10 MG PO TABS
40.0000 mg | ORAL_TABLET | Freq: Every day | ORAL | Status: DC
  Filled 2019-09-10: qty 4

## 2019-09-10 MED ORDER — ONDANSETRON HCL 4 MG/2ML IJ SOLN
4.0000 mg | Freq: Once | INTRAMUSCULAR | Status: AC
  Administered 2019-09-10: 4 mg via INTRAVENOUS
  Filled 2019-09-10: qty 2

## 2019-09-10 NOTE — H&P (Signed)
History and Physical    Alexander Alvarado [id number] DOB: 23-Oct-1940 DOA: 09/10/2019  PCP: Glean Hess, MD  Patient coming from: Home, pt lives alone  I have personally briefly reviewed patient's old medical records in [place]  Chief Complaint: Fall  HPI: Alexander Alvarado is a 78 y.o. male with medical history significant of CVA with residual left-sided weakness, peripheral vascular disease s/p left foot toe amputation, hypertension hyperlipidemia who presents status post mechanical fall.  Patient reports that he was just bending over to pick something up and lost his balance.  He was unable to get up for 30 minutes but finally was able to call his son who told him to call EMS.  Patient was using his walker at that time.  Denies any loss of consciousness.  He denies any frequent falls but has left-sided weakness from previous CVA.  Right now he complains of right-sided hip pain of about 3 out of 10 after receiving 4 mg of IV morphine in the ED.  He denies any dizziness, lightheadedness, chest pain or shortness of breath prior to the fall.  Reports that he was in his otherwise normal state of health. Endorse a few cigarettes/day use, alcohol or illicit drug use.  ED Course: He was afebrile, intermittently hypertensive up to 160s over 90s on room air.  CBC shows leukocytosis of 21.9 and hemoglobin of 20.6 which appears to be his baseline.  CMP showed glucose of 118, creatinine of 0.81, alkaline phosphatase of 138, mild elevated total bilirubin of 1.5. CT head shows no acute intracranial abnormalities. CT cervical spine shows no acute spine fracture. CT hip shows impacted fracture of the proximal right femoral neck.  Review of Systems: Constitutional: No Weight Change, No Fever ENT/Mouth: No sore throat, No Rhinorrhea Eyes: No Eye Pain, No Vision Changes Cardiovascular: No Chest Pain, no SOB Respiratory: No Cough, No Sputum, No Wheezing, no Dyspnea  Gastrointestinal: No  Nausea, No Vomiting, No Diarrhea, No Constipation, No Pain Genitourinary: no Urinary Incontinence, No Urgency, No Flank Pain Musculoskeletal: No Arthralgias, No Myalgias Skin: No Skin Lesions, No Pruritus, Neuro: no Weakness, No Numbness,  No Loss of Consciousness, No Syncope Psych: No Anxiety/Panic, No Depression, no decrease appetite Heme/Lymph: No Bruising, No Bleeding  Past Medical History:  Diagnosis Date   Depression    GERD (gastroesophageal reflux disease)    High cholesterol    Hypertension    PVD (peripheral vascular disease) (HCC)    Stroke (HCC)    left side weakness    Past Surgical History:  Procedure Laterality Date   AMPUTATION Left 08/31/2015   Procedure: AMPUTATION DIGIT left 2nd toe ;  Surgeon: Samara Deist, DPM;  Location: [facility];  Service: Podiatry;  Laterality: Left;   AMPUTATION TOE Left 08/2015   second toe   CHOLECYSTECTOMY     LUMBAR DISC SURGERY     VASCULAR SURGERY Left 2011   x 5 to left leg     reports that he has been smoking cigarettes. He has been smoking about 0.25 packs per day. He has never used smokeless tobacco. He reports that he does not drink alcohol or use drugs.  No Known Allergies  Family History  Problem Relation Age of Onset   CAD Mother      Prior to Admission medications   Medication Sig Start Date End Date Taking? Authorizing Provider  amLODipine (NORVASC) 10 MG tablet Take 1 tablet (10 mg total) by mouth daily. 02/10/19   Army Melia,  Jesse Sans, MD  aspirin 81 MG tablet Take 1 tablet by mouth daily.     [provider]  atorvastatin (LIPITOR) 20 MG tablet Take 1 tablet (20 mg total) by mouth at bedtime. 02/10/19   Glean Hess, MD  cloNIDine (CATAPRES) 0.1 MG tablet Take 1 tablet (0.1 mg total) by mouth 2 (two) times daily. 02/10/19   Glean Hess, MD  Cyanocobalamin 1000 MCG CAPS Take 1 capsule by mouth daily.    [provider]  folic acid (FOLVITE) 1 MG tablet Take 1  tablet by mouth daily.    [provider]  meloxicam (MOBIC) 7.5 MG tablet TAKE 2 TABLETS BY MOUTH AT BEDTIME 08/24/19   Glean Hess, MD  metoprolol tartrate (LOPRESSOR) 25 MG tablet Take 1 tablet (25 mg total) by mouth 2 (two) times daily. 02/10/19   Glean Hess, MD  omeprazole (PRILOSEC) 40 MG capsule Take 1 capsule (40 mg total) by mouth daily. 02/10/19   Glean Hess, MD  quinapril (ACCUPRIL) 40 MG tablet Take 1 tablet (40 mg total) by mouth daily. 02/10/19   Glean Hess, MD  traZODone (DESYREL) 50 MG tablet Take 1-2 tablets (50-100 mg total) by mouth at bedtime. 02/10/19   Glean Hess, MD    Physical Exam: Vitals:   09/10/19 1517 09/10/19 1532  BP:  (!) 166/93  Pulse:  86  Resp:  16  SpO2:  92%  Weight: 77.1 kg   Height: 5\' 11"  (1.803 m)     Constitutional: NAD, calm, comfortable nontoxic-appearing elderly gentleman sitting up in bed at 30 degree incline Vitals:   09/10/19 1517 09/10/19 1532  BP:  (!) 166/93  Pulse:  86  Resp:  16  SpO2:  92%  Weight: 77.1 kg   Height: 5\' 11"  (1.803 m)    Eyes: PERRL, lids and conjunctivae normal ENMT: Mucous membranes are moist. Posterior pharynx clear of any exudate or lesions. Neck: normal, supple, no masses,  Respiratory: clear to auscultation bilaterally, no wheezing, no crackles. Normal respiratory effort. No accessory muscle use.  Cardiovascular: Regular rate and rhythm, no murmurs / rubs / gallops. No extremity edema. 2+ pedal pulses.  Abdomen: no tenderness, no masses palpated.  Bowel sounds positive.  Musculoskeletal: no clubbing / cyanosis. No joint deformity upper and lower extremities. no contractures. Normal muscle tone.  No ecchymosis overlying right hip area.  There is mild tenderness to palpation of the right lateral hip. Skin: no rashes, lesions, ulcers. No induration Neurologic: CN 2-12 grossly intact.  Patient endorsed decreased sensation of the left foot which is chronic status post  previous CVA.  Strength 5 out of 5 of the upper extremity.  4/5 of the left lower extremity.  Patient can only minimally bend right upper extremity secondary to intolerance to pain  psychiatric: Normal judgment and insight. Alert and oriented x 3. Normal mood.     Labs on Admission: I have personally reviewed following labs and imaging studies  CBC: Recent Labs  Lab 09/10/19 1703  WBC 21.9*  NEUTROABS 18.2*  HGB 20.6*  HCT 59.1*  MCV 91.2  PLT [id number]   Basic Metabolic Panel: Recent Labs  Lab 09/10/19 1703  NA 136  K 3.6  CL 99  CO2 25  GLUCOSE 118*  BUN 12  CREATININE 0.81  CALCIUM 9.3   GFR: Estimated Creatinine Clearance: 82.6 mL/min (by C-G formula based on SCr of 0.81 mg/dL). Liver Function Tests: Recent Labs  Lab 09/10/19 1703  AST 32  ALT 19  ALKPHOS 138*  BILITOT 1.5*  PROT 7.6  ALBUMIN 4.3   No results for input(s): LIPASE, AMYLASE in the last 168 hours. No results for input(s): AMMONIA in the last 168 hours. Coagulation Profile: Recent Labs  Lab 09/10/19 1703  INR 1.1   Cardiac Enzymes: No results for input(s): CKTOTAL, CKMB, CKMBINDEX, TROPONINI in the last 168 hours. BNP (last 3 results) No results for input(s): PROBNP in the last 8760 hours. HbA1C: No results for input(s): HGBA1C in the last 72 hours. CBG: No results for input(s): GLUCAP in the last 168 hours. Lipid Profile: No results for input(s): CHOL, HDL, LDLCALC, TRIG, CHOLHDL, LDLDIRECT in the last 72 hours. Thyroid Function Tests: No results for input(s): TSH, T4TOTAL, FREET4, T3FREE, THYROIDAB in the last 72 hours. Anemia Panel: No results for input(s): VITAMINB12, FOLATE, FERRITIN, TIBC, IRON, RETICCTPCT in the last 72 hours. Urine analysis:    Component Value Date/Time   COLORURINE Yellow 07/14/2012 2232   APPEARANCEUR Hazy 07/14/2012 2232   LABSPEC 1.024 07/14/2012 2232   PHURINE 6.0 07/14/2012 2232   GLUCOSEU Negative 07/14/2012 2232   HGBUR Negative 07/14/2012 2232    BILIRUBINUR neg 02/10/2019 0847   BILIRUBINUR Negative 07/14/2012 2232   KETONESUR 1+ 07/14/2012 2232   PROTEINUR Negative 02/10/2019 0847   PROTEINUR Negative 07/14/2012 2232   UROBILINOGEN 0.2 02/10/2019 0847   NITRITE neg 02/10/2019 0847   NITRITE Negative 07/14/2012 2232   LEUKOCYTESUR Negative 02/10/2019 0847   LEUKOCYTESUR Negative 07/14/2012 2232    Radiological Exams on Admission: Ct Head Wo Contrast  Result Date: 09/10/2019 CLINICAL DATA:  Mechanical fall, no reported head strike EXAM: CT HEAD WITHOUT CONTRAST CT CERVICAL SPINE WITHOUT CONTRAST TECHNIQUE: Multidetector CT imaging of the head and cervical spine was performed following the standard protocol without intravenous contrast. Multiplanar CT image reconstructions of the cervical spine were also generated. COMPARISON:  None. FINDINGS: CT HEAD FINDINGS Brain: Regions of gliosis in the bilateral basal ganglia and left cerebellar hemisphere likely reflects sequela of prior infarct. No evidence of acute infarction, hemorrhage, hydrocephalus, extra-axial collection or mass lesion/mass effect. Symmetric prominence of the ventricles, cisterns and sulci compatible with parenchymal volume loss. Patchy areas of white matter hypoattenuation are most compatible with chronic microvascular angiopathy. Vascular: Atherosclerotic calcification of the carotid siphons and intradural vertebral arteries. No hyperdense vessel. Skull: No calvarial fracture or suspicious osseous lesion. No scalp swelling or hematoma. Sinuses/Orbits: Paranasal sinuses and mastoid air cells are predominantly clear. Included orbital structures are unremarkable. Other: None CT CERVICAL SPINE FINDINGS Alignment: Preservation of the normal cervical lordosis without traumatic listhesis. No abnormal facet widening. Normal alignment of the craniocervical and atlantoaxial articulations. Degenerative 2 mm of anterolisthesis C5 on C6. No traumatic listhesis. Posterior elements are  normally aligned. Craniocervical and atlantoaxial alignment is maintained accounting for slight rotation of the patient's head. Skull base and vertebrae: The osseous structures appear diffusely demineralized which may limit detection of small or nondisplaced fractures. No acute fracture. No primary bone lesion or focal pathologic process. Soft tissues and spinal canal: No pre or paravertebral fluid or swelling. No visible canal hematoma. Disc levels: Multilevel intervertebral disc height loss with spondylitic endplate changes. Multilevel disc osteophyte complexes result in at most mild canal stenosis most pronounced at C3-4 secondary to a combination of disc osteophyte complex and posterior ligamentum flavum infolding. Diffuse uncinate spurring and facet hypertrophic changes result in mild-to-moderate multilevel neural foraminal narrowing. No severe neural foraminal stenosis is evident. Upper chest: Some abundant  subpleural fat noted in the apices with mild apical pleuroparenchymal scarring and features of emphysema. Other: Normal thyroid. Cervical carotid atherosclerosis and calcification of the proximal right subclavian artery. IMPRESSION: 1. No acute intracranial abnormality. No scalp swelling or calvarial fracture. 2. Old infarcts in the bilateral basal ganglia and left cerebellar hemisphere. 3. Moderate parenchymal volume loss and chronic microvascular angiopathy. 4. No acute cervical spine fracture. Diffuse osseous demineralization may limit detection of small or nondisplaced fractures. 5. Cervical spondylitic and facet degenerative changes, as above. Electronically Signed   By: Lovena Le M.D.   On: 09/10/2019 16:49   Ct Cervical Spine Wo Contrast  Result Date: 09/10/2019 CLINICAL DATA:  Mechanical fall, no reported head strike EXAM: CT HEAD WITHOUT CONTRAST CT CERVICAL SPINE WITHOUT CONTRAST TECHNIQUE: Multidetector CT imaging of the head and cervical spine was performed following the standard  protocol without intravenous contrast. Multiplanar CT image reconstructions of the cervical spine were also generated. COMPARISON:  None. FINDINGS: CT HEAD FINDINGS Brain: Regions of gliosis in the bilateral basal ganglia and left cerebellar hemisphere likely reflects sequela of prior infarct. No evidence of acute infarction, hemorrhage, hydrocephalus, extra-axial collection or mass lesion/mass effect. Symmetric prominence of the ventricles, cisterns and sulci compatible with parenchymal volume loss. Patchy areas of white matter hypoattenuation are most compatible with chronic microvascular angiopathy. Vascular: Atherosclerotic calcification of the carotid siphons and intradural vertebral arteries. No hyperdense vessel. Skull: No calvarial fracture or suspicious osseous lesion. No scalp swelling or hematoma. Sinuses/Orbits: Paranasal sinuses and mastoid air cells are predominantly clear. Included orbital structures are unremarkable. Other: None CT CERVICAL SPINE FINDINGS Alignment: Preservation of the normal cervical lordosis without traumatic listhesis. No abnormal facet widening. Normal alignment of the craniocervical and atlantoaxial articulations. Degenerative 2 mm of anterolisthesis C5 on C6. No traumatic listhesis. Posterior elements are normally aligned. Craniocervical and atlantoaxial alignment is maintained accounting for slight rotation of the patient's head. Skull base and vertebrae: The osseous structures appear diffusely demineralized which may limit detection of small or nondisplaced fractures. No acute fracture. No primary bone lesion or focal pathologic process. Soft tissues and spinal canal: No pre or paravertebral fluid or swelling. No visible canal hematoma. Disc levels: Multilevel intervertebral disc height loss with spondylitic endplate changes. Multilevel disc osteophyte complexes result in at most mild canal stenosis most pronounced at C3-4 secondary to a combination of disc osteophyte complex  and posterior ligamentum flavum infolding. Diffuse uncinate spurring and facet hypertrophic changes result in mild-to-moderate multilevel neural foraminal narrowing. No severe neural foraminal stenosis is evident. Upper chest: Some abundant subpleural fat noted in the apices with mild apical pleuroparenchymal scarring and features of emphysema. Other: Normal thyroid. Cervical carotid atherosclerosis and calcification of the proximal right subclavian artery. IMPRESSION: 1. No acute intracranial abnormality. No scalp swelling or calvarial fracture. 2. Old infarcts in the bilateral basal ganglia and left cerebellar hemisphere. 3. Moderate parenchymal volume loss and chronic microvascular angiopathy. 4. No acute cervical spine fracture. Diffuse osseous demineralization may limit detection of small or nondisplaced fractures. 5. Cervical spondylitic and facet degenerative changes, as above. Electronically Signed   By: Lovena Le M.D.   On: 09/10/2019 16:49   Ct Hip Right Wo Contrast  Result Date: 09/10/2019 CLINICAL DATA:  Right hip pain secondary to a fall at home. EXAM: CT OF THE RIGHT HIP WITHOUT CONTRAST TECHNIQUE: Multidetector CT imaging of the right hip was performed according to the standard protocol. Multiplanar CT image reconstructions were also generated. COMPARISON:  CT scan of the  abdomen and pelvis dated 07/15/2012 FINDINGS: Bones/Joint/Cartilage There is an impacted fracture of the proximal right femoral neck. No dislocation. The visualized pelvic bones are intact. No significant arthritic changes of right hip joint. Muscles and Tendons Normal. Soft tissues No acute abnormality. Fem-fem bypass graft. IMPRESSION: Impacted fracture of the proximal right femoral neck. Electronically Signed   By: Lorriane Shire M.D.   On: 09/10/2019 16:47   Dg Chest Portable 1 View  Result Date: 09/10/2019 CLINICAL DATA:  Fall with right hip pain EXAM: PORTABLE CHEST 1 VIEW COMPARISON:  07/14/2012 FINDINGS: Mild  reticular opacity at the periphery and lung bases, suspicious for chronic interstitial lung disease. No consolidation or effusion. Normal heart size. Aortic atherosclerosis. No pneumothorax. IMPRESSION: Mild peripheral and basilar reticular opacities, suspicious for chronic interstitial lung disease. No acute airspace disease is seen. Electronically Signed   By: Donavan Foil M.D.   On: 09/10/2019 16:49   Dg Hip Unilat With Pelvis 2-3 Views Right  Result Date: 09/10/2019 CLINICAL DATA:  Right hip pain after a fall EXAM: DG HIP (WITH OR WITHOUT PELVIS) 2-3V RIGHT COMPARISON:  CT of the right hip from the same day. FINDINGS: There is an impacted subcapital right femoral neck fracture which is not significantly changed since prior CT. There is no dislocation of either hip. Degenerative changes are seen in the lumbar spine. A vascular stent overlies the left iliac artery. IMPRESSION: 1. Impacted subcapital right femoral neck fracture. No dislocation. 2. Electronically Signed   By: Zerita Boers M.D.   On: 09/10/2019 19:27      Assessment/Plan  Right femoral neck fracture in the setting of mechanical fall -Patient has been evaluated by orthopedics and has planned right hip hemiarthroplasty tomorrow -N.p.o. after midnight - PRN Hydrocodone q6 hr for moderate pain, Morphine 0.5mg  q2hr for severe pain -PT when able following surgery  Erythrocytosis -Elevated hemoglobin of 20.6 and hematocrit of 59.1%.  This appears to been ongoing for several years -Concerning for polycythemia-check EPO, JAK2  History of CVA with residual left-sided weakness - Hold aspirin in anticipation for surgery tomorrow  Peripheral vascular disease s/p left foot toe amputation -stable. No change  Hypertension -Continue amlodipine, clonidine, metoprolol, quinapril  Hyperlipidemia -Continue atorvastatin  Insomnia -Continue trazodone  GERD -Continue omeprazole  DVT prophylaxis:SCD Code Status:DNR Family  Communication: Plan discussed with patient at bedside  disposition Plan: Home with at least 2 midnight stays  Consults called: Orthopedic Admission status: inpatient  Edlyn Rosenburg T Brystol Wasilewski DO [facility]   If 7PM-7AM, please contact night-coverage [email or web address] Password [email or web address]  09/10/2019, 8:12 PM

## 2019-09-10 NOTE — ED Notes (Signed)
Pt given urinal.

## 2019-09-10 NOTE — ED Provider Notes (Signed)
[facility] Emergency Department Provider Note  ____________________________________________   First MD Initiated Contact with Patient 09/10/19 1549     (approximate)  I have reviewed the triage vital signs and the nursing notes.   HISTORY  Chief Complaint Hip Pain    HPI Alexander Alvarado is a 78 y.o. male with history of hypertension, hyperlipidemia, stroke, PVD, here with fall.  The patient states he was in his usual state of health today.  He lives alone.  He is using a walker.  He states he bent down to pick up something off the floor of his house, when he fell forward, striking his right hip.  He does not believe he hit his head.  Reports immediate onset of severe aching, throbbing, right hip pain.  This pain is worse with any kind of movement or palpation.  There is no alleviating factors.  He denies any associated headache.  No neck pain.  No right upper extremity pain.  Denies history of previous hip fractures.  Denies any numbness or weakness.  He does report some spasms of the right thigh over the last 20 minutes or so.  He has not received anything for pain.        Past Medical History:  Diagnosis Date   Depression    GERD (gastroesophageal reflux disease)    High cholesterol    Hypertension    PVD (peripheral vascular disease) ([place])    Stroke ([place])    left side weakness    Patient Active Problem List   Diagnosis Date Noted   Femur fracture, right ([place]) 09/10/2019   Amputation of toe of left foot ([place]) 02/10/2019   Pre-diabetes 01/07/2017   B12 nutritional deficiency [id number]   Folic acid deficiency [id number]   Cerebrovascular accident (CVA) due to occlusion of cerebral artery ([place]) 12/30/2015   Senile ecchymosis 12/30/2015   Essential (primary) hypertension 03/12/2015   Gastro-esophageal reflux disease without esophagitis 03/12/2015   Hyperlipidemia, mixed 03/12/2015   Insomnia, persistent 03/12/2015   Neoplasm  of uncertain behavior of skin of hand 03/12/2015   Arthritis, degenerative 03/12/2015   Peripheral vascular disease ([place]) 03/12/2015   Compulsive tobacco user syndrome 03/12/2015   H/O alcohol abuse 10/29/2012    Past Surgical History:  Procedure Laterality Date   AMPUTATION Left 08/31/2015   Procedure: AMPUTATION DIGIT left 2nd toe ;  Surgeon: Samara Deist, DPM;  Location: [employer] [facility];  Service: Podiatry;  Laterality: Left;   AMPUTATION TOE Left 08/2015   second toe   CHOLECYSTECTOMY     LUMBAR DISC SURGERY     VASCULAR SURGERY Left 2011   x 5 to left leg    Prior to Admission medications   Medication Sig Start Date End Date Taking? Authorizing Provider  amLODipine (NORVASC) 10 MG tablet Take 1 tablet (10 mg total) by mouth daily. 02/10/19  Yes Glean Hess, MD  aspirin 81 MG tablet Take 81 mg by mouth daily.    Yes [provider]  atorvastatin (LIPITOR) 20 MG tablet Take 1 tablet (20 mg total) by mouth at bedtime. 02/10/19  Yes Glean Hess, MD  cloNIDine (CATAPRES) 0.1 MG tablet Take 1 tablet (0.1 mg total) by mouth 2 (two) times daily. 02/10/19  Yes Glean Hess, MD  cyanocobalamin 1000 MCG tablet Take 1,000 mcg by mouth daily.    Yes [provider]  folic acid (FOLVITE) 1 MG tablet Take 1 mg by mouth daily.    Yes [provider]  meloxicam (MOBIC) 7.5 MG tablet TAKE 2 TABLETS BY MOUTH AT BEDTIME Patient taking differently: Take 15 mg by mouth at bedtime.  08/24/19  Yes Glean Hess, MD  metoprolol tartrate (LOPRESSOR) 25 MG tablet Take 1 tablet (25 mg total) by mouth 2 (two) times daily. 02/10/19  Yes Glean Hess, MD  omeprazole (PRILOSEC) 40 MG capsule Take 1 capsule (40 mg total) by mouth daily. 02/10/19  Yes Glean Hess, MD  quinapril (ACCUPRIL) 40 MG tablet Take 1 tablet (40 mg total) by mouth daily. 02/10/19  Yes Glean Hess, MD  traZODone (DESYREL) 50 MG tablet Take 1-2 tablets (50-100 mg total)  by mouth at bedtime. 02/10/19  Yes Glean Hess, MD    Allergies Patient has no known allergies.  Family History  Problem Relation Age of Onset   CAD Mother     Social History Social History   Tobacco Use   Smoking status: Current Every Day Smoker    Packs/day: 0.25    Types: Cigarettes   Smokeless tobacco: Never Used   Tobacco comment: smokes 8 cigerettes a day ( cutting back )  Substance Use Topics   Alcohol use: No    Alcohol/week: 0.0 standard drinks   Drug use: No    Review of Systems  Review of Systems  Constitutional: Positive for fatigue. Negative for chills and fever.  HENT: Negative for sore throat.   Respiratory: Negative for shortness of breath.   Cardiovascular: Negative for chest pain.  Gastrointestinal: Negative for abdominal pain.  Genitourinary: Negative for flank pain.  Musculoskeletal: Positive for arthralgias and gait problem. Negative for neck pain.  Skin: Negative for rash and wound.  Allergic/Immunologic: Negative for immunocompromised state.  Neurological: Negative for weakness and numbness.  Hematological: Does not bruise/bleed easily.  All other systems reviewed and are negative.    ____________________________________________  PHYSICAL EXAM:      VITAL SIGNS: ED Triage Vitals  Enc Vitals Group     BP 09/10/19 1532 (!) 166/93     Pulse Rate 09/10/19 1532 86     Resp 09/10/19 1532 16     Temp --      Temp src --      SpO2 09/10/19 1532 92 %     Weight 09/10/19 1517 170 lb (77.1 kg)     Height 09/10/19 1517 5\' 11"  (1.803 m)     Head Circumference --      Peak Flow --      Pain Score 09/10/19 1516 6     Pain Loc --      Pain Edu? --      Excl. in [place]? --      Physical Exam Vitals signs and nursing note reviewed.  Constitutional:      General: He is not in acute distress.    Appearance: He is well-developed.  HENT:     Head: Normocephalic and atraumatic.  Eyes:     Conjunctiva/sclera: Conjunctivae normal.  Neck:      Musculoskeletal: Neck supple.  Cardiovascular:     Rate and Rhythm: Normal rate and regular rhythm.     Heart sounds: Normal heart sounds. No murmur. No friction rub.  Pulmonary:     Effort: Pulmonary effort is normal. No respiratory distress.     Breath sounds: Normal breath sounds. No wheezing or rales.  Abdominal:     General: There is no distension.     Palpations: Abdomen is soft.  Tenderness: There is no abdominal tenderness.  Musculoskeletal:     Comments: Right lower extremity shortened and externally rotated.  There is marked tenderness over the right greater trochanter with exquisite pain with any kind of logroll.  Palpable intermittent spasms of the right anterior quads  Skin:    General: Skin is warm.     Capillary Refill: Capillary refill takes less than 2 seconds.     Findings: No rash.     Comments: Superficial skin tears noted to bilateral anterior knees and bilateral elbows, no deep lacerations, no bony deformity or tenderness  Neurological:     Mental Status: He is alert and oriented to person, place, and time.     Motor: No abnormal muscle tone.       ____________________________________________   LABS (all labs ordered are listed, but only abnormal results are displayed)  Labs Reviewed  CBC WITH DIFFERENTIAL/PLATELET - Abnormal; Notable for the following components:      Result Value   WBC 21.9 (*)    RBC 6.48 (*)    Hemoglobin 20.6 (*)    HCT 59.1 (*)    Neutro Abs 18.2 (*)    Monocytes Absolute 1.4 (*)    Abs Immature Granulocytes 0.13 (*)    All other components within normal limits  COMPREHENSIVE METABOLIC PANEL - Abnormal; Notable for the following components:   Glucose, Bld 118 (*)    Alkaline Phosphatase 138 (*)    Total Bilirubin 1.5 (*)    All other components within normal limits  SARS CORONAVIRUS 2 (TAT 6-24 HRS)  PROTIME-INR  CBC WITH DIFFERENTIAL/PLATELET  BASIC METABOLIC PANEL  CBC  TYPE AND SCREEN  TYPE AND SCREEN  TYPE  AND SCREEN    ____________________________________________  ________________________________________  RADIOLOGY All imaging, including plain films, CT scans, and ultrasounds, independently reviewed by me, and interpretations confirmed via formal radiology reads.  ED MD interpretation:   CT Head: NAICA, old infarcts noted CT C-Spine: Neg CT Hip: R femoral neck fx CXR: Clear  Official radiology report(s): Ct Head Wo Contrast  Result Date: 09/10/2019 CLINICAL DATA:  Mechanical fall, no reported head strike EXAM: CT HEAD WITHOUT CONTRAST CT CERVICAL SPINE WITHOUT CONTRAST TECHNIQUE: Multidetector CT imaging of the head and cervical spine was performed following the standard protocol without intravenous contrast. Multiplanar CT image reconstructions of the cervical spine were also generated. COMPARISON:  None. FINDINGS: CT HEAD FINDINGS Brain: Regions of gliosis in the bilateral basal ganglia and left cerebellar hemisphere likely reflects sequela of prior infarct. No evidence of acute infarction, hemorrhage, hydrocephalus, extra-axial collection or mass lesion/mass effect. Symmetric prominence of the ventricles, cisterns and sulci compatible with parenchymal volume loss. Patchy areas of white matter hypoattenuation are most compatible with chronic microvascular angiopathy. Vascular: Atherosclerotic calcification of the carotid siphons and intradural vertebral arteries. No hyperdense vessel. Skull: No calvarial fracture or suspicious osseous lesion. No scalp swelling or hematoma. Sinuses/Orbits: Paranasal sinuses and mastoid air cells are predominantly clear. Included orbital structures are unremarkable. Other: None CT CERVICAL SPINE FINDINGS Alignment: Preservation of the normal cervical lordosis without traumatic listhesis. No abnormal facet widening. Normal alignment of the craniocervical and atlantoaxial articulations. Degenerative 2 mm of anterolisthesis C5 on C6. No traumatic listhesis. Posterior  elements are normally aligned. Craniocervical and atlantoaxial alignment is maintained accounting for slight rotation of the patient's head. Skull base and vertebrae: The osseous structures appear diffusely demineralized which may limit detection of small or nondisplaced fractures. No acute fracture. No primary bone lesion or  focal pathologic process. Soft tissues and spinal canal: No pre or paravertebral fluid or swelling. No visible canal hematoma. Disc levels: Multilevel intervertebral disc height loss with spondylitic endplate changes. Multilevel disc osteophyte complexes result in at most mild canal stenosis most pronounced at C3-4 secondary to a combination of disc osteophyte complex and posterior ligamentum flavum infolding. Diffuse uncinate spurring and facet hypertrophic changes result in mild-to-moderate multilevel neural foraminal narrowing. No severe neural foraminal stenosis is evident. Upper chest: Some abundant subpleural fat noted in the apices with mild apical pleuroparenchymal scarring and features of emphysema. Other: Normal thyroid. Cervical carotid atherosclerosis and calcification of the proximal right subclavian artery. IMPRESSION: 1. No acute intracranial abnormality. No scalp swelling or calvarial fracture. 2. Old infarcts in the bilateral basal ganglia and left cerebellar hemisphere. 3. Moderate parenchymal volume loss and chronic microvascular angiopathy. 4. No acute cervical spine fracture. Diffuse osseous demineralization may limit detection of small or nondisplaced fractures. 5. Cervical spondylitic and facet degenerative changes, as above. Electronically Signed   By: Lovena Le M.D.   On: 09/10/2019 16:49   Ct Cervical Spine Wo Contrast  Result Date: 09/10/2019 CLINICAL DATA:  Mechanical fall, no reported head strike EXAM: CT HEAD WITHOUT CONTRAST CT CERVICAL SPINE WITHOUT CONTRAST TECHNIQUE: Multidetector CT imaging of the head and cervical spine was performed following the  standard protocol without intravenous contrast. Multiplanar CT image reconstructions of the cervical spine were also generated. COMPARISON:  None. FINDINGS: CT HEAD FINDINGS Brain: Regions of gliosis in the bilateral basal ganglia and left cerebellar hemisphere likely reflects sequela of prior infarct. No evidence of acute infarction, hemorrhage, hydrocephalus, extra-axial collection or mass lesion/mass effect. Symmetric prominence of the ventricles, cisterns and sulci compatible with parenchymal volume loss. Patchy areas of white matter hypoattenuation are most compatible with chronic microvascular angiopathy. Vascular: Atherosclerotic calcification of the carotid siphons and intradural vertebral arteries. No hyperdense vessel. Skull: No calvarial fracture or suspicious osseous lesion. No scalp swelling or hematoma. Sinuses/Orbits: Paranasal sinuses and mastoid air cells are predominantly clear. Included orbital structures are unremarkable. Other: None CT CERVICAL SPINE FINDINGS Alignment: Preservation of the normal cervical lordosis without traumatic listhesis. No abnormal facet widening. Normal alignment of the craniocervical and atlantoaxial articulations. Degenerative 2 mm of anterolisthesis C5 on C6. No traumatic listhesis. Posterior elements are normally aligned. Craniocervical and atlantoaxial alignment is maintained accounting for slight rotation of the patient's head. Skull base and vertebrae: The osseous structures appear diffusely demineralized which may limit detection of small or nondisplaced fractures. No acute fracture. No primary bone lesion or focal pathologic process. Soft tissues and spinal canal: No pre or paravertebral fluid or swelling. No visible canal hematoma. Disc levels: Multilevel intervertebral disc height loss with spondylitic endplate changes. Multilevel disc osteophyte complexes result in at most mild canal stenosis most pronounced at C3-4 secondary to a combination of disc  osteophyte complex and posterior ligamentum flavum infolding. Diffuse uncinate spurring and facet hypertrophic changes result in mild-to-moderate multilevel neural foraminal narrowing. No severe neural foraminal stenosis is evident. Upper chest: Some abundant subpleural fat noted in the apices with mild apical pleuroparenchymal scarring and features of emphysema. Other: Normal thyroid. Cervical carotid atherosclerosis and calcification of the proximal right subclavian artery. IMPRESSION: 1. No acute intracranial abnormality. No scalp swelling or calvarial fracture. 2. Old infarcts in the bilateral basal ganglia and left cerebellar hemisphere. 3. Moderate parenchymal volume loss and chronic microvascular angiopathy. 4. No acute cervical spine fracture. Diffuse osseous demineralization may limit detection of small or nondisplaced  fractures. 5. Cervical spondylitic and facet degenerative changes, as above. Electronically Signed   By: Lovena Le M.D.   On: 09/10/2019 16:49   Ct Hip Right Wo Contrast  Result Date: 09/10/2019 CLINICAL DATA:  Right hip pain secondary to a fall at home. EXAM: CT OF THE RIGHT HIP WITHOUT CONTRAST TECHNIQUE: Multidetector CT imaging of the right hip was performed according to the standard protocol. Multiplanar CT image reconstructions were also generated. COMPARISON:  CT scan of the abdomen and pelvis dated 07/15/2012 FINDINGS: Bones/Joint/Cartilage There is an impacted fracture of the proximal right femoral neck. No dislocation. The visualized pelvic bones are intact. No significant arthritic changes of right hip joint. Muscles and Tendons Normal. Soft tissues No acute abnormality. Fem-fem bypass graft. IMPRESSION: Impacted fracture of the proximal right femoral neck. Electronically Signed   By: Lorriane Shire M.D.   On: 09/10/2019 16:47   Dg Chest Portable 1 View  Result Date: 09/10/2019 CLINICAL DATA:  Fall with right hip pain EXAM: PORTABLE CHEST 1 VIEW COMPARISON:  07/14/2012  FINDINGS: Mild reticular opacity at the periphery and lung bases, suspicious for chronic interstitial lung disease. No consolidation or effusion. Normal heart size. Aortic atherosclerosis. No pneumothorax. IMPRESSION: Mild peripheral and basilar reticular opacities, suspicious for chronic interstitial lung disease. No acute airspace disease is seen. Electronically Signed   By: Donavan Foil M.D.   On: 09/10/2019 16:49   Dg Hip Unilat With Pelvis 2-3 Views Right  Result Date: 09/10/2019 CLINICAL DATA:  Right hip pain after a fall EXAM: DG HIP (WITH OR WITHOUT PELVIS) 2-3V RIGHT COMPARISON:  CT of the right hip from the same day. FINDINGS: There is an impacted subcapital right femoral neck fracture which is not significantly changed since prior CT. There is no dislocation of either hip. Degenerative changes are seen in the lumbar spine. A vascular stent overlies the left iliac artery. IMPRESSION: 1. Impacted subcapital right femoral neck fracture. No dislocation. 2. Electronically Signed   By: Zerita Boers M.D.   On: 09/10/2019 19:27    ____________________________________________  PROCEDURES   Procedure(s) performed (including Critical Care):  Procedures  ____________________________________________  INITIAL IMPRESSION / MDM / [place] / ED COURSE  As part of my medical decision making, I reviewed the following data within the [place] notes reviewed and incorporated, Old chart reviewed, Notes from prior ED visits, and [place] Controlled Substance Database       *ANANIA BARBIE was evaluated in Emergency Department on 09/10/2019 for the symptoms described in the history of present illness. He was evaluated in the context of the global COVID-19 pandemic, which necessitated consideration that the patient might be at risk for infection with the SARS-CoV-2 virus that causes COVID-19. Institutional protocols and algorithms that pertain to the evaluation of  patients at risk for COVID-19 are in a state of rapid change based on information released by regulatory bodies including the CDC and federal and state organizations. These policies and algorithms were followed during the patient's care in the ED.  Some ED evaluations and interventions may be delayed as a result of limited staffing during the pandemic.*  Clinical Course as of Sep 09 2025  Thu Sep 09, 5097  1858 78 year old here with right hip pain after fall.  Imaging shows femoral neck fracture.  Lab work is overall reassuring.  Plan to admit to medicine.  Discussed with Dr. Posey Pronto, plan for likely OR as an add-on after cases tomorrow.  Covid sent.   [  CI]    Clinical Course User Index [CI] Duffy Bruce, MD    Medical Decision Making:  As above. Admit to medicine. Of note, labs show significant leukocytosis and elevated Hgb, which appear to be slightly though chronically elevated. W/u as inpt. No fever, adamant he felt fine prior to fall, no signs of infection clinically.  ____________________________________________  FINAL CLINICAL IMPRESSION(S) / ED DIAGNOSES  Final diagnoses:  Fracture of unspecified part of neck of right femur, initial encounter for closed fracture ([place])     MEDICATIONS GIVEN DURING THIS VISIT:  Medications  acetaminophen (TYLENOL) tablet 650 mg (has no administration in time range)  ceFAZolin (ANCEF) IVPB 2g/100 mL premix (has no administration in time range)  HYDROcodone-acetaminophen (NORCO/VICODIN) 5-325 MG per tablet 1-2 tablet (has no administration in time range)  morphine 2 MG/ML injection 0.5 mg (has no administration in time range)  senna (SENOKOT) tablet 8.6 mg (has no administration in time range)  morphine 4 MG/ML injection 4 mg (4 mg Intravenous Given 09/10/19 1617)  ondansetron (ZOFRAN) injection 4 mg (4 mg Intravenous Given 09/10/19 1616)  0.9 %  sodium chloride infusion ( Intravenous New Bag/Given 09/10/19 1811)     ED Discharge Orders     None       Note:  This document was prepared using Dragon voice recognition software and may include unintentional dictation errors.   Duffy Bruce, MD 09/10/19 2027

## 2019-09-10 NOTE — ED Notes (Signed)
Report given to Melissa, RN

## 2019-09-10 NOTE — Progress Notes (Signed)
Report called to receiving nurse Almyra Free, RN  for patient going to room 155.

## 2019-09-10 NOTE — Consult Note (Signed)
ORTHOPAEDIC CONSULTATION  REQUESTING PHYSICIAN: Duffy Bruce, MD  Chief Complaint:   R hip pain  History of Present Illness: Alexander Alvarado is a 78 y.o. male who had a fall at home earlier today.  The patient noted immediate hip pain and inability to ambulate.  The patient ambulates with a walker at baseline. He lives alone at home. Pain is described as sharp at its worst and a dull ache at its best.  Pain is rated a 10 out of 10 in severity.  Pain is improved with rest and immobilization.  Pain is worse with any sort of movement. Imaging in the emergency department shows a displaced femoral neck fracture.  His medical history is significant for a stroke that left him with some residual left-sided weakness. He is not on any blood thinners besides aspirin.   Past Medical History:  Diagnosis Date  . Depression   . GERD (gastroesophageal reflux disease)   . High cholesterol   . Hypertension   . PVD (peripheral vascular disease) ([place])   . Stroke [facility])    left side weakness   Past Surgical History:  Procedure Laterality Date  . AMPUTATION Left 08/31/2015   Procedure: AMPUTATION DIGIT left 2nd toe ;  Surgeon: Samara Deist, DPM;  Location: [place];  Service: Podiatry;  Laterality: Left;  . AMPUTATION TOE Left 08/2015   second toe  . CHOLECYSTECTOMY    . LUMBAR DISC SURGERY    . VASCULAR SURGERY Left 2011   x 5 to left leg   Social History   Socioeconomic History  . Marital status: Single    Spouse name: Not on file  . Number of children: Not on file  . Years of education: Not on file  . Highest education level: Not on file  Occupational History  . Not on file  Social Needs  . Financial resource strain: Not on file  . Food insecurity    Worry: Not on file    Inability: Not on file  . Transportation needs    Medical: Not on file    Non-medical: Not on file  Tobacco Use  . Smoking status: Current  Every Day Smoker    Packs/day: 0.25    Types: Cigarettes  . Smokeless tobacco: Never Used  . Tobacco comment: smokes 8 cigerettes a day ( cutting back )  Substance and Sexual Activity  . Alcohol use: No    Alcohol/week: 0.0 standard drinks  . Drug use: No  . Sexual activity: Not Currently  Lifestyle  . Physical activity    Days per week: Not on file    Minutes per session: Not on file  . Stress: Not on file  Relationships  . Social Herbalist on phone: Not on file    Gets together: Not on file    Attends religious service: Not on file    Active member of club or organization: Not on file    Attends meetings of clubs or organizations: Not on file    Relationship status: Not on file  Other Topics Concern  . Not on file  Social History Narrative  . Not on file   Family History  Problem Relation Age of Onset  . CAD Mother    No Known Allergies Prior to Admission medications   Medication Sig Start Date End Date Taking? Authorizing Provider  amLODipine (NORVASC) 10 MG tablet Take 1 tablet (10 mg total) by mouth daily. 02/10/19   Glean Hess, MD  aspirin 81 MG tablet Take 1 tablet by mouth daily.     [provider]  atorvastatin (LIPITOR) 20 MG tablet Take 1 tablet (20 mg total) by mouth at bedtime. 02/10/19   Glean Hess, MD  cloNIDine (CATAPRES) 0.1 MG tablet Take 1 tablet (0.1 mg total) by mouth 2 (two) times daily. 02/10/19   Glean Hess, MD  Cyanocobalamin 1000 MCG CAPS Take 1 capsule by mouth daily.    [provider]  folic acid (FOLVITE) 1 MG tablet Take 1 tablet by mouth daily.    [provider]  meloxicam (MOBIC) 7.5 MG tablet TAKE 2 TABLETS BY MOUTH AT BEDTIME 08/24/19   Glean Hess, MD  metoprolol tartrate (LOPRESSOR) 25 MG tablet Take 1 tablet (25 mg total) by mouth 2 (two) times daily. 02/10/19   Glean Hess, MD  omeprazole (PRILOSEC) 40 MG capsule Take 1 capsule (40 mg total) by mouth daily. 02/10/19    Glean Hess, MD  quinapril (ACCUPRIL) 40 MG tablet Take 1 tablet (40 mg total) by mouth daily. 02/10/19   Glean Hess, MD  traZODone (DESYREL) 50 MG tablet Take 1-2 tablets (50-100 mg total) by mouth at bedtime. 02/10/19   Glean Hess, MD   Recent Labs    09/10/19 1703  WBC 21.9*  HGB 20.6*  HCT 59.1*  PLT 171  K 3.6  CL 99  CO2 25  BUN 12  CREATININE 0.81  GLUCOSE 118*  CALCIUM 9.3  INR 1.1   Ct Head Wo Contrast  Result Date: 09/10/2019 CLINICAL DATA:  Mechanical fall, no reported head strike EXAM: CT HEAD WITHOUT CONTRAST CT CERVICAL SPINE WITHOUT CONTRAST TECHNIQUE: Multidetector CT imaging of the head and cervical spine was performed following the standard protocol without intravenous contrast. Multiplanar CT image reconstructions of the cervical spine were also generated. COMPARISON:  None. FINDINGS: CT HEAD FINDINGS Brain: Regions of gliosis in the bilateral basal ganglia and left cerebellar hemisphere likely reflects sequela of prior infarct. No evidence of acute infarction, hemorrhage, hydrocephalus, extra-axial collection or mass lesion/mass effect. Symmetric prominence of the ventricles, cisterns and sulci compatible with parenchymal volume loss. Patchy areas of white matter hypoattenuation are most compatible with chronic microvascular angiopathy. Vascular: Atherosclerotic calcification of the carotid siphons and intradural vertebral arteries. No hyperdense vessel. Skull: No calvarial fracture or suspicious osseous lesion. No scalp swelling or hematoma. Sinuses/Orbits: Paranasal sinuses and mastoid air cells are predominantly clear. Included orbital structures are unremarkable. Other: None CT CERVICAL SPINE FINDINGS Alignment: Preservation of the normal cervical lordosis without traumatic listhesis. No abnormal facet widening. Normal alignment of the craniocervical and atlantoaxial articulations. Degenerative 2 mm of anterolisthesis C5 on C6. No traumatic listhesis.  Posterior elements are normally aligned. Craniocervical and atlantoaxial alignment is maintained accounting for slight rotation of the patient's head. Skull base and vertebrae: The osseous structures appear diffusely demineralized which may limit detection of small or nondisplaced fractures. No acute fracture. No primary bone lesion or focal pathologic process. Soft tissues and spinal canal: No pre or paravertebral fluid or swelling. No visible canal hematoma. Disc levels: Multilevel intervertebral disc height loss with spondylitic endplate changes. Multilevel disc osteophyte complexes result in at most mild canal stenosis most pronounced at C3-4 secondary to a combination of disc osteophyte complex and posterior ligamentum flavum infolding. Diffuse uncinate spurring and facet hypertrophic changes result in mild-to-moderate multilevel neural foraminal narrowing. No severe neural foraminal stenosis is evident. Upper chest: Some abundant subpleural fat noted in the  apices with mild apical pleuroparenchymal scarring and features of emphysema. Other: Normal thyroid. Cervical carotid atherosclerosis and calcification of the proximal right subclavian artery. IMPRESSION: 1. No acute intracranial abnormality. No scalp swelling or calvarial fracture. 2. Old infarcts in the bilateral basal ganglia and left cerebellar hemisphere. 3. Moderate parenchymal volume loss and chronic microvascular angiopathy. 4. No acute cervical spine fracture. Diffuse osseous demineralization may limit detection of small or nondisplaced fractures. 5. Cervical spondylitic and facet degenerative changes, as above. Electronically Signed   By: Lovena Le M.D.   On: 09/10/2019 16:49   Ct Cervical Spine Wo Contrast  Result Date: 09/10/2019 CLINICAL DATA:  Mechanical fall, no reported head strike EXAM: CT HEAD WITHOUT CONTRAST CT CERVICAL SPINE WITHOUT CONTRAST TECHNIQUE: Multidetector CT imaging of the head and cervical spine was performed  following the standard protocol without intravenous contrast. Multiplanar CT image reconstructions of the cervical spine were also generated. COMPARISON:  None. FINDINGS: CT HEAD FINDINGS Brain: Regions of gliosis in the bilateral basal ganglia and left cerebellar hemisphere likely reflects sequela of prior infarct. No evidence of acute infarction, hemorrhage, hydrocephalus, extra-axial collection or mass lesion/mass effect. Symmetric prominence of the ventricles, cisterns and sulci compatible with parenchymal volume loss. Patchy areas of white matter hypoattenuation are most compatible with chronic microvascular angiopathy. Vascular: Atherosclerotic calcification of the carotid siphons and intradural vertebral arteries. No hyperdense vessel. Skull: No calvarial fracture or suspicious osseous lesion. No scalp swelling or hematoma. Sinuses/Orbits: Paranasal sinuses and mastoid air cells are predominantly clear. Included orbital structures are unremarkable. Other: None CT CERVICAL SPINE FINDINGS Alignment: Preservation of the normal cervical lordosis without traumatic listhesis. No abnormal facet widening. Normal alignment of the craniocervical and atlantoaxial articulations. Degenerative 2 mm of anterolisthesis C5 on C6. No traumatic listhesis. Posterior elements are normally aligned. Craniocervical and atlantoaxial alignment is maintained accounting for slight rotation of the patient's head. Skull base and vertebrae: The osseous structures appear diffusely demineralized which may limit detection of small or nondisplaced fractures. No acute fracture. No primary bone lesion or focal pathologic process. Soft tissues and spinal canal: No pre or paravertebral fluid or swelling. No visible canal hematoma. Disc levels: Multilevel intervertebral disc height loss with spondylitic endplate changes. Multilevel disc osteophyte complexes result in at most mild canal stenosis most pronounced at C3-4 secondary to a combination of  disc osteophyte complex and posterior ligamentum flavum infolding. Diffuse uncinate spurring and facet hypertrophic changes result in mild-to-moderate multilevel neural foraminal narrowing. No severe neural foraminal stenosis is evident. Upper chest: Some abundant subpleural fat noted in the apices with mild apical pleuroparenchymal scarring and features of emphysema. Other: Normal thyroid. Cervical carotid atherosclerosis and calcification of the proximal right subclavian artery. IMPRESSION: 1. No acute intracranial abnormality. No scalp swelling or calvarial fracture. 2. Old infarcts in the bilateral basal ganglia and left cerebellar hemisphere. 3. Moderate parenchymal volume loss and chronic microvascular angiopathy. 4. No acute cervical spine fracture. Diffuse osseous demineralization may limit detection of small or nondisplaced fractures. 5. Cervical spondylitic and facet degenerative changes, as above. Electronically Signed   By: Lovena Le M.D.   On: 09/10/2019 16:49   Ct Hip Right Wo Contrast  Result Date: 09/10/2019 CLINICAL DATA:  Right hip pain secondary to a fall at home. EXAM: CT OF THE RIGHT HIP WITHOUT CONTRAST TECHNIQUE: Multidetector CT imaging of the right hip was performed according to the standard protocol. Multiplanar CT image reconstructions were also generated. COMPARISON:  CT scan of the abdomen and pelvis dated 07/15/2012  FINDINGS: Bones/Joint/Cartilage There is an impacted fracture of the proximal right femoral neck. No dislocation. The visualized pelvic bones are intact. No significant arthritic changes of right hip joint. Muscles and Tendons Normal. Soft tissues No acute abnormality. Fem-fem bypass graft. IMPRESSION: Impacted fracture of the proximal right femoral neck. Electronically Signed   By: Lorriane Shire M.D.   On: 09/10/2019 16:47   Dg Chest Portable 1 View  Result Date: 09/10/2019 CLINICAL DATA:  Fall with right hip pain EXAM: PORTABLE CHEST 1 VIEW COMPARISON:   07/14/2012 FINDINGS: Mild reticular opacity at the periphery and lung bases, suspicious for chronic interstitial lung disease. No consolidation or effusion. Normal heart size. Aortic atherosclerosis. No pneumothorax. IMPRESSION: Mild peripheral and basilar reticular opacities, suspicious for chronic interstitial lung disease. No acute airspace disease is seen. Electronically Signed   By: Donavan Foil M.D.   On: 09/10/2019 16:49     Positive ROS: All other systems have been reviewed and were otherwise negative with the exception of those mentioned in the HPI and as above.  Physical Exam: BP (!) 166/93 (BP Location: Right Arm)   Pulse 86   Resp 16   Ht 5\' 11"  (1.803 m)   Wt 77.1 kg   SpO2 92%   BMI 23.71 kg/m  General:  Alert, no acute distress Psychiatric:  Patient is competent for consent with normal mood and affect   Cardiovascular:  No pedal edema, regular rate and rhythm Respiratory:  No wheezing, non-labored breathing GI:  Abdomen is soft and non-tender Skin:  No lesions in the area of chief complaint, no erythema Neurologic:  Sensation intact distally, CN grossly intact Lymphatic:  No axillary or cervical lymphadenopathy  Orthopedic Exam:  RLE: 5/5 DF/PF/EHL SILT s/s/t/sp/dp distr Foot wwp +Log roll/axial load   Imaging:  As above: R displaced femoral neck fracture  Assessment/Plan: Alexander Alvarado is a 78 y.o. male with a R displaced femoral neck fracture   1. I discussed the various treatment options including both surgical and non-surgical management of her fracture with the patient. We discussed the high risk of perioperative complications due to patient's age and other co-morbidities. After discussion of risks, benefits, and alternatives to surgery, the patient was in agreement to proceed with surgery. The goals of surgery would be to provide adequate pain relief and allow for mobilization. Plan for surgery is R hip hemiarthroplasty tomorrow, 09/11/19. Consent  obtained and in chart.  2. NPO after midnight 3. Hold anticoagulation in advance of OR 4. Admit to Hospitalist service. Please medically optimize as able.    Leim Fabry   09/10/2019 6:52 PM

## 2019-09-10 NOTE — ED Notes (Signed)
Dr Isaacs at bedside 

## 2019-09-10 NOTE — ED Triage Notes (Addendum)
Pt arrives via EMS from home after having a mechanical fall and hurting his R hip- pt denies hitting his head, no LOC, pt does take blood thinners- pt states he cannot bear weight on his R side and pt has L sided weakness from previous stroke- no deformity noted

## 2019-09-10 NOTE — ED Notes (Signed)
Pt transported to CT ?

## 2019-09-11 ENCOUNTER — Inpatient Hospital Stay: Payer: Medicare HMO

## 2019-09-11 ENCOUNTER — Encounter
Admission: EM | Disposition: A | Discharge status: Skilled Nursing Facility | Payer: Self-pay | Source: Home / Self Care | Attending: Family Medicine

## 2019-09-11 ENCOUNTER — Inpatient Hospital Stay: Payer: Medicare HMO | Admitting: Anesthesiology

## 2019-09-11 ENCOUNTER — Encounter: Payer: Self-pay | Admitting: *Deleted

## 2019-09-11 DIAGNOSIS — I6350 Cerebral infarction due to unspecified occlusion or stenosis of unspecified cerebral artery: Secondary | ICD-10-CM

## 2019-09-11 HISTORY — PX: HIP ARTHROPLASTY: SHX981

## 2019-09-11 LAB — BASIC METABOLIC PANEL
Anion gap: 12 (ref 5–15)
BUN: 16 mg/dL (ref 8–23)
CO2: 25 mmol/L (ref 22–32)
Calcium: 8.9 mg/dL (ref 8.9–10.3)
Chloride: 100 mmol/L (ref 98–111)
Creatinine, Ser: 0.76 mg/dL (ref 0.61–1.24)
GFR calc Af Amer: >60 mL/min (ref >60)
GFR calc non Af Amer: >60 mL/min (ref >60)
Glucose, Bld: 120 mg/dL — ABNORMAL HIGH (ref 70–99)
Potassium: 4 mmol/L (ref 3.5–5.1)
Sodium: 137 mmol/L (ref 135–145)

## 2019-09-11 LAB — CBC
HCT: 58.7 % — ABNORMAL HIGH (ref 39.0–52.0)
Hemoglobin: 20 g/dL — ABNORMAL HIGH (ref 13.0–17.0)
MCH: 31.6 pg (ref 26.0–34.0)
MCHC: 34.1 g/dL (ref 30.0–36.0)
MCV: 92.7 fL (ref 80.0–100.0)
Platelets: 162 10*3/uL (ref 150–400)
RBC: 6.33 MIL/uL — ABNORMAL HIGH (ref 4.22–5.81)
RDW: 12.8 % (ref 11.5–15.5)
WBC: 17.3 10*3/uL — ABNORMAL HIGH (ref 4.0–10.5)
nRBC: 0 % (ref 0.0–0.2)

## 2019-09-11 SURGERY — HEMIARTHROPLASTY, HIP, DIRECT ANTERIOR APPROACH, FOR FRACTURE
Anesthesia: Spinal | Site: Hip | Laterality: Right

## 2019-09-11 MED ORDER — HYDROMORPHONE HCL 1 MG/ML IJ SOLN: 0.2000 mg | INTRAMUSCULAR | Status: DC | PRN

## 2019-09-11 MED ORDER — ACETAMINOPHEN 10 MG/ML IV SOLN
INTRAVENOUS | Status: AC
  Filled 2019-09-11: qty 100

## 2019-09-11 MED ORDER — EPHEDRINE SULFATE 50 MG/ML IJ SOLN
INTRAMUSCULAR | Status: DC | PRN
  Administered 2019-09-11 (×2): 10 mg via INTRAVENOUS

## 2019-09-11 MED ORDER — ONDANSETRON HCL 4 MG/2ML IJ SOLN
4.0000 mg | Freq: Three times a day (TID) | INTRAMUSCULAR | Status: DC | PRN
  Administered 2019-09-11: 4 mg via INTRAVENOUS
  Filled 2019-09-11: qty 2

## 2019-09-11 MED ORDER — CEFAZOLIN SODIUM-DEXTROSE 2-4 GM/100ML-% IV SOLN
2.0000 g | Freq: Four times a day (QID) | INTRAVENOUS | Status: AC
  Administered 2019-09-11 – 2019-09-12 (×2): 2 g via INTRAVENOUS
  Filled 2019-09-11 (×2): qty 100

## 2019-09-11 MED ORDER — ENOXAPARIN SODIUM 40 MG/0.4ML ~~LOC~~ SOLN
40.0000 mg | SUBCUTANEOUS | Status: DC
  Administered 2019-09-12 – 2019-09-16 (×5): 40 mg via SUBCUTANEOUS
  Filled 2019-09-11 (×5): qty 0.4

## 2019-09-11 MED ORDER — EPHEDRINE SULFATE 50 MG/ML IJ SOLN
INTRAMUSCULAR | Status: AC
  Filled 2019-09-11: qty 1

## 2019-09-11 MED ORDER — PROPOFOL 500 MG/50ML IV EMUL
INTRAVENOUS | Status: DC | PRN
  Administered 2019-09-11: 50 ug/kg/min via INTRAVENOUS

## 2019-09-11 MED ORDER — BISACODYL 10 MG RE SUPP
10.0000 mg | Freq: Every day | RECTAL | Status: DC | PRN
  Administered 2019-09-13: 10 mg via RECTAL
  Filled 2019-09-11 (×2): qty 1

## 2019-09-11 MED ORDER — OXYCODONE HCL 5 MG PO TABS
10.0000 mg | ORAL_TABLET | ORAL | Status: DC | PRN
  Administered 2019-09-11: 10 mg via ORAL
  Filled 2019-09-11: qty 2

## 2019-09-11 MED ORDER — ONDANSETRON HCL 4 MG/2ML IJ SOLN
INTRAMUSCULAR | Status: DC | PRN
  Administered 2019-09-11: 4 mg via INTRAVENOUS

## 2019-09-11 MED ORDER — ONDANSETRON HCL 4 MG PO TABS
4.0000 mg | ORAL_TABLET | Freq: Four times a day (QID) | ORAL | Status: DC | PRN
  Administered 2019-09-11: 4 mg via ORAL
  Filled 2019-09-11: qty 1

## 2019-09-11 MED ORDER — ASPIRIN EC 81 MG PO TBEC
81.0000 mg | DELAYED_RELEASE_TABLET | Freq: Every day | ORAL | Status: DC
  Administered 2019-09-12 – 2019-09-16 (×5): 81 mg via ORAL
  Filled 2019-09-11 (×5): qty 1

## 2019-09-11 MED ORDER — ACETAMINOPHEN 500 MG PO TABS: 1000.0000 mg | ORAL_TABLET | Freq: Three times a day (TID) | ORAL | Status: DC

## 2019-09-11 MED ORDER — MENTHOL 3 MG MT LOZG
1.0000 | LOZENGE | OROMUCOSAL | Status: DC | PRN
  Filled 2019-09-11: qty 9

## 2019-09-11 MED ORDER — PHENOL 1.4 % MT LIQD
1.0000 | OROMUCOSAL | Status: DC | PRN
  Filled 2019-09-11: qty 177

## 2019-09-11 MED ORDER — ONDANSETRON HCL 4 MG/2ML IJ SOLN: 4.0000 mg | Freq: Four times a day (QID) | INTRAMUSCULAR | Status: DC | PRN

## 2019-09-11 MED ORDER — NEOMYCIN-POLYMYXIN B GU 40-200000 IR SOLN
Status: AC
  Filled 2019-09-11: qty 20

## 2019-09-11 MED ORDER — OXYCODONE HCL 5 MG PO TABS: 5.0000 mg | ORAL_TABLET | Freq: Four times a day (QID) | ORAL | Status: DC | PRN

## 2019-09-11 MED ORDER — ACETAMINOPHEN 500 MG PO TABS
1000.0000 mg | ORAL_TABLET | Freq: Three times a day (TID) | ORAL | Status: DC
  Administered 2019-09-12 (×2): 1000 mg via ORAL
  Filled 2019-09-11 (×2): qty 2

## 2019-09-11 MED ORDER — PROPOFOL 500 MG/50ML IV EMUL
INTRAVENOUS | Status: AC
  Filled 2019-09-11: qty 50

## 2019-09-11 MED ORDER — BUPIVACAINE HCL (PF) 0.5 % IJ SOLN
INTRAMUSCULAR | Status: AC
  Filled 2019-09-11: qty 30

## 2019-09-11 MED ORDER — SODIUM CHLORIDE 0.9 % IV SOLN
INTRAVENOUS | Status: DC | PRN
  Administered 2019-09-11: 50 ug/min via INTRAVENOUS

## 2019-09-11 MED ORDER — FENTANYL CITRATE (PF) 100 MCG/2ML IJ SOLN: 25.0000 ug | INTRAMUSCULAR | Status: DC | PRN

## 2019-09-11 MED ORDER — FENTANYL CITRATE (PF) 100 MCG/2ML IJ SOLN
INTRAMUSCULAR | Status: AC
  Filled 2019-09-11: qty 2

## 2019-09-11 MED ORDER — BUPIVACAINE HCL (PF) 0.5 % IJ SOLN
INTRAMUSCULAR | Status: DC | PRN
  Administered 2019-09-11: 3 mL

## 2019-09-11 MED ORDER — VASOPRESSIN 20 UNIT/ML IV SOLN
INTRAVENOUS | Status: AC
  Filled 2019-09-11: qty 1

## 2019-09-11 MED ORDER — DOCUSATE SODIUM 100 MG PO CAPS
100.0000 mg | ORAL_CAPSULE | Freq: Two times a day (BID) | ORAL | Status: DC
  Administered 2019-09-11 – 2019-09-16 (×10): 100 mg via ORAL
  Filled 2019-09-11 (×10): qty 1

## 2019-09-11 MED ORDER — ONDANSETRON HCL 4 MG/2ML IJ SOLN: 4.0000 mg | Freq: Once | INTRAMUSCULAR | Status: DC | PRN

## 2019-09-11 MED ORDER — SODIUM CHLORIDE 0.9 % IV SOLN
INTRAVENOUS | Status: DC | PRN
  Administered 2019-09-11 (×2): via INTRAVENOUS

## 2019-09-11 MED ORDER — CHLORHEXIDINE GLUCONATE CLOTH 2 % EX PADS
6.0000 | MEDICATED_PAD | Freq: Every day | CUTANEOUS | Status: DC
  Administered 2019-09-12 – 2019-09-13 (×2): 6 via TOPICAL

## 2019-09-11 MED ORDER — BUPIVACAINE HCL (PF) 0.5 % IJ SOLN
INTRAMUSCULAR | Status: DC | PRN
  Administered 2019-09-11: 30 mL

## 2019-09-11 MED ORDER — SENNOSIDES-DOCUSATE SODIUM 8.6-50 MG PO TABS: 1.0000 | ORAL_TABLET | Freq: Every evening | ORAL | Status: DC | PRN

## 2019-09-11 MED ORDER — BUPIVACAINE LIPOSOME 1.3 % IJ SUSP
INTRAMUSCULAR | Status: DC | PRN
  Administered 2019-09-11: 20 mL

## 2019-09-11 MED ORDER — SODIUM CHLORIDE FLUSH 0.9 % IV SOLN
INTRAVENOUS | Status: AC
  Filled 2019-09-11: qty 40

## 2019-09-11 MED ORDER — OXYCODONE HCL 5 MG PO TABS
5.0000 mg | ORAL_TABLET | ORAL | Status: DC | PRN
  Administered 2019-09-12 – 2019-09-14 (×8): 5 mg via ORAL
  Filled 2019-09-11 (×8): qty 1

## 2019-09-11 MED ORDER — TRAMADOL HCL 50 MG PO TABS
50.0000 mg | ORAL_TABLET | Freq: Four times a day (QID) | ORAL | Status: DC | PRN
  Administered 2019-09-12 – 2019-09-13 (×2): 50 mg via ORAL
  Filled 2019-09-11 (×2): qty 1

## 2019-09-11 MED ORDER — MIDAZOLAM HCL 5 MG/5ML IJ SOLN
INTRAMUSCULAR | Status: DC | PRN
  Administered 2019-09-11: 1 mg via INTRAVENOUS

## 2019-09-11 MED ORDER — NEOMYCIN-POLYMYXIN B GU 40-200000 IR SOLN
Status: DC | PRN
  Administered 2019-09-11: 16 mL

## 2019-09-11 MED ORDER — PHENYLEPHRINE HCL (PRESSORS) 10 MG/ML IV SOLN
INTRAVENOUS | Status: DC | PRN
  Administered 2019-09-11: 150 ug via INTRAVENOUS
  Administered 2019-09-11: 200 ug via INTRAVENOUS
  Administered 2019-09-11: 150 ug via INTRAVENOUS

## 2019-09-11 MED ORDER — QUINAPRIL HCL 10 MG PO TABS
40.0000 mg | ORAL_TABLET | Freq: Every day | ORAL | Status: DC
  Administered 2019-09-12 – 2019-09-16 (×5): 40 mg via ORAL
  Filled 2019-09-11 (×5): qty 4

## 2019-09-11 MED ORDER — HYDROMORPHONE HCL 1 MG/ML IJ SOLN
0.5000 mg | INTRAMUSCULAR | Status: DC | PRN
  Administered 2019-09-11: 0.5 mg via INTRAVENOUS
  Filled 2019-09-11: qty 1

## 2019-09-11 MED ORDER — MIDAZOLAM HCL 2 MG/2ML IJ SOLN
INTRAMUSCULAR | Status: AC
  Filled 2019-09-11: qty 2

## 2019-09-11 MED ORDER — PROPOFOL 10 MG/ML IV BOLUS
INTRAVENOUS | Status: DC | PRN
  Administered 2019-09-11: 30 mg via INTRAVENOUS

## 2019-09-11 MED ORDER — SODIUM CHLORIDE 0.9 % IV SOLN: INTRAVENOUS | Status: DC

## 2019-09-11 MED ORDER — ACETAMINOPHEN 10 MG/ML IV SOLN
INTRAVENOUS | Status: DC | PRN
  Administered 2019-09-11: 1000 mg via INTRAVENOUS

## 2019-09-11 MED ORDER — BUPIVACAINE LIPOSOME 1.3 % IJ SUSP
INTRAMUSCULAR | Status: AC
  Filled 2019-09-11: qty 20

## 2019-09-11 SURGICAL SUPPLY — 69 items
BLADE SAW SGTL 13X75X1.27 (BLADE) ×5 IMPLANT
BLADE SURG SZ10 CARB STEEL (BLADE) ×3 IMPLANT
BNDG COHESIVE 4X5 TAN STRL (GAUZE/BANDAGES/DRESSINGS) ×3 IMPLANT
CANISTER SUCT 1200ML W/VALVE (MISCELLANEOUS) ×3 IMPLANT
CANISTER SUCT 3000ML PPV (MISCELLANEOUS) ×6 IMPLANT
CHLORAPREP W/TINT 26 (MISCELLANEOUS) ×3 IMPLANT
COVER BACK TABLE REUSABLE LG (DRAPES) ×3 IMPLANT
COVER WAND RF STERILE (DRAPES) ×3 IMPLANT
DERMABOND ADVANCED (GAUZE/BANDAGES/DRESSINGS) ×2
DERMABOND ADVANCED .7 DNX12 (GAUZE/BANDAGES/DRESSINGS) ×1 IMPLANT
DRAPE 3/4 80X56 (DRAPES) ×9 IMPLANT
DRAPE INCISE IOBAN 66X60 STRL (DRAPES) ×3 IMPLANT
DRAPE SPLIT 6X30 W/TAPE (DRAPES) ×3 IMPLANT
DRAPE SURG 17X11 SM STRL (DRAPES) ×3 IMPLANT
DRSG OPSITE POSTOP 4X10 (GAUZE/BANDAGES/DRESSINGS) ×3 IMPLANT
DRSG OPSITE POSTOP 4X8 (GAUZE/BANDAGES/DRESSINGS) ×3 IMPLANT
ELECT BLADE 6.5 EXT (BLADE) ×3 IMPLANT
ELECT CAUTERY BLADE 6.4 (BLADE) ×3 IMPLANT
ELECT REM PT RETURN 9FT ADLT (ELECTROSURGICAL) ×3
ELECTRODE REM PT RTRN 9FT ADLT (ELECTROSURGICAL) ×1 IMPLANT
GAUZE SPONGE 4X4 12PLY STRL (GAUZE/BANDAGES/DRESSINGS) ×3 IMPLANT
GAUZE XEROFORM 1X8 LF (GAUZE/BANDAGES/DRESSINGS) ×3 IMPLANT
GLOVE BIOGEL PI IND STRL 8 (GLOVE) ×2 IMPLANT
GLOVE BIOGEL PI INDICATOR 8 (GLOVE) ×4
GLOVE SURG ORTHO 8.0 STRL STRW (GLOVE) ×6 IMPLANT
GOWN STRL REUS W/ TWL LRG LVL3 (GOWN DISPOSABLE) ×1 IMPLANT
GOWN STRL REUS W/ TWL XL LVL3 (GOWN DISPOSABLE) ×1 IMPLANT
GOWN STRL REUS W/TWL LRG LVL3 (GOWN DISPOSABLE) ×2
GOWN STRL REUS W/TWL XL LVL3 (GOWN DISPOSABLE) ×2
HEAD MODULAR ENDO (Orthopedic Implant) ×2 IMPLANT
HEAD UNPLR 55XMDLR STRL HIP (Orthopedic Implant) IMPLANT
HEMOVAC 400ML (MISCELLANEOUS)
HOLDER FOLEY CATH W/STRAP (MISCELLANEOUS) ×2 IMPLANT
KIT DRAIN HEMOVAC JP 7FR 400ML (MISCELLANEOUS) IMPLANT
KIT TURNOVER KIT A (KITS) ×3 IMPLANT
NDL FILTER BLUNT 18X1 1/2 (NEEDLE) ×1 IMPLANT
NDL MAYO CATGUT SZ4 TPR NDL (NEEDLE) ×1 IMPLANT
NDL SAFETY ECLIPSE 18X1.5 (NEEDLE) ×1 IMPLANT
NEEDLE FILTER BLUNT 18X 1/2SAF (NEEDLE) ×2
NEEDLE FILTER BLUNT 18X1 1/2 (NEEDLE) ×1 IMPLANT
NEEDLE HYPO 18GX1.5 SHARP (NEEDLE) ×2
NEEDLE HYPO 22GX1.5 SAFETY (NEEDLE) ×5 IMPLANT
NEEDLE MAYO CATGUT SZ4 (NEEDLE) ×3 IMPLANT
NEPTUNE MANIFOLD (MISCELLANEOUS) ×3 IMPLANT
NS IRRIG 1000ML POUR BTL (IV SOLUTION) ×3 IMPLANT
PACK HIP PROSTHESIS (MISCELLANEOUS) ×3 IMPLANT
PENCIL SMOKE ULTRAEVAC 22 CON (MISCELLANEOUS) ×3 IMPLANT
PILLOW ABDUCTION FOAM SM (MISCELLANEOUS) ×3 IMPLANT
PULSAVAC PLUS IRRIG FAN TIP (DISPOSABLE) ×3
RETRIEVER SUT HEWSON (MISCELLANEOUS) IMPLANT
SLEEVE UNITRAX V40 STD (Orthopedic Implant) ×2 IMPLANT
SOL .9 NS 3000ML IRR  AL (IV SOLUTION) ×2
SOL .9 NS 3000ML IRR UROMATIC (IV SOLUTION) ×1 IMPLANT
STAPLER SKIN PROX 35W (STAPLE) ×3 IMPLANT
STEM 37MM HIP (Hips) ×2 IMPLANT
SUT ETHIBOND #5 BRAIDED 30INL (SUTURE) ×3 IMPLANT
SUT MNCRL 4-0 (SUTURE) ×2
SUT MNCRL 4-0 27XMFL (SUTURE) ×1
SUT VIC AB 0 CT1 36 (SUTURE) ×5 IMPLANT
SUT VIC AB 2-0 CT2 27 (SUTURE) ×8 IMPLANT
SUTURE MNCRL 4-0 27XMF (SUTURE) ×1 IMPLANT
SYR 20ML LL LF (SYRINGE) ×3 IMPLANT
SYR 50ML LL SCALE MARK (SYRINGE) ×5 IMPLANT
TAPE MICROFOAM 4IN (TAPE) ×3 IMPLANT
TAPE TRANSPORE STRL 2 31045 (GAUZE/BANDAGES/DRESSINGS) ×3 IMPLANT
TIP BRUSH PULSAVAC PLUS 24.33 (MISCELLANEOUS) ×3 IMPLANT
TIP FAN IRRIG PULSAVAC PLUS (DISPOSABLE) ×1 IMPLANT
TRAY FOLEY SLVR 16FR LF STAT (SET/KITS/TRAYS/PACK) ×2 IMPLANT
TUBE SUCT KAM VAC (TUBING) ×3 IMPLANT

## 2019-09-11 NOTE — Anesthesia Procedure Notes (Signed)
Spinal  Patient location during procedure: OR Start time: 09/11/2019 2:21 PM End time: 09/11/2019 2:31 PM Staffing Anesthesiologist: Gunnar Fusi, MD Performed: anesthesiologist  Preanesthetic Checklist Completed: patient identified, site marked, surgical consent, pre-op evaluation, timeout performed, IV checked, risks and benefits discussed and monitors and equipment checked Spinal Block Patient position: left lateral decubitus Prep: DuraPrep Patient monitoring: heart rate, cardiac monitor, continuous pulse ox and blood pressure Approach: midline Location: L3-4 Injection technique: single-shot Needle Needle type: Sprotte  Needle gauge: 24 G Needle length: 9 cm Assessment Sensory level: T4

## 2019-09-11 NOTE — Anesthesia Post-op Follow-up Note (Signed)
Anesthesia QCDR form completed.        

## 2019-09-11 NOTE — Plan of Care (Signed)

## 2019-09-11 NOTE — Plan of Care (Signed)

## 2019-09-11 NOTE — H&P (Signed)
H&P reviewed. No significant changes noted.  

## 2019-09-11 NOTE — Transfer of Care (Signed)
Immediate Anesthesia Transfer of Care Note  Patient: Alexander Alvarado  Procedure(s) Performed: ARTHROPLASTY BIPOLAR HIP (HEMIARTHROPLASTY) (Right Hip)  Patient Location: PACU  Anesthesia Type:General and Spinal  Level of Consciousness: awake and drowsy  Airway & Oxygen Therapy: Patient Spontanous Breathing and Patient connected to face mask oxygen  Post-op Assessment: Report given to RN and Post -op Vital signs reviewed and stable  Post vital signs: Reviewed and stable  Last Vitals:  Vitals Value Taken Time  BP 200/161 09/11/19 1638  Temp    Pulse 88 09/11/19 1639  Resp 15 09/11/19 1639  SpO2 97 % 09/11/19 1639  Vitals shown include unvalidated device data.  Last Pain:  Vitals:   09/11/19 1333  TempSrc: Temporal  PainSc: 5          Complications: No apparent anesthesia complications

## 2019-09-11 NOTE — Progress Notes (Signed)
Pt offered urinal prior to surgery . Pt able to void 75 ml of very dark amber colored urine. Patient states '' I get dehydrated easily and I've not had anything to eat or drink.. '' notified Dr. Ronelle Nigh of above and orders received for 500 ml NS bolus.

## 2019-09-11 NOTE — Op Note (Signed)
DATE OF SURGERY: 09/11/2019  PREOPERATIVE DIAGNOSIS: Right femoral neck fracture  POSTOPERATIVE DIAGNOSIS: Right femoral neck fracture  PROCEDURE: Right hip hemiarthroplasty  SURGEON: Cato Mulligan, MD  ASSISTANT: Verne Carrow, PA-S  ANESTHESIA: spinal  EBL: 150 cc  COMPONENTS:  Stryker - Accolade II Size 7 Stem Stryker - Unitrax 13mm head with neutral neck   INDICATIONS: FLOID KETTERLING is a 78 y.o. male who sustained a displaced femoral neck fracture after a fall. Risks and benefits of hip hemiarthroplasty were explained to the patient and/or family. Risks include but are not limited to bleeding, infection, injury to tissues, nerves, vessels, periprosthetic infection, dislocation, limb length discrepancy and risks of anesthesia. The patient and/or family understands these risks, has completed an informed consent and wishes to proceed.   PROCEDURE:  The patient was identified in the preoperative holding area and the operative extremity was marked.  The patient was then transferred to the operating room suite and mobilized from the hospital gurney to the operating room table. Anesthesia was administered without complication. The patient was then transitioned to a lateral position.  All bony prominences were padded per protocol.  An axillary roll was placed.  Careful attention was paid to the contralateral side peroneal nerve, which was free from pressure with use of appropriate padding and blankets. A time-out was performed to confirm the patient's identity and the correct laterality of surgery. The patient was then prepped and draped in the usual sterile fashion. Appropriate pre-operative antibiotics were administered.    An incision that centered on the posterior tip of the greater trochanter with a posterior curve was made. Dissection was carried down through the subcutaneous tissue.  Careful attention was made to maintain hemostasis using electrocautery.  Dissection brought [place] to  the level of the deep fascia where the gluteus maximus muscle and proximal portion of the IT band were identified.  The proximal region of the IT band was incised in linear fashion and this incision was extended proximally in a curvilinear fashion to split the gluteus maximus muscle parallel to its fibers to minimize bleeding.  This was accomplished using a combination of bovie electrocautery as well as blunt dissection.  The trochanteric bursa was then visualized and dissected from anterior to posterior. A blunt homan retractor was placed beneath the abductors. The piriformis tendon and short external rotators were visualized. Bovie electrocautery was used to cut these with the capsule as one L-shaped flap. This was tagged at the corner with #5 Ethibond. At this point, the femoral neck fracture was visualized. An oscillating saw was used to make a new neck cut approximately 59mm above the lesser tuberosity with the use of a neck cut guide. The head was then freed from its remaining soft tissue attachments and measured. The head trial was then inserted into the acetabulum and the appropriate sized head was selected.    We then turned our attention to preparing the femoral canal. First, a box cut was performed utilizing the box osteotome. A canal finder was inserted by hand and sequential broaching was then performed. The calcar planer was inserted onto the broach and used to smooth the calcar appropriately.  A trial stem, neutral neck, and head were inserted into the acetabulum and placed through range of motion. Intraoperative radiographs were obtained to assess component position and leg length. The hip was again dislocated and the femoral trial components were removed.  The actual stem was inserted into the femoral canal and then driven onto the calcar. The  trial head was then again inserted on the femoral component and found to be appropriate. The trial head was then removed and the permanent head was Morse  tapered onto the femoral stem and then reduced into the acetabulum.    The hip stability and length were reassessed and found to be satisfactory.  The wound was then copiously irrigated with normal saline solution. The tagged sutures of the capsule and piriformis were sewn to the gluteus medius tendon. This adequately closed the hip capsule. The IT band and gluteus maximus fascia were then closed with 0-Vicryl in a running, locked fashion. A mixture of Exparil and bupivicaine was administered.  The subdermal layer was closed with 2-0 Vicryl in a buried interrupted fashion. Skin was approximated staples.  The wound was then covered with Xeroform and Honeycomb dressing.  An abduction pillow was placed. The patient was mobilized from the lateral position back to supine on the operating room table and then awakened from anesthesia without complication.  POSTOPERATIVE PLAN: The patient will be WBAT on operative extremity. Lovenox 40mg /day x 4 weeks to start on POD#1. Ancef x 24 hours. PT/OT on POD#1. Posterior hip precautions.

## 2019-09-11 NOTE — Progress Notes (Addendum)
    BRIEF OVERNIGHT PROGRESS REPORT   SUBJECTIVE:Notified by orthopaedic surgeon Dr. Leim Fabry of coffee ground type emesis seen in patient's airways by Anesthesia near the end of the surgery. Would like monitoring and or further work up if indicated.  OBJECTIVE: Patient examined at the bedside. He was afebrile with blood pressure 156/80 mm Hg and pulse rate 102 beats/min. There were no focal neurological deficits; he was alert and oriented x4, and he did not demonstrate any respiratory symptoms,  ASSESSMENT:78 y.o. male with medical history significant of CVA with residual left-sided weakness, peripheral vascular disease s/p left foot toe amputation, hypertension hyperlipidemia presenting with right hip pain following mechanical fall. Now s/p right hip hemiarthroplasty POD # 0  PLAN: 1. Suspected upper GI bleed - Patient noted with coffee ground emesis during procedure. No evidence of active bleeding or hemodynamic instability on exam. Unclear cause of hematemesis * Hx of undelying chronic polycythemia? hgb 20.0 - H&H monitoring q6h - Blood Consent.  Transfuse PRN Hgb<8 - Consider  GI Consult for EGD/Colonoscopy if appropriately indicated - Hold NSAIDs, steroids, ASA  2. Right femoral neck fracture - status post right hip hemiarthroplasty POD # 0 - PRN pain management - Ortho input appreciated - PT/OT consult per primary team  3. Hx of CVA - Hold Aspirin as above pending eval  4. HTN  + Goal BP <130/80 - Continue Amlodipine, Metoprolol, Clonidine and  Quinapril  5. HLD  + Goal LDL<100 - Atorvastatin 20mg  PO qhs  DVT prophylaxis - Restart Enoxaparin SubQ once GI bleed ruled out?    Rufina Falco, DNP, CCRN, FNP-C [facility] Nurse Practitioner Between 7pm to 7am - Pager [phone]  After 7am go to [email or web address] - [email or web address]  [facility] [employer]  [phone]

## 2019-09-11 NOTE — Progress Notes (Signed)
Patient returned to unit from PACU. Awake but says he is sleepy.  Ice pack to right hip. Surgical dressing reinforced with 4x4 gauze cover sponge. 02@ 3/L per [place].  IS at bedside and instructions on use. Will monitor use.   IV NS infusing at 75/hr.  No distress noted. Will continue to follow pt.

## 2019-09-11 NOTE — Anesthesia Preprocedure Evaluation (Addendum)
Anesthesia Evaluation  Patient identified by MRN, date of birth, ID band Patient awake    Reviewed: Allergy & Precautions, NPO status , Patient's Chart, lab work & pertinent test results  History of Anesthesia Complications Negative for: history of anesthetic complications  Airway Mallampati: III       Dental  (+) Edentulous Upper, Edentulous Lower   Pulmonary neg sleep apnea, neg COPD, Current Smoker and Patient abstained from smoking.,           Cardiovascular hypertension, Pt. on medications (-) Past MI and (-) CHF (-) dysrhythmias (-) Valvular Problems/Murmurs     Neuro/Psych neg Seizures Depression CVA (L sisded weakness), Residual Symptoms    GI/Hepatic Neg liver ROS, GERD  Medicated,  Endo/Other  neg diabetes  Renal/GU negative Renal ROS     Musculoskeletal   Abdominal   Peds  Hematology   Anesthesia Other Findings   Reproductive/Obstetrics                             Anesthesia Physical Anesthesia Plan  ASA: III  Anesthesia Plan: Spinal   Post-op Pain Management:    Induction:   PONV Risk Score and Plan:   Airway Management Planned:   Additional Equipment:   Intra-op Plan:   Post-operative Plan:   Informed Consent: I have reviewed the patients History and Physical, chart, labs and discussed the procedure including the risks, benefits and alternatives for the proposed anesthesia with the patient or authorized representative who has indicated his/her understanding and acceptance.       Plan Discussed with:   Anesthesia Plan Comments: (Pt with previous history of CVA and CAD and MI, not a good candidate for TXA. Discussed with Dr. Posey Pronto. )       Anesthesia Quick Evaluation

## 2019-09-11 NOTE — Progress Notes (Signed)
PROGRESS NOTE    Alexander Alvarado  [id number] DOB: 17-Jan-1941 DOA: 09/10/2019 PCP: Glean Hess, MD      Brief Narrative:  Alexander Alvarado is a 78 y.o. M with hx CVA residual left hemiparesis, chronic polycythemia, HTN, PVD s/p left partial foot amputation and ALF dwelling who presented with mechanical fall and left hip pain.  Patient was bending over when he lost his balance and fell.  No chest pain or loss of consciousness at the time of the fall.  In the ER, x-ray showed right femoral neck fracture.  Orthopedics were consulted and recommended operative repair.      Assessment & Plan:  Acute right femoral neck fracture The patient is medically cleared for surgery.  Noted that he has an increased perioperative risk of stroke probably due to polycythemia previous history of stroke.   -Consult orthopedics   Cerebrovascular disease secondary prevention Peripheral vascular disease secondary prevention Hypertension Blood pressure slightly elevated overnight -Continue amlodipine, clonidine, metoprolol -Resume quinapril postop -Continue atorvastatin, aspirin  Erythrocytosis This appears to be chronic polycythemia.  Likely secondary polycythemia from smoking.  This does increase his risk of perioperative stroke.  Reasonable to obtain Jak testing. -Follow EPO and JAK level  Leukocytosis Likely reactive        MDM and disposition: The below labs and imaging reports were reviewed and summarized above.  Medication management as above.  The patient was admitted with acute right femoral neck fracture.        DVT prophylaxis: SCD Code Status: DO NOT RESUSCITATE Family Communication:     Consultants:   Orthopedics  Procedures:     Antimicrobials:       Subjective: Patient threw up overnight, he has persistent nausea.  He has persistent right hip pain.  No chest pain, palpitations, orthopnea, dyspnea, leg swelling.  Objective: Vitals:   09/10/19 2135  09/10/19 2303 09/11/19 0524 09/11/19 0740  BP:  140/77 (!) 155/81 (!) 158/88  Pulse:  84 90 91  Resp:  15 18 18   Temp:  98.4 F (36.9 C) 98.9 F (37.2 C) 99 F (37.2 C)  TempSrc:  Oral Oral Oral  SpO2: 92% (!) 84% 91% 90%  Weight:      Height:        Intake/Output Summary (Last 24 hours) at 09/11/2019 0857 Last data filed at 09/11/2019 0600 Gross per 24 hour  Intake 200 ml  Output 75 ml  Net 125 ml   Filed Weights   09/10/19 1517  Weight: 77.1 kg    Examination: General appearance: Thin elderly adult male, alert and in no acute distress.   HEENT: Anicteric, conjunctiva pink, lids and lashes normal. No nasal deformity, discharge, epistaxis.  Lips moist, dentures in place, oropharynx moist, no oral lesions, hearing normal.   Skin: Warm and dry.  No jaundice.  No suspicious rashes or lesions. Cardiac: RRR, nl S1-S2, no murmurs appreciated.  Capillary refill is brisk.  JVP normal.  No LE edema.  Radial pulses 2+ and symmetric. Respiratory: Normal respiratory rate and rhythm.  CTAB without rales or wheezes. Abdomen: Abdomen soft.  No TTP or guarding. No ascites, distension, hepatosplenomegaly.   MSK: Mild loss of subcutaneous muscle mass and fat.  The right hip is foreshortened and externally rotated. Neuro: Awake and alert.  EOMI, has some noted left arm weakness relative to the right, leg testing not attempted due to pain. Speech fluent.    Psych: Sensorium intact and responding to questions, attention normal. Affect normal.  Judgment and insight appear normal.    Data Reviewed: I have personally reviewed following labs and imaging studies:  CBC: Recent Labs  Lab 09/10/19 1703 09/11/19 0444  WBC 21.9* 17.3*  NEUTROABS 18.2*  --   HGB 20.6* 20.0*  HCT 59.1* 58.7*  MCV 91.2 92.7  PLT 171 0000000   Basic Metabolic Panel: Recent Labs  Lab 09/10/19 1703 09/11/19 0444  NA 136 137  K 3.6 4.0  CL 99 100  CO2 25 25  GLUCOSE 118* 120*  BUN 12 16  CREATININE 0.81 0.76    CALCIUM 9.3 8.9   GFR: Estimated Creatinine Clearance: 83.7 mL/min (by C-G formula based on SCr of 0.76 mg/dL). Liver Function Tests: Recent Labs  Lab 09/10/19 1703  AST 32  ALT 19  ALKPHOS 138*  BILITOT 1.5*  PROT 7.6  ALBUMIN 4.3   No results for input(s): LIPASE, AMYLASE in the last 168 hours. No results for input(s): AMMONIA in the last 168 hours. Coagulation Profile: Recent Labs  Lab 09/10/19 1703  INR 1.1   Cardiac Enzymes: No results for input(s): CKTOTAL, CKMB, CKMBINDEX, TROPONINI in the last 168 hours. BNP (last 3 results) No results for input(s): PROBNP in the last 8760 hours. HbA1C: No results for input(s): HGBA1C in the last 72 hours. CBG: No results for input(s): GLUCAP in the last 168 hours. Lipid Profile: No results for input(s): CHOL, HDL, LDLCALC, TRIG, CHOLHDL, LDLDIRECT in the last 72 hours. Thyroid Function Tests: No results for input(s): TSH, T4TOTAL, FREET4, T3FREE, THYROIDAB in the last 72 hours. Anemia Panel: No results for input(s): VITAMINB12, FOLATE, FERRITIN, TIBC, IRON, RETICCTPCT in the last 72 hours. Urine analysis:    Component Value Date/Time   COLORURINE Yellow 07/14/2012 2232   APPEARANCEUR Hazy 07/14/2012 2232   LABSPEC 1.024 07/14/2012 2232   PHURINE 6.0 07/14/2012 2232   GLUCOSEU Negative 07/14/2012 2232   HGBUR Negative 07/14/2012 2232   BILIRUBINUR neg 02/10/2019 0847   BILIRUBINUR Negative 07/14/2012 2232   KETONESUR 1+ 07/14/2012 2232   PROTEINUR Negative 02/10/2019 0847   PROTEINUR Negative 07/14/2012 2232   UROBILINOGEN 0.2 02/10/2019 0847   NITRITE neg 02/10/2019 0847   NITRITE Negative 07/14/2012 2232   LEUKOCYTESUR Negative 02/10/2019 0847   LEUKOCYTESUR Negative 07/14/2012 2232   Sepsis Labs: @LABRCNTIP (procalcitonin:4,lacticacidven:4)  ) Recent Results (from the past 240 hour(s))  SARS CORONAVIRUS 2 (TAT 6-24 HRS) Nasopharyngeal Nasopharyngeal Swab     Status: None   Collection Time: 09/10/19  5:13 PM    Specimen: Nasopharyngeal Swab  Result Value Ref Range Status   SARS Coronavirus 2 NEGATIVE NEGATIVE Final    Comment: (NOTE) SARS-CoV-2 target nucleic acids are NOT DETECTED. The SARS-CoV-2 RNA is generally detectable in upper and lower respiratory specimens during the acute phase of infection. Negative results do not preclude SARS-CoV-2 infection, do not rule out co-infections with other pathogens, and should not be used as the sole basis for treatment or other patient management decisions. Negative results must be combined with clinical observations, patient history, and epidemiological information. The expected result is Negative. Fact Sheet for Patients: SugarRoll.be Fact Sheet for Healthcare Providers: https://www.woods-mathews.com/ This test is not yet approved or cleared by the [place] FDA and  has been authorized for detection and/or diagnosis of SARS-CoV-2 by FDA under an Emergency Use Authorization (EUA). This EUA will remain  in effect (meaning this test can be used) for the duration of the COVID-19 declaration under Section 56 4(b)(1) of the Act, 21 U.S.C. section 360bbb-3(b)(1), unless  the authorization is terminated or revoked sooner. Performed at [facility], [place] [address], [place], [place] [address]          Radiology Studies: Ct Head Wo Contrast  Result Date: 09/10/2019 CLINICAL DATA:  Mechanical fall, no reported head strike EXAM: CT HEAD WITHOUT CONTRAST CT CERVICAL SPINE WITHOUT CONTRAST TECHNIQUE: Multidetector CT imaging of the head and cervical spine was performed following the standard protocol without intravenous contrast. Multiplanar CT image reconstructions of the cervical spine were also generated. COMPARISON:  None. FINDINGS: CT HEAD FINDINGS Brain: Regions of gliosis in the bilateral basal ganglia and left cerebellar hemisphere likely reflects sequela of prior infarct. No evidence of acute  infarction, hemorrhage, hydrocephalus, extra-axial collection or mass lesion/mass effect. Symmetric prominence of the ventricles, cisterns and sulci compatible with parenchymal volume loss. Patchy areas of white matter hypoattenuation are most compatible with chronic microvascular angiopathy. Vascular: Atherosclerotic calcification of the carotid siphons and intradural vertebral arteries. No hyperdense vessel. Skull: No calvarial fracture or suspicious osseous lesion. No scalp swelling or hematoma. Sinuses/Orbits: Paranasal sinuses and mastoid air cells are predominantly clear. Included orbital structures are unremarkable. Other: None CT CERVICAL SPINE FINDINGS Alignment: Preservation of the normal cervical lordosis without traumatic listhesis. No abnormal facet widening. Normal alignment of the craniocervical and atlantoaxial articulations. Degenerative 2 mm of anterolisthesis C5 on C6. No traumatic listhesis. Posterior elements are normally aligned. Craniocervical and atlantoaxial alignment is maintained accounting for slight rotation of the patient's head. Skull base and vertebrae: The osseous structures appear diffusely demineralized which may limit detection of small or nondisplaced fractures. No acute fracture. No primary bone lesion or focal pathologic process. Soft tissues and spinal canal: No pre or paravertebral fluid or swelling. No visible canal hematoma. Disc levels: Multilevel intervertebral disc height loss with spondylitic endplate changes. Multilevel disc osteophyte complexes result in at most mild canal stenosis most pronounced at C3-4 secondary to a combination of disc osteophyte complex and posterior ligamentum flavum infolding. Diffuse uncinate spurring and facet hypertrophic changes result in mild-to-moderate multilevel neural foraminal narrowing. No severe neural foraminal stenosis is evident. Upper chest: Some abundant subpleural fat noted in the apices with mild apical pleuroparenchymal  scarring and features of emphysema. Other: Normal thyroid. Cervical carotid atherosclerosis and calcification of the proximal right subclavian artery. IMPRESSION: 1. No acute intracranial abnormality. No scalp swelling or calvarial fracture. 2. Old infarcts in the bilateral basal ganglia and left cerebellar hemisphere. 3. Moderate parenchymal volume loss and chronic microvascular angiopathy. 4. No acute cervical spine fracture. Diffuse osseous demineralization may limit detection of small or nondisplaced fractures. 5. Cervical spondylitic and facet degenerative changes, as above. Electronically Signed   By: Lovena Le M.D.   On: 09/10/2019 16:49   Ct Cervical Spine Wo Contrast  Result Date: 09/10/2019 CLINICAL DATA:  Mechanical fall, no reported head strike EXAM: CT HEAD WITHOUT CONTRAST CT CERVICAL SPINE WITHOUT CONTRAST TECHNIQUE: Multidetector CT imaging of the head and cervical spine was performed following the standard protocol without intravenous contrast. Multiplanar CT image reconstructions of the cervical spine were also generated. COMPARISON:  None. FINDINGS: CT HEAD FINDINGS Brain: Regions of gliosis in the bilateral basal ganglia and left cerebellar hemisphere likely reflects sequela of prior infarct. No evidence of acute infarction, hemorrhage, hydrocephalus, extra-axial collection or mass lesion/mass effect. Symmetric prominence of the ventricles, cisterns and sulci compatible with parenchymal volume loss. Patchy areas of white matter hypoattenuation are most compatible with chronic microvascular angiopathy. Vascular: Atherosclerotic calcification of the carotid siphons and  intradural vertebral arteries. No hyperdense vessel. Skull: No calvarial fracture or suspicious osseous lesion. No scalp swelling or hematoma. Sinuses/Orbits: Paranasal sinuses and mastoid air cells are predominantly clear. Included orbital structures are unremarkable. Other: None CT CERVICAL SPINE FINDINGS Alignment:  Preservation of the normal cervical lordosis without traumatic listhesis. No abnormal facet widening. Normal alignment of the craniocervical and atlantoaxial articulations. Degenerative 2 mm of anterolisthesis C5 on C6. No traumatic listhesis. Posterior elements are normally aligned. Craniocervical and atlantoaxial alignment is maintained accounting for slight rotation of the patient's head. Skull base and vertebrae: The osseous structures appear diffusely demineralized which may limit detection of small or nondisplaced fractures. No acute fracture. No primary bone lesion or focal pathologic process. Soft tissues and spinal canal: No pre or paravertebral fluid or swelling. No visible canal hematoma. Disc levels: Multilevel intervertebral disc height loss with spondylitic endplate changes. Multilevel disc osteophyte complexes result in at most mild canal stenosis most pronounced at C3-4 secondary to a combination of disc osteophyte complex and posterior ligamentum flavum infolding. Diffuse uncinate spurring and facet hypertrophic changes result in mild-to-moderate multilevel neural foraminal narrowing. No severe neural foraminal stenosis is evident. Upper chest: Some abundant subpleural fat noted in the apices with mild apical pleuroparenchymal scarring and features of emphysema. Other: Normal thyroid. Cervical carotid atherosclerosis and calcification of the proximal right subclavian artery. IMPRESSION: 1. No acute intracranial abnormality. No scalp swelling or calvarial fracture. 2. Old infarcts in the bilateral basal ganglia and left cerebellar hemisphere. 3. Moderate parenchymal volume loss and chronic microvascular angiopathy. 4. No acute cervical spine fracture. Diffuse osseous demineralization may limit detection of small or nondisplaced fractures. 5. Cervical spondylitic and facet degenerative changes, as above. Electronically Signed   By: Lovena Le M.D.   On: 09/10/2019 16:49   Ct Hip Right Wo  Contrast  Result Date: 09/10/2019 CLINICAL DATA:  Right hip pain secondary to a fall at home. EXAM: CT OF THE RIGHT HIP WITHOUT CONTRAST TECHNIQUE: Multidetector CT imaging of the right hip was performed according to the standard protocol. Multiplanar CT image reconstructions were also generated. COMPARISON:  CT scan of the abdomen and pelvis dated 07/15/2012 FINDINGS: Bones/Joint/Cartilage There is an impacted fracture of the proximal right femoral neck. No dislocation. The visualized pelvic bones are intact. No significant arthritic changes of right hip joint. Muscles and Tendons Normal. Soft tissues No acute abnormality. Fem-fem bypass graft. IMPRESSION: Impacted fracture of the proximal right femoral neck. Electronically Signed   By: Lorriane Shire M.D.   On: 09/10/2019 16:47   Dg Chest Portable 1 View  Result Date: 09/10/2019 CLINICAL DATA:  Fall with right hip pain EXAM: PORTABLE CHEST 1 VIEW COMPARISON:  07/14/2012 FINDINGS: Mild reticular opacity at the periphery and lung bases, suspicious for chronic interstitial lung disease. No consolidation or effusion. Normal heart size. Aortic atherosclerosis. No pneumothorax. IMPRESSION: Mild peripheral and basilar reticular opacities, suspicious for chronic interstitial lung disease. No acute airspace disease is seen. Electronically Signed   By: Donavan Foil M.D.   On: 09/10/2019 16:49   Dg Hip Unilat With Pelvis 2-3 Views Right  Result Date: 09/10/2019 CLINICAL DATA:  Right hip pain after a fall EXAM: DG HIP (WITH OR WITHOUT PELVIS) 2-3V RIGHT COMPARISON:  CT of the right hip from the same day. FINDINGS: There is an impacted subcapital right femoral neck fracture which is not significantly changed since prior CT. There is no dislocation of either hip. Degenerative changes are seen in the lumbar spine. A vascular stent  overlies the left iliac artery. IMPRESSION: 1. Impacted subcapital right femoral neck fracture. No dislocation. 2. Electronically Signed    By: Zerita Boers M.D.   On: 09/10/2019 19:27        Scheduled Meds:  amLODipine  10 mg Oral Daily   atorvastatin  20 mg Oral QHS   cloNIDine  0.1 mg Oral BID   folic acid  1 mg Oral Daily   influenza vaccine adjuvanted  0.5 mL Intramuscular Tomorrow-1000   metoprolol tartrate  25 mg Oral BID   pantoprazole  40 mg Oral Daily   pneumococcal 23 valent vaccine  0.5 mL Intramuscular Tomorrow-1000   quinapril  40 mg Oral Daily   senna  1 tablet Oral BID   traZODone  50-100 mg Oral QHS   cyanocobalamin  1,000 mcg Oral Daily   Continuous Infusions:   ceFAZolin (ANCEF) IV Stopped (09/11/19 0546)     LOS: 1 day    Time spent: 25 minutes    Edwin Dada, MD [facility] 09/11/2019, 8:57 AM     Please page though [place] or Epic secure chat:  For [employer], Adult nurse

## 2019-09-12 DIAGNOSIS — S98132A Complete traumatic amputation of one left lesser toe, initial encounter: Secondary | ICD-10-CM

## 2019-09-12 DIAGNOSIS — J9601 Acute respiratory failure with hypoxia: Secondary | ICD-10-CM

## 2019-09-12 LAB — CBC
HCT: 48.4 % (ref 39.0–52.0)
Hemoglobin: 16.9 g/dL (ref 13.0–17.0)
MCH: 31.9 pg (ref 26.0–34.0)
MCHC: 34.9 g/dL (ref 30.0–36.0)
MCV: 91.5 fL (ref 80.0–100.0)
Platelets: 129 10*3/uL — ABNORMAL LOW (ref 150–400)
RBC: 5.29 MIL/uL (ref 4.22–5.81)
RDW: 13.2 % (ref 11.5–15.5)
WBC: 17 10*3/uL — ABNORMAL HIGH (ref 4.0–10.5)
nRBC: 0 % (ref 0.0–0.2)

## 2019-09-12 LAB — BLOOD GAS, ARTERIAL
Acid-Base Excess: 5.3 mmol/L — ABNORMAL HIGH (ref 0.0–2.0)
Bicarbonate: 30.5 mmol/L — ABNORMAL HIGH (ref 20.0–28.0)
FIO2: 100
O2 Saturation: 94.5 %
Patient temperature: 37
pCO2 arterial: 46 mmHg (ref 32.0–48.0)
pH, Arterial: 7.43 (ref 7.350–7.450)
pO2, Arterial: 71 mmHg — ABNORMAL LOW (ref 83.0–108.0)

## 2019-09-12 LAB — BASIC METABOLIC PANEL
Anion gap: 11 (ref 5–15)
BUN: 21 mg/dL (ref 8–23)
CO2: 26 mmol/L (ref 22–32)
Calcium: 8.1 mg/dL — ABNORMAL LOW (ref 8.9–10.3)
Chloride: 98 mmol/L (ref 98–111)
Creatinine, Ser: 0.7 mg/dL (ref 0.61–1.24)
GFR calc Af Amer: >60 mL/min (ref >60)
GFR calc non Af Amer: >60 mL/min (ref >60)
Glucose, Bld: 115 mg/dL — ABNORMAL HIGH (ref 70–99)
Potassium: 3.5 mmol/L (ref 3.5–5.1)
Sodium: 135 mmol/L (ref 135–145)

## 2019-09-12 MED ORDER — FUROSEMIDE 10 MG/ML IJ SOLN
40.0000 mg | Freq: Once | INTRAMUSCULAR | Status: AC
  Administered 2019-09-12: 40 mg via INTRAVENOUS
  Filled 2019-09-12: qty 4

## 2019-09-12 MED ORDER — SODIUM CHLORIDE 0.9 % IV SOLN
1.0000 g | INTRAVENOUS | Status: AC
  Administered 2019-09-12 – 2019-09-16 (×5): 1 g via INTRAVENOUS
  Filled 2019-09-12 (×5): qty 1

## 2019-09-12 MED ORDER — ACETAMINOPHEN 10 MG/ML IV SOLN: 1000.0000 mg | Freq: Four times a day (QID) | INTRAVENOUS | Status: DC

## 2019-09-12 MED ORDER — NICOTINE 14 MG/24HR TD PT24
14.0000 mg | MEDICATED_PATCH | Freq: Every day | TRANSDERMAL | Status: DC
  Administered 2019-09-13 – 2019-09-16 (×5): 14 mg via TRANSDERMAL
  Filled 2019-09-12 (×5): qty 1

## 2019-09-12 NOTE — Progress Notes (Signed)
  Subjective: 1 Day Post-Op Procedure(s) (LRB): ARTHROPLASTY BIPOLAR HIP (HEMIARTHROPLASTY) (Right) Patient reports pain as moderate.   Patient seen in rounds with Dr. Marry Guan. Patient is well, and has had no acute complaints or problems Plan is to go Rehab after hospital stay. Negative for chest pain and shortness of breath Fever: no Gastrointestinal: Negative for nausea and vomiting  Objective: Vital signs in last 24 hours: Temp:  [97.7 F (36.5 C)-99 F (37.2 C)] 97.7 F (36.5 C) (11/21 0100) Pulse Rate:  [82-106] 83 (11/21 0100) Resp:  [10-22] 18 (11/21 0100) BP: (114-198)/(69-176) 139/81 (11/21 0100) SpO2:  [89 %-96 %] 89 % (11/21 0100)  Intake/Output from previous day:  Intake/Output Summary (Last 24 hours) at 09/12/2019 0639 Last data filed at 09/12/2019 0524 Gross per 24 hour  Intake 1400 ml  Output 815 ml  Net 585 ml    Intake/Output this shift: Total I/O In: 200 [IV Piggyback:200] Out: 450 [Urine:450]  Labs: Recent Labs    09/10/19 1703 09/11/19 0444 09/12/19 0409  HGB 20.6* 20.0* 16.9   Recent Labs    09/11/19 0444 09/12/19 0409  WBC 17.3* 17.0*  RBC 6.33* 5.29  HCT 58.7* 48.4  PLT 162 129*   Recent Labs    09/11/19 0444 09/12/19 0409  NA 137 135  K 4.0 3.5  CL 100 98  CO2 25 26  BUN 16 21  CREATININE 0.76 0.70  GLUCOSE 120* 115*  CALCIUM 8.9 8.1*   Recent Labs    09/10/19 1703  INR 1.1     EXAM General - Patient is Alert and Oriented Extremity - Neurovascular intact Sensation intact distally Dorsiflexion/Plantar flexion intact Compartment soft Dressing/Incision - clean, dry, no drainage Motor Function - intact, moving foot and toes well on exam.   Past Medical History:  Diagnosis Date  . Depression   . GERD (gastroesophageal reflux disease)   . High cholesterol   . Hypertension   . PVD (peripheral vascular disease) ([place])   . Stroke [facility])    left side weakness    Assessment/Plan: 1 Day Post-Op Procedure(s)  (LRB): ARTHROPLASTY BIPOLAR HIP (HEMIARTHROPLASTY) (Right) Active Problems:   Essential (primary) hypertension   Gastro-esophageal reflux disease without esophagitis   Hyperlipidemia, mixed   Insomnia, persistent   Peripheral vascular disease ([place])   Cerebrovascular accident (CVA) due to occlusion of cerebral artery (HCC)   Amputation of toe of left foot ([place])   Femur fracture, right ([place])  Estimated body mass index is 23.71 kg/m as calculated from the following:   Height as of this encounter: 5\' 11"  (1.803 m).   Weight as of this encounter: 77.1 kg. Advance diet Up with therapy D/C IV fluids  Discharge planning.  Follow-up at [facility] clinic in 2 weeks for staple removal.  DVT Prophylaxis - Lovenox, Foot Pumps and TED hose Weight-Bearing as tolerated to right leg  Reche Dixon, PA-C Orthopaedic Surgery 09/12/2019, 6:39 AM

## 2019-09-12 NOTE — Progress Notes (Signed)
Dr Loleta Books notified of low O2 sats on 4L. MD called a rapid response. Patient placed on nonrebreather and sats are 92%. ABG's were ordered by MD. Patient stable, falling asleep between care.

## 2019-09-12 NOTE — Progress Notes (Signed)
PROGRESS NOTE    Alexander Alvarado  [id number] DOB: April 04, 1941 DOA: 09/10/2019 PCP: Glean Hess, MD      Brief Narrative:  [name] is a 78 y.o. M with hx CVA residual left hemiparesis, chronic polycythemia, HTN, smoking, PVD s/p left partial foot amputation and ALF dwelling who presented with mechanical fall and left hip pain.  Patient was bending over when he lost his balance and fell.  No chest pain or loss of consciousness at the time of the fall.  In the ER, x-ray showed right femoral neck fracture.  Orthopedics were consulted and recommended operative repair.       Assessment & Plan:  Acute right femoral neck fracture S/p RIGHT hemiarthroplasty 11/20 by Dr. Posey Pronto Intraoperative course complicated by aspiration into chest.  Anesthesiology told nursing who told my partner overnight that this might have been "coffee ground" aspirate.  Hgb and Cr stable this morning. -Consult orthopedics, appreciate cares -PT eval   Acute hypoxic respiratory failure due to aspiration pneumonia Altered mental status Patient with increasing O2 needs overnight.  This AM 77-90% on 4L O2 and somnolent.  Rapid response called.  CXR personally reviewed from overnight, show s bibasilar opacities, interstitial change consistent with edema or infection. ABG 7.4, 46, 71, 30 Neurological exam nonfocal, doubt stroke.  Suspect this is metabolic enchpalopathy, medication and hypoxia related. -Start ceftriaxone -Lasix IV x1 -Minimize opiates -T/C/IS and flutter q2 -I recommend transfer to Stepdown  Possible hematemesis There is hearsay that the patient had coffee ground emesis intraop. Currently he is hemodynamically stable.  Hgb no change. -Continue PPI -FOBT stool  Cerebrovascular disease secondary prevention Peripheral vascular disease secondary prevention Hypertension Blood pressure slightly up -Continue amlodipine, clonidine, metoprolol -Restart quinapril -Continue atorvastatin,  aspirin  Erythrocytosis This appears to be chronic polycythemia.  Likely secondary polycythemia from smoking.  This does increase his risk of perioperative stroke.  Reasonable to obtain Jak testing. -Follow EPO and JAK level         MDM and disposition: The below labs and imaging reports reviewed and summarized above.  Medication management as above.   The patient was admitted with acute right femoral neck fracture.    Intraoperative course complicated now by aspiration and hypoxic respiratory failure  Will transfer and start antibiotics.     DVT prophylaxis: SCD Code Status: DO NOT RESUSCITATE Family Communication:     Consultants:   Orthopedics  Procedures:     Antimicrobials:    Ceftriaxone 11/21 >>   Subjective: Suregery yesterday, intraoperative aspiration.  Now with confusion, worsning hypoxia.  Feels "bad all over".  Nauseated.  No focal weakness.  No focal numbness.  Oriented to self, situation.        Objective: Vitals:   09/12/19 0809 09/12/19 0812 09/12/19 0819 09/12/19 0823  BP: (!) 156/79     Pulse: 67     Resp: 16     Temp:      TempSrc:      SpO2: (!) 80% (!) 82% (!) 86% 91%  Weight:      Height:        Intake/Output Summary (Last 24 hours) at 09/12/2019 0830 Last data filed at 09/12/2019 0524 Gross per 24 hour  Intake 1400 ml  Output 815 ml  Net 585 ml   Filed Weights   09/10/19 1517  Weight: 77.1 kg    Examination: General appearance: elderly, chronically ill appearing adult male, alert and in no obvious distress.  But "stunned" appearing HEENT:  Anicteric, conjunctiva pink, lids and lashes normal. Pupils small, equal.  No nasal deformity, discharge, epistaxis.  Lips moist, edentulous.   OP tacky dry, no oral lesions.  Hearing normal Skin: Warm and dry.  No suspicious rashes or lesions. Cardiac: RRR, no murmurs appreciated.  JVP normal.  Trace LE edema.    Respiratory: Normal respiratory rate and rhythm.  Diminshed  bialterally.  No wheezing.  Diminished at both bases most. Abdomen: Abdomen soft.  No TTP or guarding. No ascites, distension, hepatosplenomegaly.   MSK: No deformities or effusions of the large joints of the upper or lower extremities bilaterally. Neuro: Awake but somnolent. Naming is grossly intact, and the patient's recall, recent and remote, as well as general fund of knowledge seem within normal limits.  Muscle tone dimimised, without fasciculations.  Moves both arms at basleline stregnth (weak on left).  Cranial nerves all normal.  Speech fluent.    Psych: Sensorium intact and responding to questions, oriented to self, place, situation, attention normal. Affect blunted.  Judgment and insight appear normal.         Data Reviewed: I have personally reviewed following labs and imaging studies:  CBC: Recent Labs  Lab 09/10/19 1703 09/11/19 0444 09/12/19 0409  WBC 21.9* 17.3* 17.0*  NEUTROABS 18.2*  --   --   HGB 20.6* 20.0* 16.9  HCT 59.1* 58.7* 48.4  MCV 91.2 92.7 91.5  PLT 171 162 [id number]*   Basic Metabolic Panel: Recent Labs  Lab 09/10/19 1703 09/11/19 0444 09/12/19 0409  NA 136 137 135  K 3.6 4.0 3.5  CL 99 100 98  CO2 25 25 26   GLUCOSE 118* 120* 115*  BUN 12 16 21   CREATININE 0.81 0.76 0.70  CALCIUM 9.3 8.9 8.1*   GFR: Estimated Creatinine Clearance: 83.7 mL/min (by C-G formula based on SCr of 0.7 mg/dL). Liver Function Tests: Recent Labs  Lab 09/10/19 1703  AST 32  ALT 19  ALKPHOS 138*  BILITOT 1.5*  PROT 7.6  ALBUMIN 4.3   No results for input(s): LIPASE, AMYLASE in the last 168 hours. No results for input(s): AMMONIA in the last 168 hours. Coagulation Profile: Recent Labs  Lab 09/10/19 1703  INR 1.1   Cardiac Enzymes: No results for input(s): CKTOTAL, CKMB, CKMBINDEX, TROPONINI in the last 168 hours. BNP (last 3 results) No results for input(s): PROBNP in the last 8760 hours. HbA1C: No results for input(s): HGBA1C in the last 72  hours. CBG: No results for input(s): GLUCAP in the last 168 hours. Lipid Profile: No results for input(s): CHOL, HDL, LDLCALC, TRIG, CHOLHDL, LDLDIRECT in the last 72 hours. Thyroid Function Tests: No results for input(s): TSH, T4TOTAL, FREET4, T3FREE, THYROIDAB in the last 72 hours. Anemia Panel: No results for input(s): VITAMINB12, FOLATE, FERRITIN, TIBC, IRON, RETICCTPCT in the last 72 hours. Urine analysis:    Component Value Date/Time   COLORURINE Yellow 07/14/2012 2232   APPEARANCEUR Hazy 07/14/2012 2232   LABSPEC 1.024 07/14/2012 2232   PHURINE 6.0 07/14/2012 2232   GLUCOSEU Negative 07/14/2012 2232   HGBUR Negative 07/14/2012 2232   BILIRUBINUR neg 02/10/2019 0847   BILIRUBINUR Negative 07/14/2012 2232   KETONESUR 1+ 07/14/2012 2232   PROTEINUR Negative 02/10/2019 0847   PROTEINUR Negative 07/14/2012 2232   UROBILINOGEN 0.2 02/10/2019 0847   NITRITE neg 02/10/2019 0847   NITRITE Negative 07/14/2012 2232   LEUKOCYTESUR Negative 02/10/2019 0847   LEUKOCYTESUR Negative 07/14/2012 2232   Sepsis Labs: @LABRCNTIP (procalcitonin:4,lacticacidven:4)  ) Recent Results (from the past 240  hour(s))  SARS CORONAVIRUS 2 (TAT 6-24 HRS) Nasopharyngeal Nasopharyngeal Swab     Status: None   Collection Time: 09/10/19  5:13 PM   Specimen: Nasopharyngeal Swab  Result Value Ref Range Status   SARS Coronavirus 2 NEGATIVE NEGATIVE Final    Comment: (NOTE) SARS-CoV-2 target nucleic acids are NOT DETECTED. The SARS-CoV-2 RNA is generally detectable in upper and lower respiratory specimens during the acute phase of infection. Negative results do not preclude SARS-CoV-2 infection, do not rule out co-infections with other pathogens, and should not be used as the sole basis for treatment or other patient management decisions. Negative results must be combined with clinical observations, patient history, and epidemiological information. The expected result is Negative. Fact Sheet for  Patients: SugarRoll.be Fact Sheet for Healthcare Providers: https://www.woods-mathews.com/ This test is not yet approved or cleared by the [place] FDA and  has been authorized for detection and/or diagnosis of SARS-CoV-2 by FDA under an Emergency Use Authorization (EUA). This EUA will remain  in effect (meaning this test can be used) for the duration of the COVID-19 declaration under Section 56 4(b)(1) of the Act, 21 U.S.C. section 360bbb-3(b)(1), unless the authorization is terminated or revoked sooner. Performed at [facility], [place] [address], [place], [place] [address]          Radiology Studies: Ct Head Wo Contrast  Result Date: 09/10/2019 CLINICAL DATA:  Mechanical fall, no reported head strike EXAM: CT HEAD WITHOUT CONTRAST CT CERVICAL SPINE WITHOUT CONTRAST TECHNIQUE: Multidetector CT imaging of the head and cervical spine was performed following the standard protocol without intravenous contrast. Multiplanar CT image reconstructions of the cervical spine were also generated. COMPARISON:  None. FINDINGS: CT HEAD FINDINGS Brain: Regions of gliosis in the bilateral basal ganglia and left cerebellar hemisphere likely reflects sequela of prior infarct. No evidence of acute infarction, hemorrhage, hydrocephalus, extra-axial collection or mass lesion/mass effect. Symmetric prominence of the ventricles, cisterns and sulci compatible with parenchymal volume loss. Patchy areas of white matter hypoattenuation are most compatible with chronic microvascular angiopathy. Vascular: Atherosclerotic calcification of the carotid siphons and intradural vertebral arteries. No hyperdense vessel. Skull: No calvarial fracture or suspicious osseous lesion. No scalp swelling or hematoma. Sinuses/Orbits: Paranasal sinuses and mastoid air cells are predominantly clear. Included orbital structures are unremarkable. Other: None CT CERVICAL SPINE FINDINGS  Alignment: Preservation of the normal cervical lordosis without traumatic listhesis. No abnormal facet widening. Normal alignment of the craniocervical and atlantoaxial articulations. Degenerative 2 mm of anterolisthesis C5 on C6. No traumatic listhesis. Posterior elements are normally aligned. Craniocervical and atlantoaxial alignment is maintained accounting for slight rotation of the patient's head. Skull base and vertebrae: The osseous structures appear diffusely demineralized which may limit detection of small or nondisplaced fractures. No acute fracture. No primary bone lesion or focal pathologic process. Soft tissues and spinal canal: No pre or paravertebral fluid or swelling. No visible canal hematoma. Disc levels: Multilevel intervertebral disc height loss with spondylitic endplate changes. Multilevel disc osteophyte complexes result in at most mild canal stenosis most pronounced at C3-4 secondary to a combination of disc osteophyte complex and posterior ligamentum flavum infolding. Diffuse uncinate spurring and facet hypertrophic changes result in mild-to-moderate multilevel neural foraminal narrowing. No severe neural foraminal stenosis is evident. Upper chest: Some abundant subpleural fat noted in the apices with mild apical pleuroparenchymal scarring and features of emphysema. Other: Normal thyroid. Cervical carotid atherosclerosis and calcification of the proximal right subclavian artery. IMPRESSION: 1. No acute intracranial abnormality. No scalp  swelling or calvarial fracture. 2. Old infarcts in the bilateral basal ganglia and left cerebellar hemisphere. 3. Moderate parenchymal volume loss and chronic microvascular angiopathy. 4. No acute cervical spine fracture. Diffuse osseous demineralization may limit detection of small or nondisplaced fractures. 5. Cervical spondylitic and facet degenerative changes, as above. Electronically Signed   By: Lovena Le M.D.   On: 09/10/2019 16:49   Ct Cervical  Spine Wo Contrast  Result Date: 09/10/2019 CLINICAL DATA:  Mechanical fall, no reported head strike EXAM: CT HEAD WITHOUT CONTRAST CT CERVICAL SPINE WITHOUT CONTRAST TECHNIQUE: Multidetector CT imaging of the head and cervical spine was performed following the standard protocol without intravenous contrast. Multiplanar CT image reconstructions of the cervical spine were also generated. COMPARISON:  None. FINDINGS: CT HEAD FINDINGS Brain: Regions of gliosis in the bilateral basal ganglia and left cerebellar hemisphere likely reflects sequela of prior infarct. No evidence of acute infarction, hemorrhage, hydrocephalus, extra-axial collection or mass lesion/mass effect. Symmetric prominence of the ventricles, cisterns and sulci compatible with parenchymal volume loss. Patchy areas of white matter hypoattenuation are most compatible with chronic microvascular angiopathy. Vascular: Atherosclerotic calcification of the carotid siphons and intradural vertebral arteries. No hyperdense vessel. Skull: No calvarial fracture or suspicious osseous lesion. No scalp swelling or hematoma. Sinuses/Orbits: Paranasal sinuses and mastoid air cells are predominantly clear. Included orbital structures are unremarkable. Other: None CT CERVICAL SPINE FINDINGS Alignment: Preservation of the normal cervical lordosis without traumatic listhesis. No abnormal facet widening. Normal alignment of the craniocervical and atlantoaxial articulations. Degenerative 2 mm of anterolisthesis C5 on C6. No traumatic listhesis. Posterior elements are normally aligned. Craniocervical and atlantoaxial alignment is maintained accounting for slight rotation of the patient's head. Skull base and vertebrae: The osseous structures appear diffusely demineralized which may limit detection of small or nondisplaced fractures. No acute fracture. No primary bone lesion or focal pathologic process. Soft tissues and spinal canal: No pre or paravertebral fluid or  swelling. No visible canal hematoma. Disc levels: Multilevel intervertebral disc height loss with spondylitic endplate changes. Multilevel disc osteophyte complexes result in at most mild canal stenosis most pronounced at C3-4 secondary to a combination of disc osteophyte complex and posterior ligamentum flavum infolding. Diffuse uncinate spurring and facet hypertrophic changes result in mild-to-moderate multilevel neural foraminal narrowing. No severe neural foraminal stenosis is evident. Upper chest: Some abundant subpleural fat noted in the apices with mild apical pleuroparenchymal scarring and features of emphysema. Other: Normal thyroid. Cervical carotid atherosclerosis and calcification of the proximal right subclavian artery. IMPRESSION: 1. No acute intracranial abnormality. No scalp swelling or calvarial fracture. 2. Old infarcts in the bilateral basal ganglia and left cerebellar hemisphere. 3. Moderate parenchymal volume loss and chronic microvascular angiopathy. 4. No acute cervical spine fracture. Diffuse osseous demineralization may limit detection of small or nondisplaced fractures. 5. Cervical spondylitic and facet degenerative changes, as above. Electronically Signed   By: Lovena Le M.D.   On: 09/10/2019 16:49   Dg Pelvis Portable  Result Date: 09/11/2019 CLINICAL DATA:  Postop right hip hemiarthroplasty EXAM: PORTABLE PELVIS 1-2 VIEWS COMPARISON:  09/10/2019 FINDINGS: Right hip hemiarthroplasty in satisfactory position and alignment. No fracture or complication. Left iliac artery stent. IMPRESSION: Satisfactory right hip hemiarthroplasty. Electronically Signed   By: Franchot Gallo M.D.   On: 09/11/2019 19:08   Ct Hip Right Wo Contrast  Result Date: 09/10/2019 CLINICAL DATA:  Right hip pain secondary to a fall at home. EXAM: CT OF THE RIGHT HIP WITHOUT CONTRAST TECHNIQUE: Multidetector CT imaging of  the right hip was performed according to the standard protocol. Multiplanar CT image  reconstructions were also generated. COMPARISON:  CT scan of the abdomen and pelvis dated 07/15/2012 FINDINGS: Bones/Joint/Cartilage There is an impacted fracture of the proximal right femoral neck. No dislocation. The visualized pelvic bones are intact. No significant arthritic changes of right hip joint. Muscles and Tendons Normal. Soft tissues No acute abnormality. Fem-fem bypass graft. IMPRESSION: Impacted fracture of the proximal right femoral neck. Electronically Signed   By: Lorriane Shire M.D.   On: 09/10/2019 16:47   Dg Chest Port 1 View  Result Date: 09/11/2019 CLINICAL DATA:  Airway aspiration EXAM: PORTABLE CHEST 1 VIEW COMPARISON:  09/10/2019 FINDINGS: New area of left lower lobe airspace disease, probable pneumonia given history. Right lung clear. Negative for heart failure or effusion. Apical scarring bilaterally. IMPRESSION: New left lower lobe airspace disease consistent with aspiration pneumonia. Electronically Signed   By: Franchot Gallo M.D.   On: 09/11/2019 19:16   Dg Chest Portable 1 View  Result Date: 09/10/2019 CLINICAL DATA:  Fall with right hip pain EXAM: PORTABLE CHEST 1 VIEW COMPARISON:  07/14/2012 FINDINGS: Mild reticular opacity at the periphery and lung bases, suspicious for chronic interstitial lung disease. No consolidation or effusion. Normal heart size. Aortic atherosclerosis. No pneumothorax. IMPRESSION: Mild peripheral and basilar reticular opacities, suspicious for chronic interstitial lung disease. No acute airspace disease is seen. Electronically Signed   By: Donavan Foil M.D.   On: 09/10/2019 16:49   Dg Hip Port Unilat With Pelvis 1v Right  Result Date: 09/11/2019 CLINICAL DATA:  Right femoral neck fracture. EXAM: DG HIP (WITH OR WITHOUT PELVIS) 1V PORT RIGHT COMPARISON:  Radiograph dated 09/10/2019 FINDINGS: The right femoral head and neck have been resected. Hip arthroplasty hardware with radiolucent femoral head component in place. Sponge overlies the hip  joint. IMPRESSION: Hardware in place as described. Electronically Signed   By: Lorriane Shire M.D.   On: 09/11/2019 16:08   Dg Hip Unilat With Pelvis 2-3 Views Right  Result Date: 09/10/2019 CLINICAL DATA:  Right hip pain after a fall EXAM: DG HIP (WITH OR WITHOUT PELVIS) 2-3V RIGHT COMPARISON:  CT of the right hip from the same day. FINDINGS: There is an impacted subcapital right femoral neck fracture which is not significantly changed since prior CT. There is no dislocation of either hip. Degenerative changes are seen in the lumbar spine. A vascular stent overlies the left iliac artery. IMPRESSION: 1. Impacted subcapital right femoral neck fracture. No dislocation. 2. Electronically Signed   By: Zerita Boers M.D.   On: 09/10/2019 19:27        Scheduled Meds:  acetaminophen  1,000 mg Oral Q8H   amLODipine  10 mg Oral Daily   aspirin EC  81 mg Oral Daily   atorvastatin  20 mg Oral QHS   Chlorhexidine Gluconate Cloth  6 each Topical Daily   cloNIDine  0.1 mg Oral BID   docusate sodium  100 mg Oral BID   enoxaparin (LOVENOX) injection  40 mg Subcutaneous [id number]   folic acid  1 mg Oral Daily   influenza vaccine adjuvanted  0.5 mL Intramuscular Tomorrow-1000   metoprolol tartrate  25 mg Oral BID   pantoprazole  40 mg Oral Daily   pneumococcal 23 valent vaccine  0.5 mL Intramuscular Tomorrow-1000   quinapril  40 mg Oral Daily   cyanocobalamin  1,000 mcg Oral Daily   Continuous Infusions:    LOS: 2 days    Time  spent: 35 minutes    Edwin Dada, MD [facility] 09/12/2019, 8:30 AM     Please page though [place] or Epic secure chat:  For [employer], Adult nurse

## 2019-09-12 NOTE — Significant Event (Signed)
Rapid Response Event Note  Overview: Time Called: 0820 Arrival Time: 0823 Event Type: Respiratory  Initial Focused Assessment: called for RR on gentleman with breathing difficulty, upon arrival learned that pt had aspirated during intubation for hip surgery. Currently on NRB and 02 stas 92%.   Interventions: Dr Loleta Books in room assessing patient, ordered ABG. ABG ok, so will try HFNC.  Plan of Care (if not transferred):RN Estill Bamberg to call if further assistance necessary.  Event Summary: Name of Physician Notified: Danford at [phone]    at    Outcome: Stayed in room and stabalized  Event End Time: [place]

## 2019-09-12 NOTE — Progress Notes (Signed)
Pt continues to De-Sat. Able to get O2 up to low 90's but gradually de-sats to low 80's. Pt alert and oriented, states he feels "fine". Pt on continuous monitoring. NP Ouma notified. pdowless,rn

## 2019-09-12 NOTE — Anesthesia Postprocedure Evaluation (Addendum)
Anesthesia Post Note  Patient: Alexander Alvarado  Procedure(s) Performed: ARTHROPLASTY BIPOLAR HIP (HEMIARTHROPLASTY) (Right Hip)  Patient location during evaluation: Nursing Unit Anesthesia Type: Spinal Level of consciousness: oriented and awake and alert Pain management: pain level controlled Vital Signs Assessment: vitals unstable Respiratory status: spontaneous breathing and patient connected to face mask oxygen Cardiovascular status: blood pressure returned to baseline and stable Postop Assessment: no headache, no backache, no apparent nausea or vomiting and patient able to bend at knees Anesthetic complications: no Comments: Patient being transferred to higher level of care for increased 02 requirement.  Currently being assessed by medicine team     Last Vitals:  Vitals:   09/12/19 0819 09/12/19 0823  BP:    Pulse:    Resp:    Temp:    SpO2: (!) 86% 91%    Last Pain:  Vitals:   09/12/19 0738  TempSrc:   PainSc: Asleep                 Precious Haws Gearlene Godsil

## 2019-09-12 NOTE — Discharge Instructions (Signed)

## 2019-09-12 NOTE — Progress Notes (Signed)
Physical Therapy Treatment Patient Details Name: Alexander Alvarado MRN: [id number] DOB: 1941/04/18 Today's Date: 09/12/2019    History of Present Illness KENDALE PLUCINSKI is a 78 y.o. male with medical history significant of CVA with residual left-sided weakness, peripheral vascular disease s/p left foot toe amputation, hypertension hyperlipidemia who presents status post mechanical fall. Now S/P right hip hemiarthroplasty    PT Comments    Patient agrees to PT treatment. He is limited by weakness in RLE and LLE and from pain in RLE hip. He needs max assist for supine <> sit mobility and max assist for sitting at edge of bed due to posterior and left lateral lean and R hip pain. He is not able to attempt transfer sit to stand as his sitting tolerance and balance are preventing this today. He is able to participate in BLE exercises with AAROM RLE and LLE. He will continue to benefit from skilled PT to improve mobility and strength.   Follow Up Recommendations  SNF     Equipment Recommendations  Rolling walker with 5" wheels    Recommendations for Other Services       Precautions / Restrictions Precautions Precautions: Posterior Hip Restrictions RLE Weight Bearing: Weight bearing as tolerated    Mobility  Bed Mobility Overal bed mobility: Needs Assistance Bed Mobility: Rolling;Supine to Sit;Sit to Supine Rolling: Max assist   Supine to sit: Max assist Sit to supine: Max assist   General bed mobility comments: needs total assist  Transfers Overall transfer level: (unable due to poor sitting balance )                  Ambulation/Gait                 Stairs             Wheelchair Mobility    Modified Rankin (Stroke Patients Only)       Balance Overall balance assessment: Needs assistance Sitting-balance support: Bilateral upper extremity supported Sitting balance-[name]: Poor   Postural control: Posterior lean;Left lateral lean                                   Cognition Arousal/Alertness: Awake/alert Behavior During Therapy: WFL for tasks assessed/performed Overall Cognitive Status: Within Functional Limits for tasks assessed                                        Exercises      General Comments        Pertinent Vitals/Pain Pain Assessment: 0-10 Pain Score: 5  Pain Location: right hip Pain Descriptors / Indicators: Aching Pain Intervention(s): Limited activity within patient's tolerance;Monitored during session    Home Living                      Prior Function            PT Goals (current goals can now be found in the care plan section) Acute Rehab PT Goals Patient Stated Goal: To get stronger and have less pain PT Goal Formulation: With patient Progress towards PT goals: Progressing toward goals    Frequency    BID      PT Plan Current plan remains appropriate    Co-evaluation  AM-PAC PT "6 Clicks" Mobility   Outcome Measure  Help needed turning from your back to your side while in a flat bed without using bedrails?: Total Help needed moving from lying on your back to sitting on the side of a flat bed without using bedrails?: Total Help needed moving to and from a bed to a chair (including a wheelchair)?: Total Help needed standing up from a chair using your arms (e.g., wheelchair or bedside chair)?: Total Help needed to walk in hospital room?: Total Help needed climbing 3-5 steps with a railing? : Total 6 Click Score: 6    End of Session Equipment Utilized During Treatment: Gait belt;Oxygen Activity Tolerance: Patient limited by pain Patient left: in bed;with call bell/phone within reach Nurse Communication: Mobility status PT Visit Diagnosis: Unsteadiness on feet (R26.81);Muscle weakness (generalized) (M62.81);Difficulty in walking, not elsewhere classified (R26.2);Pain Pain - Right/Left: Right Pain - part of body: Hip      Time: [id number] PT Time Calculation (min) (ACUTE ONLY): 25 min  Charges:  $Therapeutic Activity: 23-37 mins                      Alanson Puls, PT DPT 09/12/2019, 3:58 PM

## 2019-09-12 NOTE — Progress Notes (Signed)
NP Ouman came up to see pt. Continous O2 monitoring d/c'd. Pt alert and oriented. Oxygen decreased from 4 ltrs to 3 ltrs at this time. NP advised to give additional Oxy 5 for pain control. Pdowless, rn

## 2019-09-12 NOTE — Progress Notes (Signed)
Physical Therapy Evaluation Patient Details Name: Alexander Alvarado MRN: [id number] DOB: 05-03-1941 Today's Date: 09/12/2019   History of Present Illness  HURL RO is a 78 y.o. male with medical history significant of CVA with residual left-sided weakness, peripheral vascular disease s/p left foot toe amputation, hypertension hyperlipidemia who presents status post mechanical fall. Now S/P right hip hemiarthroplasty  Clinical Impression  Patient presents with profound weakness BLE hip and knee, and needs max assist for supine<>sit bed mobility. He has 2/5 strength RLE and 1/5 LLE hip flex and abd. He has weakness from previous CVA on LLE. He does not attempt to perform sit to stand transfer with RW with encouragement from PT. He sits at the edge of bed for 5 minutes with poor sitting balance, posterior and left lateral leaning with VC for correction. He reports high pain during mobility training. He will continue to need skilled PT for progression of mobility and strength.     Follow Up Recommendations SNF    Equipment Recommendations  None recommended by PT    Recommendations for Other Services       Precautions / Restrictions Precautions Precautions: Posterior Hip Restrictions Weight Bearing Restrictions: Yes Other Position/Activity Restrictions: (WBAT RLE)      Mobility  Bed Mobility                  Transfers Overall transfer level: (unable to attempt sit to stand , unwilling to try)                  Ambulation/Gait Ambulation/Gait assistance: (unable)              Stairs            Wheelchair Mobility    Modified Rankin (Stroke Patients Only)       Balance Overall balance assessment: Needs assistance Sitting-balance support: Bilateral upper extremity supported Sitting balance-[name]: Fair Sitting balance - Comments: (lateral left lean and posterior lean) Postural control: Posterior lean;Left lateral lean                                    Pertinent Vitals/Pain Pain Assessment: 0-10 Pain Score: 7  Pain Location: (RLE hip) Pain Descriptors / Indicators: Aching Pain Intervention(s): Limited activity within patient's tolerance;Monitored during session    Home Living Family/patient expects to be discharged to:: Unsure Living Arrangements: Alone                    Prior Function Level of Independence: Independent with assistive device(s)               Hand Dominance        Extremity/Trunk Assessment   Upper Extremity Assessment Upper Extremity Assessment: Defer to OT evaluation    Lower Extremity Assessment Lower Extremity Assessment: RLE deficits/detail;LLE deficits/detail RLE Deficits / Details: (R ;hip 2/5 flex/abd, right knee 3/5 flex/ext) LLE Deficits / Details: (LLE 1/5 hip abd, flex,knee 1/5 flex/ext)       Communication   Communication: No difficulties  Cognition Arousal/Alertness: Awake/alert Behavior During Therapy: WFL for tasks assessed/performed Overall Cognitive Status: Within Functional Limits for tasks assessed                                        General Comments      Exercises  Assessment/Plan    PT Assessment Patient needs continued PT services  PT Problem List Decreased strength;Decreased activity tolerance;Decreased balance;Decreased mobility;Decreased knowledge of use of DME;Decreased safety awareness;Decreased knowledge of precautions;Cardiopulmonary status limiting activity       PT Treatment Interventions Gait training;Functional mobility training;Therapeutic activities;Therapeutic exercise;Balance training    PT Goals (Current goals can be found in the Care Plan section)  Acute Rehab PT Goals Patient Stated Goal: (to get better) PT Goal Formulation: With patient Time For Goal Achievement: 09/26/19 Potential to Achieve Goals: Good    Frequency BID   Barriers to discharge Decreased caregiver support       Co-evaluation               AM-PAC PT "6 Clicks" Mobility  Outcome Measure Help needed turning from your back to your side while in a flat bed without using bedrails?: Total Help needed moving from lying on your back to sitting on the side of a flat bed without using bedrails?: Total Help needed moving to and from a bed to a chair (including a wheelchair)?: Total Help needed standing up from a chair using your arms (e.g., wheelchair or bedside chair)?: Total Help needed to walk in hospital room?: Total Help needed climbing 3-5 steps with a railing? : Total 6 Click Score: 6    End of Session Equipment Utilized During Treatment: Gait belt;Oxygen Activity Tolerance: Patient limited by lethargy Patient left: in bed;with call bell/phone within reach;with nursing/sitter in room Nurse Communication: Mobility status PT Visit Diagnosis: Unsteadiness on feet (R26.81);Muscle weakness (generalized) (M62.81);Difficulty in walking, not elsewhere classified (R26.2)    Time: [id number] PT Time Calculation (min) (ACUTE ONLY): 30 min   Charges:   PT Evaluation $PT Eval Moderate Complexity: 1 Mod PT Treatments $Therapeutic Activity: 8-22 mins          Alanson Puls, PT DPT 09/12/2019, 9:31 AM

## 2019-09-12 NOTE — Evaluation (Signed)
Occupational Therapy Evaluation Patient Details Name: Alexander Alvarado MRN: [id number] DOB: 01/21/1941 Today's Date: 09/12/2019    History of Present Illness Alexander Alvarado is a 78 y.o. male with medical history significant of CVA with residual left-sided weakness, peripheral vascular disease s/p left foot toe amputation, hypertension hyperlipidemia who presents status post mechanical fall. Now S/P right hip hemiarthroplasty   Clinical Impression   Alexander Alvarado was seen for OT evaluation this date, POD#1 from above surgery. Pt was modified independent in all ADLs prior to surgery, however he reports requiring use of a 4WW for all household mobility 2/2 his history of L sided weakness from prior CVA. Pt is eager to return to PLOF with less pain and improved safety and independence, however, remains pain limited throughout OT evaluation and declines to participate in any mobility this date. Pt currently requires total assistance for toileting and peri-care at bed level and will likely require +2 max assistance for bed and functional mobility. Pt unable to recall any of his posterior total hip precautions at start of session and unable to verbalize how to implement during ADL and mobility. Pt instructed in posterior total hip precautions and how to implement, self care skills, & falls prevention strategies. Pt would benefit from additional instruction in self care skills and techniques to help maintain precautions with or without assistive devices to support recall and carryover prior to discharge. Recommend STR upon discharge.       Follow Up Recommendations  SNF    Equipment Recommendations  3 in 1 bedside commode    Recommendations for Other Services       Precautions / Restrictions Precautions Precautions: Posterior Hip Precaution Booklet Issued: Yes (comment) Restrictions Weight Bearing Restrictions: Yes RLE Weight Bearing: Weight bearing as tolerated Other Position/Activity  Restrictions: (WBAT RLE)      Mobility Bed Mobility Overal bed mobility: Needs Assistance Bed Mobility: Rolling Rolling: Max assist         General bed mobility comments: Deferred. Pt declines. Pt requires assist for bed mobility from nursing staff since admission.  Transfers Overall transfer level: (unable to attempt sit to stand , unwilling to try)                    Balance Overall balance assessment: Needs assistance Sitting-balance support: Bilateral upper extremity supported Sitting balance-[name]: Fair Sitting balance - Comments: Deferred. Unable to assess. Pt declines mobility this date. Postural control: Posterior lean;Left lateral lean                                 ADL either performed or assessed with clinical judgement   ADL                                         General ADL Comments: Pt declines to participate in ADLs this am 2/2 increased pain and discomfort in his hip. Currently susppect pt heavy +2 assist for functional mobility in consideration of R hip fx as well as hx of CVA with L sided weakness. Currently max assist for toileting/peri-care at bed level. Set-up assist for meal trays and grooming tasks.     Vision Baseline Vision/History: Wears glasses Wears Glasses: At all times Patient Visual Report: No change from baseline       Perception     Praxis  Pertinent Vitals/Pain Pain Assessment: 0-10 Pain Score: 10-Worst pain ever Pain Location: R Hip/leg Pain Descriptors / Indicators: Aching;Sore;Constant Pain Intervention(s): Limited activity within patient's tolerance;Monitored during session;Premedicated before session     Hand Dominance Right   Extremity/Trunk Assessment Upper Extremity Assessment Upper Extremity Assessment: Generalized weakness;LUE deficits/detail LUE Deficits / Details: Pt with hx of L sided weakness from CVA. Grossly 3/5 t/o this date. Alexander Alvarado WFL with  finger to thumb  opposition slowed, but pt able to perform. LUE Coordination: decreased gross motor   Lower Extremity Assessment Lower Extremity Assessment: Defer to PT evaluation;Generalized weakness;RLE deficits/detail RLE Deficits / Details: s/p R hip hemiarth. with pt under Posterior THPs RLE: Unable to fully assess due to pain;Unable to fully assess due to immobilization LLE Deficits / Details: (LLE 1/5 hip abd, flex,knee 1/5 flex/ext)       Communication Communication Communication: No difficulties   Cognition Arousal/Alertness: Awake/alert Behavior During Therapy: WFL for tasks assessed/performed Overall Cognitive Status: Within Functional Limits for tasks assessed                                     General Comments  Pt on 10 L HFNC t/o session. SpO2 monitored during sesison and noted to range between 92-94% with pt at rest. Will continue to monitor with further mobility attempts.    Exercises Other Exercises Other Exercises: Pt educated in falls prevention strategies, safe use of AE for ADL management, and posterior total hip precautions with handout provided. Pt would benefit from reinforcement.   Shoulder Instructions      Home Living Family/patient expects to be discharged to:: Unsure Living Arrangements: Alone                               Additional Comments: Pt was living at an assisted living facility. Has 2 adult children, 1 lives in [place] the other in [place]. Pt states he is unsure if he will go back to ALF or if he and his children will make other arrangements.      Prior Functioning/Environment Level of Independence: Independent with assistive device(s)        Comments: Pt uses 4WW for all mobility. States "I can't take a step without my walker". Friends assist with shopping and bring him food. States he rarely leaves the house.        OT Problem List: Decreased strength;Decreased coordination;Decreased activity tolerance;Decreased safety  awareness;Impaired balance (sitting and/or standing);Decreased knowledge of use of DME or AE;Impaired UE functional use;Cardiopulmonary status limiting activity;Decreased range of motion      OT Treatment/Interventions: Self-care/ADL training;Balance training;Therapeutic exercise;Therapeutic activities;Energy conservation;DME and/or AE instruction;Patient/family education    OT Goals(Current goals can be found in the care plan section) Acute Rehab OT Goals Patient Stated Goal: To get stronger and have less pain OT Goal Formulation: With patient Time For Goal Achievement: 09/26/19 Potential to Achieve Goals: Good ADL Goals Pt Will Perform Lower Body Bathing: sitting/lateral leans;with min assist;with adaptive equipment(With LRAD PRN for improved safety, adherence to hip precautions, and functional independence.) Pt Will Perform Lower Body Dressing: with min assist;sit to/from stand;with adaptive equipment(With LRAD PRN for improved safety, adherence to hip precautions, and functional independence.) Pt Will Transfer to Toilet: ambulating;bedside commode;with mod assist;with min assist(With LRAD PRN for improved safety, adherence to hip precautions, and functional independence.) Additional ADL Goal #1: Pt will independently verbalize  a plan to implement at least 3 learned balls prevention strategies for improved safety and functional independence upon Alvarado DC.  OT Frequency: Min 2X/week   Barriers to D/C:            Co-evaluation              AM-PAC OT "6 Clicks" Daily Activity     Outcome Measure Help from another person eating meals?: A Little Help from another person taking care of personal grooming?: A Little Help from another person toileting, which includes using toliet, bedpan, or urinal?: Total Help from another person bathing (including washing, rinsing, drying)?: Total Help from another person to put on and taking off regular upper body clothing?: A Lot Help from  another person to put on and taking off regular lower body clothing?: A Lot 6 Click Score: 12   End of Session    Activity Tolerance: Patient limited by pain Patient left: in bed;with call bell/phone within reach;with bed alarm set;with nursing/sitter in room  OT Visit Diagnosis: Other abnormalities of gait and mobility (R26.89);History of falling (Z91.81);Pain Pain - Right/Left: Right Pain - part of body: Hip;Leg                Time: [id number] OT Time Calculation (min): 18 min Charges:  OT General Charges $OT Visit: 1 Visit OT Evaluation $OT Eval Moderate Complexity: 1 Mod OT Treatments $Self Care/Home Management : 8-22 mins  Shara Blazing, M.S., OTR/L Ascom: [phone] 09/12/19, 12:08 PM

## 2019-09-13 ENCOUNTER — Encounter: Payer: Self-pay | Admitting: Orthopedic Surgery

## 2019-09-13 ENCOUNTER — Inpatient Hospital Stay: Payer: Medicare HMO

## 2019-09-13 LAB — CBC
HCT: 47.7 % (ref 39.0–52.0)
Hemoglobin: 16.2 g/dL (ref 13.0–17.0)
MCH: 32.1 pg (ref 26.0–34.0)
MCHC: 34 g/dL (ref 30.0–36.0)
MCV: 94.5 fL (ref 80.0–100.0)
Platelets: 118 10*3/uL — ABNORMAL LOW (ref 150–400)
RBC: 5.05 MIL/uL (ref 4.22–5.81)
RDW: 13 % (ref 11.5–15.5)
WBC: 12.9 10*3/uL — ABNORMAL HIGH (ref 4.0–10.5)
nRBC: 0 % (ref 0.0–0.2)

## 2019-09-13 LAB — BASIC METABOLIC PANEL
Anion gap: 11 (ref 5–15)
BUN: 24 mg/dL — ABNORMAL HIGH (ref 8–23)
CO2: 27 mmol/L (ref 22–32)
Calcium: 8.5 mg/dL — ABNORMAL LOW (ref 8.9–10.3)
Chloride: 97 mmol/L — ABNORMAL LOW (ref 98–111)
Creatinine, Ser: 0.69 mg/dL (ref 0.61–1.24)
GFR calc Af Amer: >60 mL/min (ref >60)
GFR calc non Af Amer: >60 mL/min (ref >60)
Glucose, Bld: 87 mg/dL (ref 70–99)
Potassium: 3.7 mmol/L (ref 3.5–5.1)
Sodium: 135 mmol/L (ref 135–145)

## 2019-09-13 MED ORDER — ENOXAPARIN SODIUM 40 MG/0.4ML ~~LOC~~ SOLN: 40.0000 mg | SUBCUTANEOUS | 0 refills | Status: DC

## 2019-09-13 MED ORDER — ACETAMINOPHEN 500 MG PO TABS
1000.0000 mg | ORAL_TABLET | Freq: Three times a day (TID) | ORAL | Status: DC
  Administered 2019-09-13 – 2019-09-16 (×8): 1000 mg via ORAL
  Filled 2019-09-13 (×8): qty 2

## 2019-09-13 MED ORDER — IOHEXOL 350 MG/ML SOLN
75.0000 mL | Freq: Once | INTRAVENOUS | Status: AC | PRN
  Administered 2019-09-13: 75 mL via INTRAVENOUS

## 2019-09-13 MED ORDER — ALPRAZOLAM 0.5 MG PO TABS
0.5000 mg | ORAL_TABLET | Freq: Once | ORAL | Status: AC
  Administered 2019-09-13: 0.5 mg via ORAL
  Filled 2019-09-13: qty 1

## 2019-09-13 MED ORDER — TRAMADOL HCL 50 MG PO TABS: 50.0000 mg | ORAL_TABLET | Freq: Four times a day (QID) | ORAL | 0 refills | Status: DC | PRN

## 2019-09-13 MED ORDER — OXYCODONE HCL 5 MG PO TABS: 5.0000 mg | ORAL_TABLET | ORAL | 0 refills | Status: DC | PRN

## 2019-09-13 NOTE — Progress Notes (Signed)
PROGRESS NOTE    Alexander Alvarado  [id number] DOB: February 05, 1941 DOA: 09/10/2019 PCP: Glean Hess, MD      Brief Narrative:  Alexander Alvarado is a 78 y.o. M with hx CVA residual left hemiparesis, chronic polycythemia, HTN, smoking, PVD s/p left partial foot amputation and ALF dwelling who presented with mechanical fall and left hip pain.  Patient was bending over when he lost his balance and fell.  No chest pain or loss of consciousness at the time of the fall.  In the ER, x-ray showed right femoral neck fracture.  Orthopedics were consulted and recommended operative repair.         Assessment & Plan:  Acute right femoral neck fracture S/p RIGHT hemiarthroplasty 11/20 by Dr. Posey Pronto Intraoperative course complicated by aspiration into chest.  Anesthesiology told nursing who told my partner overnight that this might have been "coffee ground" aspirate.  Creatinine stable this morning. Hemoglobin down from surgery, but stable from yesterday. -Consult orthopedics, appreciate cares -PT eval   Acute hypoxic respiratory failure due to aspiration pneumonia Patient with increasing O2 needs overnight 11/21 after surgery. Chest x-ray showed new bibasilar opacities, interstitial change consistent with aspiration. Started on ceftriaxone.  Improved with ceftriaxone start as well as Lasix yesterday -Continue ceftriaxone -Minimize opiates -Continue turns, cough, incentive spirometer and flutter q2  Altered mental status Resolved   Possible hematemesis There was hearsay when the patient had a rapid response 11/21 the patient had coffee ground emesis intraop.   His hemoglobin is remained stable since this was noted. Any has had no stools or hematemesis. I have a low clinical suspicion for a significant GI bleed. -Continue home PPI -FOBT all stool  Cerebrovascular disease secondary prevention Peripheral vascular disease secondary prevention Hypertension Blood pressure mains  elevated -Continue amlodipine, clonidine, metoprolol -Continue quinapril -Continue atorvastatin, aspirin  Bradycardia Brief asymptomatic pause today. Persistent bradycardia. -Continue metoprolol and clonidine for now, monitor telemetry  Erythrocytosis This appears to be chronic polycythemia.  Likely secondary polycythemia from smoking.  This does increase his risk of perioperative stroke.  Reasonable to obtain Jak testing. -Follow EPO and JAK level         MDM and disposition: The below labs and imaging reports reviewed and summarized above.  Medication management as above.   The patient was admitted with acute right femoral neck fracture.    Intraoperative course complicated now by aspiration and hypoxic respiratory failure  He is improving from the standpoint of his hypoxic respiratory failure, and is down to 4 L of supplemental oxygen. He may have some superimposed chronic respiratory failure at baseline.     DVT prophylaxis: SCD Code Status: DO NOT RESUSCITATE Family Communication:     Consultants:   Orthopedics  Procedures:     Antimicrobials:    Ceftriaxone 11/21 >>   Subjective: Still has severe pain in his right hip. Back pain from the bed. He has no confusion, fever, vomiting, diarrhea, melena, hematemesis. Oriented to self, situation.      Objective: Vitals:   09/12/19 2256 09/13/19 0249 09/13/19 0747 09/13/19 0900  BP: (!) 143/76 (!) 146/78 140/72   Pulse: 68 63 (!) 56   Resp:  20    Temp: 98.4 F (36.9 C) 98.1 F (36.7 C) 97.6 F (36.4 C)   TempSrc: Oral Oral Oral   SpO2: 92% 92% 95% 92%  Weight:      Height:        Intake/Output Summary (Last 24 hours) at 09/13/2019 1255 Last  data filed at 09/13/2019 0900 Gross per 24 hour  Intake 460 ml  Output 1000 ml  Net -540 ml   Filed Weights   09/10/19 1517  Weight: 77.1 kg    Examination: General appearance:  adult male, alert and in no acute distress.   HEENT: Anicteric,  conjunctiva pink, lids and lashes normal. No nasal deformity, discharge, epistaxis.  Lips moist, teeth normal. OP moist, no oral lesions.   Skin: Warm and dry.  No suspicious rashes or lesions. Cardiac: RRR, no murmurs appreciated. JVP normal no LE edema.    Respiratory: Normal respiratory rate and rhythm. Diminished throughout, no wheezing. Abdomen: Abdomen soft. No TTP or guarding. No ascites, distension, hepatosplenomegaly.   MSK: No deformities or effusions of the large joints of the upper or lower extremities bilaterally. Diffuse loss of subcutaneous muscle mass and fat. Neuro: Awake and alert. Naming is grossly intact, and the patient's recall, recent and remote, as well as general fund of knowledge seem within normal limits.  Muscle tone diminished, without fasciculations.  Moves left side with slight weakness relative to right. Leg strength not tested due to fracture  . Speech fluent.    Psych: Sensorium intact and responding to questions, attention normal. Affect normal.  Judgment and insight appear normal.        Data Reviewed: I have personally reviewed following labs and imaging studies:  CBC: Recent Labs  Lab 09/10/19 1703 09/11/19 0444 09/12/19 0409 09/13/19 0554  WBC 21.9* 17.3* 17.0* 12.9*  NEUTROABS 18.2*  --   --   --   HGB 20.6* 20.0* 16.9 16.2  HCT 59.1* 58.7* 48.4 47.7  MCV 91.2 92.7 91.5 94.5  PLT 171 162 129* [id number]*   Basic Metabolic Panel: Recent Labs  Lab 09/10/19 1703 09/11/19 0444 09/12/19 0409 09/13/19 0554  NA 136 137 135 135  K 3.6 4.0 3.5 3.7  CL 99 100 98 97*  CO2 25 25 26 27   GLUCOSE 118* 120* 115* 87  BUN 12 16 21  24*  CREATININE 0.81 0.76 0.70 0.69  CALCIUM 9.3 8.9 8.1* 8.5*   GFR: Estimated Creatinine Clearance: 83.7 mL/min (by C-G formula based on SCr of 0.69 mg/dL). Liver Function Tests: Recent Labs  Lab 09/10/19 1703  AST 32  ALT 19  ALKPHOS 138*  BILITOT 1.5*  PROT 7.6  ALBUMIN 4.3   No results for input(s): LIPASE,  AMYLASE in the last 168 hours. No results for input(s): AMMONIA in the last 168 hours. Coagulation Profile: Recent Labs  Lab 09/10/19 1703  INR 1.1   Cardiac Enzymes: No results for input(s): CKTOTAL, CKMB, CKMBINDEX, TROPONINI in the last 168 hours. BNP (last 3 results) No results for input(s): PROBNP in the last 8760 hours. HbA1C: No results for input(s): HGBA1C in the last 72 hours. CBG: No results for input(s): GLUCAP in the last 168 hours. Lipid Profile: No results for input(s): CHOL, HDL, LDLCALC, TRIG, CHOLHDL, LDLDIRECT in the last 72 hours. Thyroid Function Tests: No results for input(s): TSH, T4TOTAL, FREET4, T3FREE, THYROIDAB in the last 72 hours. Anemia Panel: No results for input(s): VITAMINB12, FOLATE, FERRITIN, TIBC, IRON, RETICCTPCT in the last 72 hours. Urine analysis:    Component Value Date/Time   COLORURINE Yellow 07/14/2012 2232   APPEARANCEUR Hazy 07/14/2012 2232   LABSPEC 1.024 07/14/2012 2232   PHURINE 6.0 07/14/2012 2232   GLUCOSEU Negative 07/14/2012 2232   HGBUR Negative 07/14/2012 2232   BILIRUBINUR neg 02/10/2019 0847   BILIRUBINUR Negative 07/14/2012 2232   Benjamin Stain  1+ 07/14/2012 2232   PROTEINUR Negative 02/10/2019 0847   PROTEINUR Negative 07/14/2012 2232   UROBILINOGEN 0.2 02/10/2019 0847   NITRITE neg 02/10/2019 0847   NITRITE Negative 07/14/2012 2232   LEUKOCYTESUR Negative 02/10/2019 0847   LEUKOCYTESUR Negative 07/14/2012 2232   Sepsis Labs: @LABRCNTIP (procalcitonin:4,lacticacidven:4)  ) Recent Results (from the past 240 hour(s))  SARS CORONAVIRUS 2 (TAT 6-24 HRS) Nasopharyngeal Nasopharyngeal Swab     Status: None   Collection Time: 09/10/19  5:13 PM   Specimen: Nasopharyngeal Swab  Result Value Ref Range Status   SARS Coronavirus 2 NEGATIVE NEGATIVE Final    Comment: (NOTE) SARS-CoV-2 target nucleic acids are NOT DETECTED. The SARS-CoV-2 RNA is generally detectable in upper and lower respiratory specimens during the acute  phase of infection. Negative results do not preclude SARS-CoV-2 infection, do not rule out co-infections with other pathogens, and should not be used as the sole basis for treatment or other patient management decisions. Negative results must be combined with clinical observations, patient history, and epidemiological information. The expected result is Negative. Fact Sheet for Patients: SugarRoll.be Fact Sheet for Healthcare Providers: https://www.woods-mathews.com/ This test is not yet approved or cleared by the [place] FDA and  has been authorized for detection and/or diagnosis of SARS-CoV-2 by FDA under an Emergency Use Authorization (EUA). This EUA will remain  in effect (meaning this test can be used) for the duration of the COVID-19 declaration under Section 56 4(b)(1) of the Act, 21 U.S.C. section 360bbb-3(b)(1), unless the authorization is terminated or revoked sooner. Performed at [facility], [place] [address], [place], [place] [address]          Radiology Studies: Ct Angio Chest Pe W Or Wo Contrast  Result Date: 09/13/2019 CLINICAL DATA:  Back and chest pain. Status post left hip fracture repair. EXAM: CT ANGIOGRAPHY CHEST WITH CONTRAST TECHNIQUE: Multidetector CT imaging of the chest was performed using the standard protocol during bolus administration of intravenous contrast. Multiplanar CT image reconstructions and MIPs were obtained to evaluate the vascular anatomy. CONTRAST:  58mL OMNIPAQUE IOHEXOL 350 MG/ML SOLN COMPARISON:  Chest radiograph 09/11/2019.  No prior chest CT. FINDINGS: Cardiovascular: The quality of this exam for evaluation of pulmonary embolism is moderate to good. The bolus is centered in the SVC. No evidence of pulmonary embolism. Aortic and branch vessel atherosclerosis. No aortic dissection. Tortuous thoracic aorta. Mild cardiomegaly, without pericardial effusion. Multivessel coronary artery  atherosclerosis. Mediastinum/Nodes: No mediastinal or hilar adenopathy. Lungs/Pleura: No pleural fluid. Mild centrilobular emphysema. 3 mm left upper lobe pulmonary nodule on 39/6. Left lower lobe and right upper lobe dependent airspace disease. Minimal posterior left upper lobe airspace disease which could represent atelectasis. Upper Abdomen: Normal imaged portions of the liver, stomach, adrenal glands, right kidney. Old granulomatous disease in the spleen. Musculoskeletal: Lower thoracic spondylosis. Review of the MIP images confirms the above findings. IMPRESSION: 1.  No evidence of pulmonary embolism. 2. Left lower and posterior right upper lobe airspace disease, favoring aspiration. Posterior left upper lobe minimal opacity could also represent aspiration or subsegmental atelectasis. 3. Aortic atherosclerosis (ICD10-I70.0), coronary artery atherosclerosis and emphysema (ICD10-J43.9). 4. 3 mm left upper lobe pulmonary nodule. Non-contrast chest CT can be considered in 12 months, given risk factors for primary bronchogenic carcinoma. This recommendation follows the consensus statement: Guidelines for Management of Incidental Pulmonary Nodules Detected on CT Images: From the Fleischner Society 2017; Radiology 2017; 284:228-243. Electronically Signed   By: Abigail Miyamoto M.D.   On: 09/13/2019 11:01  Dg Pelvis Portable  Result Date: 09/11/2019 CLINICAL DATA:  Postop right hip hemiarthroplasty EXAM: PORTABLE PELVIS 1-2 VIEWS COMPARISON:  09/10/2019 FINDINGS: Right hip hemiarthroplasty in satisfactory position and alignment. No fracture or complication. Left iliac artery stent. IMPRESSION: Satisfactory right hip hemiarthroplasty. Electronically Signed   By: Franchot Gallo M.D.   On: 09/11/2019 19:08   Dg Chest Port 1 View  Result Date: 09/11/2019 CLINICAL DATA:  Airway aspiration EXAM: PORTABLE CHEST 1 VIEW COMPARISON:  09/10/2019 FINDINGS: New area of left lower lobe airspace disease, probable pneumonia  given history. Right lung clear. Negative for heart failure or effusion. Apical scarring bilaterally. IMPRESSION: New left lower lobe airspace disease consistent with aspiration pneumonia. Electronically Signed   By: Franchot Gallo M.D.   On: 09/11/2019 19:16   Dg Hip Port Unilat With Pelvis 1v Right  Result Date: 09/11/2019 CLINICAL DATA:  Right femoral neck fracture. EXAM: DG HIP (WITH OR WITHOUT PELVIS) 1V PORT RIGHT COMPARISON:  Radiograph dated 09/10/2019 FINDINGS: The right femoral head and neck have been resected. Hip arthroplasty hardware with radiolucent femoral head component in place. Sponge overlies the hip joint. IMPRESSION: Hardware in place as described. Electronically Signed   By: Lorriane Shire M.D.   On: 09/11/2019 16:08        Scheduled Meds:  acetaminophen  1,000 mg Oral TID   amLODipine  10 mg Oral Daily   aspirin EC  81 mg Oral Daily   atorvastatin  20 mg Oral QHS   Chlorhexidine Gluconate Cloth  6 each Topical Daily   cloNIDine  0.1 mg Oral BID   docusate sodium  100 mg Oral BID   enoxaparin (LOVENOX) injection  40 mg Subcutaneous [id number]   folic acid  1 mg Oral Daily   influenza vaccine adjuvanted  0.5 mL Intramuscular Tomorrow-1000   metoprolol tartrate  25 mg Oral BID   nicotine  14 mg Transdermal Daily   pantoprazole  40 mg Oral Daily   quinapril  40 mg Oral Daily   cyanocobalamin  1,000 mcg Oral Daily   Continuous Infusions:  cefTRIAXone (ROCEPHIN)  IV 1 g (09/13/19 0808)     LOS: 3 days    Time spent: 25 minutes    Edwin Dada, MD [facility] 09/13/2019, 12:55 PM     Please page though [place] or Epic secure chat:  For [employer], Adult nurse

## 2019-09-13 NOTE — NC FL2 (Addendum)
[place] LEVEL OF CARE SCREENING TOOL     IDENTIFICATION  Patient Name: Alexander Alvarado Birthdate: 06/05/1941 Sex: male Admission Date (Current Location): 09/10/2019  [place] and [place] Number:  Engineering geologist and Address:  [facility], [address], [place], [place] [address]      Provider Number:    Attending Physician Name and Address:  Edwin Dada, *  Relative Name and Phone Number:  Emron Reckner [phone]    Current Level of Care: SNF Recommended Level of Care: [place] Prior Approval Number:    Date Approved/Denied:   PASRR Number: pending  Discharge Plan: SNF    Current Diagnoses: Patient Active Problem List   Diagnosis Date Noted  . Femur fracture, right ([place]) 09/10/2019  . Amputation of toe of left foot ([place]) 02/10/2019  . Pre-diabetes 01/07/2017  . B12 nutritional deficiency 12/07/2016  . Folic acid deficiency [id number]  . Cerebrovascular accident (CVA) due to occlusion of cerebral artery ([place]) 12/30/2015  . Senile ecchymosis 12/30/2015  . Essential (primary) hypertension 03/12/2015  . Gastro-esophageal reflux disease without esophagitis 03/12/2015  . Hyperlipidemia, mixed 03/12/2015  . Insomnia, persistent 03/12/2015  . Neoplasm of uncertain behavior of skin of hand 03/12/2015  . Arthritis, degenerative 03/12/2015  . Peripheral vascular disease ([place]) 03/12/2015  . Compulsive tobacco user syndrome 03/12/2015  . H/O alcohol abuse 10/29/2012    Orientation RESPIRATION BLADDER Height & Weight     Time, Self, Situation, Place  Normal Continent Weight: 170 lb (77.1 kg) Height:  5\' 11"  (180.3 cm)  BEHAVIORAL SYMPTOMS/MOOD NEUROLOGICAL BOWEL NUTRITION STATUS      Continent Diet  AMBULATORY STATUS COMMUNICATION OF NEEDS Skin   Extensive Assist Verbally Normal                       Personal Care Assistance Level of Assistance  Bathing, Feeding, Dressing Bathing  Assistance: Limited assistance Feeding assistance: Independent Dressing Assistance: Maximum assistance     Functional Limitations Info             SPECIAL CARE FACTORS FREQUENCY  PT (By licensed PT), OT (By licensed OT)     PT Frequency: 5x per week OT Frequency: 5x per week            Contractures Contractures Info: Not present    Additional Factors Info  Code Status Code Status Info: DNR             Current Medications (09/13/2019):  This is the current hospital active medication list Current Facility-Administered Medications  Medication Dose Route Frequency Provider Last Rate Last Dose  . acetaminophen (TYLENOL) tablet 1,000 mg  1,000 mg Oral TID Edwin Dada, MD      . amLODipine (NORVASC) tablet 10 mg  10 mg Oral Daily Leim Fabry, MD   10 mg at 09/13/19 0810  . aspirin EC tablet 81 mg  81 mg Oral Daily Leim Fabry, MD   81 mg at 09/13/19 [id number]  . atorvastatin (LIPITOR) tablet 20 mg  20 mg Oral QHS Leim Fabry, MD   20 mg at 09/12/19 2253  . bisacodyl (DULCOLAX) suppository 10 mg  10 mg Rectal Daily PRN Leim Fabry, MD   10 mg at 09/13/19 [id number]  . cefTRIAXone (ROCEPHIN) 1 g in sodium chloride 0.9 % 100 mL IVPB  1 g Intravenous Q24H Edwin Dada, MD 200 mL/hr at 09/13/19 0808 1 g at 09/13/19 0808  . Chlorhexidine Gluconate  Cloth 2 % PADS 6 each  6 each Topical Daily Danford, Suann Larry, MD   6 each at 09/13/19 [phone]  . cloNIDine (CATAPRES) tablet 0.1 mg  0.1 mg Oral BID Leim Fabry, MD   0.1 mg at 09/13/19 0809  . docusate sodium (COLACE) capsule 100 mg  100 mg Oral BID Leim Fabry, MD   100 mg at 09/13/19 0809  . enoxaparin (LOVENOX) injection 40 mg  40 mg Subcutaneous Q24H Leim Fabry, MD   40 mg at 09/13/19 [id number]  . folic acid (FOLVITE) tablet 1 mg  1 mg Oral Daily Leim Fabry, MD   1 mg at 09/13/19 0809  . influenza vaccine adjuvanted (FLUAD) injection 0.5 mL  0.5 mL Intramuscular Tomorrow-1000 Tu, Ching T, DO      .  menthol-cetylpyridinium (CEPACOL) lozenge 3 mg  1 lozenge Oral PRN Leim Fabry, MD       Or  . phenol (CHLORASEPTIC) mouth spray 1 spray  1 spray Mouth/Throat PRN Leim Fabry, MD      . metoprolol tartrate (LOPRESSOR) tablet 25 mg  25 mg Oral BID Edwin Dada, MD   25 mg at 09/13/19 0815  . nicotine (NICODERM CQ - dosed in mg/24 hours) patch 14 mg  14 mg Transdermal Daily Lang Snow, NP   14 mg at 09/13/19 [id number]  . ondansetron (ZOFRAN) tablet 4 mg  4 mg Oral Q6H PRN Leim Fabry, MD   4 mg at 09/11/19 2034   Or  . ondansetron [facility]) injection 4 mg  4 mg Intravenous Q6H PRN Leim Fabry, MD      . oxyCODONE (Oxy IR/ROXICODONE) immediate release tablet 5 mg  5 mg Oral Q3H PRN Leim Fabry, MD   5 mg at 09/13/19 1126  . pantoprazole (PROTONIX) EC tablet 40 mg  40 mg Oral Daily Leim Fabry, MD   40 mg at 09/13/19 0809  . quinapril (ACCUPRIL) tablet 40 mg  40 mg Oral Daily Leim Fabry, MD   40 mg at 09/13/19 0809  . senna-docusate (Senokot-S) tablet 1 tablet  1 tablet Oral QHS PRN Leim Fabry, MD      . vitamin B-12 (CYANOCOBALAMIN) tablet 1,000 mcg  1,000 mcg Oral Daily Leim Fabry, MD   1,000 mcg at 09/13/19 [id number]     Discharge Medications: Please see discharge summary for a list of discharge medications.  Relevant Imaging Results:  Relevant Lab Results:   Additional Information    Sophea Rackham, [place], [place]

## 2019-09-13 NOTE — Progress Notes (Signed)
Patient very down and appears depressed about his current situation, chaplain called to come talk with patient.

## 2019-09-13 NOTE — Progress Notes (Signed)
   09/13/19 1747  Clinical Encounter Type  Visited With Patient  Visit Type Follow-up  Referral From Nurse  Consult/Referral To Chaplain  Spiritual Encounters  Spiritual Needs Emotional  CH entered room and pt was receiving care from medical staff. Introduced self and offered pastoral care. Pt shared feelings which this author normalized. Provided pastoral care through validation of emotions. No further visits are needed. Shared pastoral experience with patient's nurse RN Estill Bamberg.

## 2019-09-13 NOTE — Progress Notes (Signed)
Physical Therapy Treatment Patient Details Name: Alexander Alvarado MRN: [id number] DOB: 1941-03-14 Today's Date: 09/13/2019    History of Present Illness Alexander Alvarado is a 78 y.o. male with medical history significant of CVA with residual left-sided weakness, peripheral vascular disease s/p left foot toe amputation, hypertension hyperlipidemia who presents status post mechanical fall. Now S/P right hip hemiarthroplasty    PT Comments    Patient is seen for therapeutic exercise BLE and bed mobility training. He is reporting that he feels like he is in a twist and is uncomfortable. He needs max assist for supine<> sit bed mobility. He is able to sit and work on sitting balance and he improves with using BUE support and feet on the floor but is still unable to perform sit to stand transfer verbally saying that he just doesn't have the strength to stand. He is positioned back in bed with hip abd pillow and SCD's and repositioned several times, but continues to say he feels like he is in a "twist" and does not like his bed. Nsg is notified that patient is not abel to get comfortable and possibly needs pain medicine. Patient will continue to benefit from skilled PT to improve mobility and strength.   Follow Up Recommendations  SNF     Equipment Recommendations  Rolling walker with 5" wheels    Recommendations for Other Services       Precautions / Restrictions Precautions Precautions: Posterior Hip Restrictions Weight Bearing Restrictions: Yes RLE Weight Bearing: Weight bearing as tolerated    Mobility  Bed Mobility Overal bed mobility: Needs Assistance Bed Mobility: Rolling;Supine to Sit;Sit to Supine Rolling: Max assist   Supine to sit: Max assist Sit to supine: Max assist   General bed mobility comments: needs total assist  Transfers Overall transfer level: (poor sitting balance and weakness limiting transfers)                  Ambulation/Gait                  Stairs             Wheelchair Mobility    Modified Rankin (Stroke Patients Only)       Balance Overall balance assessment: Needs assistance Sitting-balance support: Bilateral upper extremity supported Sitting balance-[name]: Poor   Postural control: Posterior lean;Left lateral lean                                  Cognition Arousal/Alertness: Awake/alert Behavior During Therapy: WFL for tasks assessed/performed Overall Cognitive Status: Within Functional Limits for tasks assessed                                        Exercises Other Exercises Other Exercises: (BLE: heel slides, SLR, quad set, ankle pump, LAQ x 10)    General Comments        Pertinent Vitals/Pain Pain Assessment: 0-10 Pain Score: 7  Pain Location: (right hip) Pain Descriptors / Indicators: Aching Pain Intervention(s): Limited activity within patient's tolerance;Monitored during session    Home Living                      Prior Function            PT Goals (current goals can now be found in the  care plan section) Acute Rehab PT Goals Patient Stated Goal: To get stronger and have less pain PT Goal Formulation: With patient Progress towards PT goals: Progressing toward goals    Frequency    BID      PT Plan Current plan remains appropriate    Co-evaluation              AM-PAC PT "6 Clicks" Mobility   Outcome Measure  Help needed turning from your back to your side while in a flat bed without using bedrails?: Total Help needed moving from lying on your back to sitting on the side of a flat bed without using bedrails?: Total Help needed moving to and from a bed to a chair (including a wheelchair)?: Total Help needed standing up from a chair using your arms (e.g., wheelchair or bedside chair)?: Total Help needed to walk in hospital room?: Total Help needed climbing 3-5 steps with a railing? : Total 6 Click Score: 6     End of Session Equipment Utilized During Treatment: Gait belt;Oxygen Activity Tolerance: Patient limited by pain Patient left: in bed;with call bell/phone within reach Nurse Communication: Mobility status PT Visit Diagnosis: Unsteadiness on feet (R26.81);Muscle weakness (generalized) (M62.81);Difficulty in walking, not elsewhere classified (R26.2);Pain Pain - Right/Left: Right Pain - part of body: Hip     Time: 1045-1100 PT Time Calculation (min) (ACUTE ONLY): 15 min  Charges:  $Therapeutic Activity: 8-22 mins                     {   Alanson Puls, PT DPT 09/13/2019, 12:44 PM

## 2019-09-13 NOTE — NC FL2 (Signed)
[place] LEVEL OF CARE SCREENING TOOL     IDENTIFICATION  Patient Name: Alexander Alvarado Birthdate: 08/22/1941 Sex: male Admission Date (Current Location): 09/10/2019  [place] and [place] Number:  Engineering geologist and Address:  [facility], [address], [place], [place] [address]      Provider Number:    Attending Physician Name and Address:  Edwin Dada, *  Relative Name and Phone Number:  Kurtiss Coovert [phone]    Current Level of Care: SNF Recommended Level of Care: [place] Prior Approval Number:    Date Approved/Denied: 09/13/19 PASRR Number: [id number]  Discharge Plan: SNF    Current Diagnoses: Patient Active Problem List   Diagnosis Date Noted  . Femur fracture, right ([place]) 09/10/2019  . Amputation of toe of left foot ([place]) 02/10/2019  . Pre-diabetes 01/07/2017  . B12 nutritional deficiency 12/07/2016  . Folic acid deficiency [id number]  . Cerebrovascular accident (CVA) due to occlusion of cerebral artery ([place]) 12/30/2015  . Senile ecchymosis 12/30/2015  . Essential (primary) hypertension 03/12/2015  . Gastro-esophageal reflux disease without esophagitis 03/12/2015  . Hyperlipidemia, mixed 03/12/2015  . Insomnia, persistent 03/12/2015  . Neoplasm of uncertain behavior of skin of hand 03/12/2015  . Arthritis, degenerative 03/12/2015  . Peripheral vascular disease ([place]) 03/12/2015  . Compulsive tobacco user syndrome 03/12/2015  . H/O alcohol abuse 10/29/2012    Orientation RESPIRATION BLADDER Height & Weight     Time, Self, Situation, Place  Normal Continent Weight: 170 lb (77.1 kg) Height:  5\' 11"  (180.3 cm)  BEHAVIORAL SYMPTOMS/MOOD NEUROLOGICAL BOWEL NUTRITION STATUS      Continent Diet  AMBULATORY STATUS COMMUNICATION OF NEEDS Skin   Extensive Assist Verbally Normal                       Personal Care Assistance Level of Assistance  Bathing, Feeding, Dressing  Bathing Assistance: Limited assistance Feeding assistance: Independent Dressing Assistance: Maximum assistance     Functional Limitations Info             SPECIAL CARE FACTORS FREQUENCY  PT (By licensed PT), OT (By licensed OT)     PT Frequency: 5x per week OT Frequency: 5x per week            Contractures Contractures Info: Not present    Additional Factors Info  Code Status Code Status Info: DNR             Current Medications (09/13/2019):  This is the current hospital active medication list Current Facility-Administered Medications  Medication Dose Route Frequency Provider Last Rate Last Dose  . acetaminophen (TYLENOL) tablet 1,000 mg  1,000 mg Oral TID Edwin Dada, MD      . amLODipine (NORVASC) tablet 10 mg  10 mg Oral Daily Leim Fabry, MD   10 mg at 09/13/19 0810  . aspirin EC tablet 81 mg  81 mg Oral Daily Leim Fabry, MD   81 mg at 09/13/19 [id number]  . atorvastatin (LIPITOR) tablet 20 mg  20 mg Oral QHS Leim Fabry, MD   20 mg at 09/12/19 2253  . bisacodyl (DULCOLAX) suppository 10 mg  10 mg Rectal Daily PRN Leim Fabry, MD   10 mg at 09/13/19 [id number]  . cefTRIAXone (ROCEPHIN) 1 g in sodium chloride 0.9 % 100 mL IVPB  1 g Intravenous Q24H Edwin Dada, MD 200 mL/hr at 09/13/19 0808 1 g at 09/13/19 0808  . Chlorhexidine Gluconate Cloth  2 % PADS 6 each  6 each Topical Daily Danford, Suann Larry, MD   6 each at 09/13/19 [phone]  . cloNIDine (CATAPRES) tablet 0.1 mg  0.1 mg Oral BID Leim Fabry, MD   0.1 mg at 09/13/19 0809  . docusate sodium (COLACE) capsule 100 mg  100 mg Oral BID Leim Fabry, MD   100 mg at 09/13/19 0809  . enoxaparin (LOVENOX) injection 40 mg  40 mg Subcutaneous Q24H Leim Fabry, MD   40 mg at 09/13/19 [id number]  . folic acid (FOLVITE) tablet 1 mg  1 mg Oral Daily Leim Fabry, MD   1 mg at 09/13/19 0809  . influenza vaccine adjuvanted (FLUAD) injection 0.5 mL  0.5 mL Intramuscular Tomorrow-1000 Tu, Ching T, DO      .  menthol-cetylpyridinium (CEPACOL) lozenge 3 mg  1 lozenge Oral PRN Leim Fabry, MD       Or  . phenol (CHLORASEPTIC) mouth spray 1 spray  1 spray Mouth/Throat PRN Leim Fabry, MD      . metoprolol tartrate (LOPRESSOR) tablet 25 mg  25 mg Oral BID Edwin Dada, MD   25 mg at 09/13/19 0815  . nicotine (NICODERM CQ - dosed in mg/24 hours) patch 14 mg  14 mg Transdermal Daily Lang Snow, NP   14 mg at 09/13/19 [id number]  . ondansetron (ZOFRAN) tablet 4 mg  4 mg Oral Q6H PRN Leim Fabry, MD   4 mg at 09/11/19 2034   Or  . ondansetron [facility]) injection 4 mg  4 mg Intravenous Q6H PRN Leim Fabry, MD      . oxyCODONE (Oxy IR/ROXICODONE) immediate release tablet 5 mg  5 mg Oral Q3H PRN Leim Fabry, MD   5 mg at 09/13/19 1126  . pantoprazole (PROTONIX) EC tablet 40 mg  40 mg Oral Daily Leim Fabry, MD   40 mg at 09/13/19 0809  . quinapril (ACCUPRIL) tablet 40 mg  40 mg Oral Daily Leim Fabry, MD   40 mg at 09/13/19 0809  . senna-docusate (Senokot-S) tablet 1 tablet  1 tablet Oral QHS PRN Leim Fabry, MD      . vitamin B-12 (CYANOCOBALAMIN) tablet 1,000 mcg  1,000 mcg Oral Daily Leim Fabry, MD   1,000 mcg at 09/13/19 [id number]     Discharge Medications: Please see discharge summary for a list of discharge medications.  Relevant Imaging Results:  Relevant Lab Results:   Additional Information    , [place], [place]

## 2019-09-13 NOTE — TOC Initial Note (Signed)
Transition of Care [facility]) - Initial/Assessment Note    Patient Details  Name: Alexander Alvarado MRN: [id number] Date of Birth: Feb 13, 1941  Transition of Care [facility]) CM/SW Contact:    Elliot Gurney [place], [place] Phone Number: 09/13/2019, 11:10 AM  Clinical Narrative:                 Patient is a 78 y.o. male with medical history significant of CVA with residual left-sided weakness, peripheral vascular disease s/p left foot toe amputation, hypertension hyperlipidemia admitted following a fall.  Per  patient, he was using his walker at that time.  Patient reports that he resides alone in his own home. He reports having a very limited family support network, stating that he has two sons one residing in [place] and the other in [place] that he talks to by phone regularly.  Patient complained of constant pain during the assessment and was not sure what the next steps would be for him. Patient did agree to rehab following hospitalization and has no preference in terms of facilities, however would like to remain in the [facility] area. This Education officer, museum to complete the FL2 and PASARR. FL2 to be faxed out for bed offers.  Social work to continue to follow to assist with discharge planning.   Alisha Bacus, [place] Clinical Social Worker  [phone]   Expected Discharge Plan: Skilled Nursing Facility Barriers to Discharge: No Barriers Identified   Patient Goals and CMS Choice Patient states their goals for this hospitalization and ongoing recovery are:: I want to be able to move      Expected Discharge Plan and Services Expected Discharge Plan: [place] In-house Referral: Clinical Social Work   Post Acute Care Choice: [place] Living arrangements for the past 2 months: [place]                                      Prior Living Arrangements/Services Living arrangements for the past 2 months: Single Family Home Lives with::  Self Patient language and need for interpreter reviewed:: No Do you feel safe going back to the place where you live?: Yes      Need for Family Participation in Patient Care: No (Comment) Care giver support system in place?: No (comment)   Criminal Activity/Legal Involvement Pertinent to Current Situation/Hospitalization: No - Comment as needed  Activities of Daily Living Home Assistive Devices/Equipment: [place] Rile (specify type) ADL Screening (condition at time of admission) Patient's cognitive ability adequate to safely complete daily activities?: Yes Is the patient deaf or have difficulty hearing?: No Does the patient have difficulty seeing, even when wearing glasses/contacts?: No Does the patient have difficulty concentrating, remembering, or making decisions?: No Patient able to express need for assistance with ADLs?: Yes Does the patient have difficulty dressing or bathing?: Yes Independently performs ADLs?: No Communication: Independent Dressing (OT): Needs assistance Is this a change from baseline?: Pre-admission baseline Grooming: Needs assistance Is this a change from baseline?: Pre-admission baseline Feeding: Independent Bathing: Needs assistance Is this a change from baseline?: Pre-admission baseline Toileting: Needs assistance Is this a change from baseline?: Change from baseline, expected to last >3days In/Out Bed: Needs assistance Is this a change from baseline?: Change from baseline, expected to last >3 days Walks in Home: Needs assistance Is this a change from baseline?: Change from baseline, expected to last >3 days Does the patient have difficulty walking or  climbing stairs?: Yes Weakness of Legs: Both Weakness of Arms/Hands: Both  Permission Sought/Granted                  Emotional Assessment Appearance:: Appears stated age Attitude/Demeanor/Rapport: Engaged(however complained of pain and being unable to get comfortable) Affect (typically observed):  Accepting Orientation: : Oriented to Self, Oriented to Place, Oriented to  Time, Oriented to Situation Alcohol / Substance Use: Not Applicable Psych Involvement: No (comment)  Admission diagnosis:  Hip fracture ([place]) [S72.009A] Fracture of unspecified part of neck of right femur, initial encounter for closed fracture ([place]) [S72.001A] Leukocytosis, unspecified type [D72.829] Patient Active Problem List   Diagnosis Date Noted  . Femur fracture, right ([place]) 09/10/2019  . Amputation of toe of left foot ([place]) 02/10/2019  . Pre-diabetes 01/07/2017  . B12 nutritional deficiency 12/07/2016  . Folic acid deficiency [id number]  . Cerebrovascular accident (CVA) due to occlusion of cerebral artery ([place]) 12/30/2015  . Senile ecchymosis 12/30/2015  . Essential (primary) hypertension 03/12/2015  . Gastro-esophageal reflux disease without esophagitis 03/12/2015  . Hyperlipidemia, mixed 03/12/2015  . Insomnia, persistent 03/12/2015  . Neoplasm of uncertain behavior of skin of hand 03/12/2015  . Arthritis, degenerative 03/12/2015  . Peripheral vascular disease ([place]) 03/12/2015  . Compulsive tobacco user syndrome 03/12/2015  . H/O alcohol abuse 10/29/2012   PCP:  Glean Hess, MD Pharmacy:   [facility] [address] [place] [address] Phone: [phone] Fax: [phone]     Social Determinants of Health (SDOH) Interventions    Readmission Risk Interventions No flowsheet data found.

## 2019-09-13 NOTE — Progress Notes (Signed)
  Subjective: 2 Days Post-Op Procedure(s) (LRB): ARTHROPLASTY BIPOLAR HIP (HEMIARTHROPLASTY) (Right) Patient reports pain as moderate.   Patient seen in rounds with Dr. Marry Guan. Patient is well, and has had no acute complaints or problems Plan is to go Rehab after hospital stay. Negative for chest pain and shortness of breath Fever: no Gastrointestinal: Negative for nausea and vomiting  Objective: Vital signs in last 24 hours: Temp:  [97.8 F (36.6 C)-98.4 F (36.9 C)] 98.1 F (36.7 C) (11/22 0249) Pulse Rate:  [63-82] 63 (11/22 0249) Resp:  [16-20] 20 (11/22 0249) BP: (143-156)/(76-79) 146/78 (11/22 0249) SpO2:  [75 %-95 %] 92 % (11/22 0249)  Intake/Output from previous day:  Intake/Output Summary (Last 24 hours) at 09/13/2019 0631 Last data filed at 09/13/2019 0400 Gross per 24 hour  Intake 460 ml  Output 1000 ml  Net -540 ml    Intake/Output this shift: Total I/O In: 360 [P.O.:360] Out: 200 [Urine:200]  Labs: Recent Labs    09/10/19 1703 09/11/19 0444 09/12/19 0409  HGB 20.6* 20.0* 16.9   Recent Labs    09/11/19 0444 09/12/19 0409  WBC 17.3* 17.0*  RBC 6.33* 5.29  HCT 58.7* 48.4  PLT 162 129*   Recent Labs    09/11/19 0444 09/12/19 0409  NA 137 135  K 4.0 3.5  CL 100 98  CO2 25 26  BUN 16 21  CREATININE 0.76 0.70  GLUCOSE 120* 115*  CALCIUM 8.9 8.1*   Recent Labs    09/10/19 1703  INR 1.1     EXAM General - Patient is Alert and Oriented Extremity - Neurovascular intact Sensation intact distally Dorsiflexion/Plantar flexion intact Compartment soft Dressing/Incision - clean, dry, no drainage Motor Function - intact, moving foot and toes well on exam.   Past Medical History:  Diagnosis Date  . Depression   . GERD (gastroesophageal reflux disease)   . High cholesterol   . Hypertension   . PVD (peripheral vascular disease) ([place])   . Stroke ([place])    left side weakness    Assessment/Plan: 2 Days Post-Op Procedure(s)  (LRB): ARTHROPLASTY BIPOLAR HIP (HEMIARTHROPLASTY) (Right) Active Problems:   Essential (primary) hypertension   Gastro-esophageal reflux disease without esophagitis   Hyperlipidemia, mixed   Insomnia, persistent   Peripheral vascular disease (HCC)   Cerebrovascular accident (CVA) due to occlusion of cerebral artery (HCC)   Amputation of toe of left foot (HCC)   Femur fracture, right ([place])  Estimated body mass index is 23.71 kg/m as calculated from the following:   Height as of this encounter: 5\' 11"  (1.803 m).   Weight as of this encounter: 77.1 kg. Advance diet Up with therapy D/C IV fluids  Discharge planning.  Follow-up at [facility] clinic in 2 weeks for staple removal.  DVT Prophylaxis - Lovenox, Foot Pumps and TED hose Weight-Bearing as tolerated to right leg  Reche Dixon, PA-C Orthopaedic Surgery 09/13/2019, 6:31 AM

## 2019-09-14 LAB — CBC
HCT: 47.9 % (ref 39.0–52.0)
Hemoglobin: 16.6 g/dL (ref 13.0–17.0)
MCH: 32.2 pg (ref 26.0–34.0)
MCHC: 34.7 g/dL (ref 30.0–36.0)
MCV: 92.8 fL (ref 80.0–100.0)
Platelets: 144 10*3/uL — ABNORMAL LOW (ref 150–400)
RBC: 5.16 MIL/uL (ref 4.22–5.81)
RDW: 13 % (ref 11.5–15.5)
WBC: 10.4 10*3/uL (ref 4.0–10.5)
nRBC: 0 % (ref 0.0–0.2)

## 2019-09-14 LAB — ERYTHROPOIETIN: Erythropoietin: 9.7 m[IU]/mL (ref 2.6–18.5)

## 2019-09-14 LAB — BASIC METABOLIC PANEL
Anion gap: 13 (ref 5–15)
BUN: 23 mg/dL (ref 8–23)
CO2: 23 mmol/L (ref 22–32)
Calcium: 8.3 mg/dL — ABNORMAL LOW (ref 8.9–10.3)
Chloride: 96 mmol/L — ABNORMAL LOW (ref 98–111)
Creatinine, Ser: 0.61 mg/dL (ref 0.61–1.24)
GFR calc Af Amer: >60 mL/min (ref >60)
GFR calc non Af Amer: >60 mL/min (ref >60)
Glucose, Bld: 80 mg/dL (ref 70–99)
Potassium: 4.5 mmol/L (ref 3.5–5.1)
Sodium: 132 mmol/L — ABNORMAL LOW (ref 135–145)

## 2019-09-14 NOTE — TOC Progression Note (Signed)
Transition of Care [facility]) - Progression Note    Patient Details  Name: BRODRICK FIELDHOUSE MRN: [id number] Date of Birth: August 28, 1941  Transition of Care [facility]) CM/SW [place], RN Phone Number: 09/14/2019, 10:08 AM  Clinical Narrative:    The patient is sound asleep and doesn't wake up when shook or name called \ I called the son Ed, we reviewed the bed choices,  He said that the patient lives alone and can't go back to that situation He said the patient was wake most of the night so he is glad the patient is asleep He agrees that the patient will go to [facility] and said that his dad would also agree. I Notified [facility] of the choice and called [place] to start insurance auth, spoke with Crissie They do not manage the patient's insurance, it would be regular [employer] I notified Otila [place]     Expected Discharge Plan: [place] Barriers to Discharge: No Barriers Identified  Expected Discharge Plan and Services Expected Discharge Plan: [place] In-house Referral: Clinical Social Work   Post Acute Care Choice: [place] Living arrangements for the past 2 months: Single Family Home                                       Social Determinants of Health (SDOH) Interventions    Readmission Risk Interventions No flowsheet data found.

## 2019-09-14 NOTE — Progress Notes (Signed)
PT Cancellation Note  Patient Details Name: Alexander Alvarado MRN: [id number] DOB: June 22, 1941   Cancelled Treatment:    Reason Eval/Treat Not Completed: Other (comment)(Pt woken by PT, intermittently able to keep eyes open. Repositioned in bed for bed pain at pt request and with CNA. O2 assessed, in mid 80s at 5L. RN notified and in room to assess pt.)   Lieutenant Diego PT, DPT 12:11 PM,09/14/19 [phone]

## 2019-09-14 NOTE — Progress Notes (Signed)
  Subjective: 3 Days Post-Op Procedure(s) (LRB): ARTHROPLASTY BIPOLAR HIP (HEMIARTHROPLASTY) (Right) Patient reports pain as moderate.   Patient seen in rounds with Dr. Marry Guan. Patient is well, and has had no acute complaints or problems.  The patient is having some depression about being in the hospital, but is quite confused. Plan is to go Rehab after hospital stay. Negative for chest pain and shortness of breath Fever: no Gastrointestinal: Negative for nausea and vomiting  Objective: Vital signs in last 24 hours: Temp:  [97.6 F (36.4 C)-98.9 F (37.2 C)] 98.1 F (36.7 C) (11/23 0509) Pulse Rate:  [52-85] 65 (11/23 0509) Resp:  [19] 19 (11/22 2109) BP: (140-159)/(72-79) 157/79 (11/23 0509) SpO2:  [67 %-95 %] 95 % (11/22 2109)  Intake/Output from previous day:  Intake/Output Summary (Last 24 hours) at 09/14/2019 0701 Last data filed at 09/13/2019 1900 Gross per 24 hour  Intake 120 ml  Output -  Net 120 ml    Intake/Output this shift: No intake/output data recorded.  Labs: Recent Labs    09/12/19 0409 09/13/19 0554 09/14/19 0538  HGB 16.9 16.2 16.6   Recent Labs    09/13/19 0554 09/14/19 0538  WBC 12.9* 10.4  RBC 5.05 5.16  HCT 47.7 47.9  PLT 118* 144*   Recent Labs    09/13/19 0554 09/14/19 0538  NA 135 132*  K 3.7 4.5  CL 97* 96*  CO2 27 23  BUN 24* 23  CREATININE 0.69 0.61  GLUCOSE 87 80  CALCIUM 8.5* 8.3*   No results for input(s): LABPT, INR in the last 72 hours.   EXAM General - Patient is Alert and Oriented Extremity - Neurovascular intact Sensation intact distally Dorsiflexion/Plantar flexion intact Compartment soft Dressing/Incision - clean, dry, no drainage Motor Function - intact, moving foot and toes well on exam.   Past Medical History:  Diagnosis Date  . Depression   . GERD (gastroesophageal reflux disease)   . High cholesterol   . Hypertension   . PVD (peripheral vascular disease) ([place])   . Stroke ([place])    left side  weakness    Assessment/Plan: 3 Days Post-Op Procedure(s) (LRB): ARTHROPLASTY BIPOLAR HIP (HEMIARTHROPLASTY) (Right) Active Problems:   Essential (primary) hypertension   Gastro-esophageal reflux disease without esophagitis   Hyperlipidemia, mixed   Insomnia, persistent   Peripheral vascular disease (HCC)   Cerebrovascular accident (CVA) due to occlusion of cerebral artery (HCC)   Amputation of toe of left foot (HCC)   Femur fracture, right ([place])  Estimated body mass index is 23.71 kg/m as calculated from the following:   Height as of this encounter: 5\' 11"  (1.803 m).   Weight as of this encounter: 77.1 kg. Advance diet Up with therapy D/C IV fluids  Discharge planning.  Follow[facility]up at [facility] clinic in 2 weeks for staple removal.  DVT Prophylaxis - Lovenox, Foot Pumps and TED hose Weight-Bearing as tolerated to right leg  Reche Dixon, PA-C Orthopaedic Surgery 09/14/2019, 7:01 AM

## 2019-09-14 NOTE — Progress Notes (Signed)
PT Cancellation Note  Patient Details Name: Alexander Alvarado MRN: [id number] DOB: 05-20-1941   Cancelled Treatment:    Reason Eval/Treat Not Completed: (RN at bedside to administer pain medications. PT to follow up as able.)  Lieutenant Diego PT, DPT 9:21 AM,09/14/19 [phone]

## 2019-09-14 NOTE — Progress Notes (Signed)
PROGRESS NOTE    Alexander Alvarado  [id number] DOB: 1940/11/11 DOA: 09/10/2019 PCP: Glean Hess, MD      Brief Narrative:  Mr. Stolarz is a 78 y.o. M with hx CVA residual left hemiparesis, chronic polycythemia, HTN, smoking, PVD s/p left partial foot amputation and ALF dwelling who presented with mechanical fall and left hip pain.  Patient was bending over when he lost his balance and fell.  No chest pain or loss of consciousness at the time of the fall.  In the ER, x-ray showed right femoral neck fracture.  Orthopedics were consulted and recommended operative repair.         Assessment & Plan:  Acute right femoral neck fracture S/p RIGHT hemiarthroplasty 11/20 by Dr. Posey Pronto Intraoperative course complicated by aspiration into chest.  Anesthesiology told nursing who told my partner overnight that this might have been "coffee ground" aspirate.  Creatinine stable this morning. Hemoglobin stable fr -Consult orthopedics, appreciate cares -PT recommends SNF   Acute hypoxic respiratory failure due to aspiration pneumonia Patient with increasing O2 needs overnight 11/21 after surgery. Chest x-ray showed new bibasilar opacities, interstitial change consistent with aspiration. Started on ceftriaxone. - needing more O2 and HFNC at times, likely due to mental status -Continue ceftriaxone -Minimize opiates - stop oxycodone as he's very lethargic -Continue turns, cough, incentive spirometer and flutter q2  Altered mental status - stop narcotics (per RN, he received one dose and since then he's sleepy)  Possible hematemesis There was hearsay when the patient had a rapid response 11/21 the patient had coffee ground emesis intraop.   His hemoglobin is remained stable since this was noted. Any has had no stools or hematemesis. I have a low clinical suspicion for a significant GI bleed. -Continue home PPI -FOBT all stool  Cerebrovascular disease secondary prevention Peripheral  vascular disease secondary prevention Hypertension Blood pressure mains elevated -Continue amlodipine, clonidine, metoprolol -Continue quinapril -Continue atorvastatin, aspirin  Bradycardia Brief asymptomatic pause today. Persistent bradycardia. -Continue metoprolol and clonidine for now, monitor telemetry  Erythrocytosis This appears to be chronic polycythemia.  Likely secondary polycythemia from smoking.  This does increase his risk of perioperative stroke.  Reasonable to obtain Jak testing. -Follow EPO and JAK level         MDM and disposition: The below labs and imaging reports reviewed and summarized above.  Medication management as above.   The patient was admitted with acute right femoral neck fracture.    Intraoperative course complicated now by aspiration and hypoxic respiratory failure - O2 requirement gone up today, needing 6-7 liters and HFNC at times. If worsens, consider lasix, avoid narcotics. May need SD care if clinical condition worsens     DVT prophylaxis: SCD Code Status: DO NOT RESUSCITATE Family Communication:     Consultants:   Orthopedics  Procedures:     Antimicrobials:    Ceftriaxone 11/21 >>   Subjective: sound asleep and wakes up to painful stimuli and verbal shout but falls back sleep easily Per nursing, patient was wake most of the night so he is glad the patient is asleep    Objective: Vitals:   09/14/19 0753 09/14/19 0800 09/14/19 1530 09/14/19 2025  BP: (!) 142/68  (!) 151/72   Pulse: 68  64   Resp: 18  18   Temp: (!) 97.3 F (36.3 C)  97.8 F (36.6 C)   TempSrc: Oral  Oral   SpO2: 96% 92% 96% 93%  Weight:      Height:  Intake/Output Summary (Last 24 hours) at 09/14/2019 2036 Last data filed at 09/14/2019 1118 Gross per 24 hour  Intake 200 ml  Output -  Net 200 ml   Filed Weights   09/10/19 1517  Weight: 77.1 kg    Examination: General appearance:  adult male, alert and in no acute distress.    HEENT: Anicteric, conjunctiva pink, lids and lashes normal. No nasal deformity, discharge, epistaxis.  Lips moist, teeth normal. OP moist, no oral lesions.   Skin: Warm and dry.  No suspicious rashes or lesions. Cardiac: RRR, no murmurs appreciated. JVP normal no LE edema.    Respiratory: Normal respiratory rate and rhythm. Diminished throughout, no wheezing. Abdomen: Abdomen soft. No TTP or guarding. No ascites, distension, hepatosplenomegaly.   MSK: No deformities or effusions of the large joints of the upper or lower extremities bilaterally. Diffuse loss of subcutaneous muscle mass and fat. Neuro: Sleepy, lethargic, dozing off while talking.  Muscle tone diminished, without fasciculations.  Moves left side with slight weakness relative to right. Leg strength not tested due to fracture. Speech fluent when awake.    Psych: Sensorium intact and responding to questions, attention normal. Affect normal.  Judgment and insight appear normal.        Data Reviewed: I have personally reviewed following labs and imaging studies:  CBC: Recent Labs  Lab 09/10/19 1703 09/11/19 0444 09/12/19 0409 09/13/19 0554 09/14/19 0538  WBC 21.9* 17.3* 17.0* 12.9* 10.4  NEUTROABS 18.2*  --   --   --   --   HGB 20.6* 20.0* 16.9 16.2 16.6  HCT 59.1* 58.7* 48.4 47.7 47.9  MCV 91.2 92.7 91.5 94.5 92.8  PLT 171 162 129* 118* [id number]*   Basic Metabolic Panel: Recent Labs  Lab 09/10/19 1703 09/11/19 0444 09/12/19 0409 09/13/19 0554 09/14/19 0538  NA 136 137 135 135 132*  K 3.6 4.0 3.5 3.7 4.5  CL 99 100 98 97* 96*  CO2 25 25 26 27 23   GLUCOSE 118* 120* 115* 87 80  BUN 12 16 21  24* 23  CREATININE 0.81 0.76 0.70 0.69 0.61  CALCIUM 9.3 8.9 8.1* 8.5* 8.3*   GFR: Estimated Creatinine Clearance: 83.7 mL/min (by C-G formula based on SCr of 0.61 mg/dL). Liver Function Tests: Recent Labs  Lab 09/10/19 1703  AST 32  ALT 19  ALKPHOS 138*  BILITOT 1.5*  PROT 7.6  ALBUMIN 4.3   No results for  input(s): LIPASE, AMYLASE in the last 168 hours. No results for input(s): AMMONIA in the last 168 hours. Coagulation Profile: Recent Labs  Lab 09/10/19 1703  INR 1.1   Cardiac Enzymes: No results for input(s): CKTOTAL, CKMB, CKMBINDEX, TROPONINI in the last 168 hours. BNP (last 3 results) No results for input(s): PROBNP in the last 8760 hours. HbA1C: No results for input(s): HGBA1C in the last 72 hours. CBG: No results for input(s): GLUCAP in the last 168 hours. Lipid Profile: No results for input(s): CHOL, HDL, LDLCALC, TRIG, CHOLHDL, LDLDIRECT in the last 72 hours. Thyroid Function Tests: No results for input(s): TSH, T4TOTAL, FREET4, T3FREE, THYROIDAB in the last 72 hours. Anemia Panel: No results for input(s): VITAMINB12, FOLATE, FERRITIN, TIBC, IRON, RETICCTPCT in the last 72 hours. Urine analysis:    Component Value Date/Time   COLORURINE Yellow 07/14/2012 2232   APPEARANCEUR Hazy 07/14/2012 2232   LABSPEC 1.024 07/14/2012 2232   PHURINE 6.0 07/14/2012 2232   GLUCOSEU Negative 07/14/2012 2232   HGBUR Negative 07/14/2012 2232   BILIRUBINUR neg 02/10/2019 0847  BILIRUBINUR Negative 07/14/2012 2232   KETONESUR 1+ 07/14/2012 2232   PROTEINUR Negative 02/10/2019 0847   PROTEINUR Negative 07/14/2012 2232   UROBILINOGEN 0.2 02/10/2019 0847   NITRITE neg 02/10/2019 0847   NITRITE Negative 07/14/2012 2232   LEUKOCYTESUR Negative 02/10/2019 0847   LEUKOCYTESUR Negative 07/14/2012 2232   Sepsis Labs: @LABRCNTIP (procalcitonin:4,lacticacidven:4)  ) Recent Results (from the past 240 hour(s))  SARS CORONAVIRUS 2 (TAT 6-24 HRS) Nasopharyngeal Nasopharyngeal Swab     Status: None   Collection Time: 09/10/19  5:13 PM   Specimen: Nasopharyngeal Swab  Result Value Ref Range Status   SARS Coronavirus 2 NEGATIVE NEGATIVE Final    Comment: (NOTE) SARS-CoV-2 target nucleic acids are NOT DETECTED. The SARS-CoV-2 RNA is generally detectable in upper and lower respiratory specimens  during the acute phase of infection. Negative results do not preclude SARS-CoV-2 infection, do not rule out co-infections with other pathogens, and should not be used as the sole basis for treatment or other patient management decisions. Negative results must be combined with clinical observations, patient history, and epidemiological information. The expected result is Negative. Fact Sheet for Patients: SugarRoll.be Fact Sheet for Healthcare Providers: https://www.woods-mathews.com/ This test is not yet approved or cleared by the [place] FDA and  has been authorized for detection and/or diagnosis of SARS-CoV-2 by FDA under an Emergency Use Authorization (EUA). This EUA will remain  in effect (meaning this test can be used) for the duration of the COVID-19 declaration under Section 56 4(b)(1) of the Act, 21 U.S.C. section 360bbb-3(b)(1), unless the authorization is terminated or revoked sooner. Performed at [facility], [place] [address], [place], [place] [address]          Radiology Studies: Ct Angio Chest Pe W Or Wo Contrast  Result Date: 09/13/2019 CLINICAL DATA:  Back and chest pain. Status post left hip fracture repair. EXAM: CT ANGIOGRAPHY CHEST WITH CONTRAST TECHNIQUE: Multidetector CT imaging of the chest was performed using the standard protocol during bolus administration of intravenous contrast. Multiplanar CT image reconstructions and MIPs were obtained to evaluate the vascular anatomy. CONTRAST:  69mL OMNIPAQUE IOHEXOL 350 MG/ML SOLN COMPARISON:  Chest radiograph 09/11/2019.  No prior chest CT. FINDINGS: Cardiovascular: The quality of this exam for evaluation of pulmonary embolism is moderate to good. The bolus is centered in the SVC. No evidence of pulmonary embolism. Aortic and branch vessel atherosclerosis. No aortic dissection. Tortuous thoracic aorta. Mild cardiomegaly, without pericardial effusion. Multivessel  coronary artery atherosclerosis. Mediastinum/Nodes: No mediastinal or hilar adenopathy. Lungs/Pleura: No pleural fluid. Mild centrilobular emphysema. 3 mm left upper lobe pulmonary nodule on 39/6. Left lower lobe and right upper lobe dependent airspace disease. Minimal posterior left upper lobe airspace disease which could represent atelectasis. Upper Abdomen: Normal imaged portions of the liver, stomach, adrenal glands, right kidney. Old granulomatous disease in the spleen. Musculoskeletal: Lower thoracic spondylosis. Review of the MIP images confirms the above findings. IMPRESSION: 1.  No evidence of pulmonary embolism. 2. Left lower and posterior right upper lobe airspace disease, favoring aspiration. Posterior left upper lobe minimal opacity could also represent aspiration or subsegmental atelectasis. 3. Aortic atherosclerosis (ICD10-I70.0), coronary artery atherosclerosis and emphysema (ICD10-J43.9). 4. 3 mm left upper lobe pulmonary nodule. Non-contrast chest CT can be considered in 12 months, given risk factors for primary bronchogenic carcinoma. This recommendation follows the consensus statement: Guidelines for Management of Incidental Pulmonary Nodules Detected on CT Images: From the Fleischner Society 2017; Radiology 2017; 284:228-243. Electronically Signed   By: Abigail Miyamoto  M.D.   On: 09/13/2019 11:01        Scheduled Meds: . acetaminophen  1,000 mg Oral TID  . amLODipine  10 mg Oral Daily  . aspirin EC  81 mg Oral Daily  . atorvastatin  20 mg Oral QHS  . cloNIDine  0.1 mg Oral BID  . docusate sodium  100 mg Oral BID  . enoxaparin (LOVENOX) injection  40 mg Subcutaneous Q24H  . folic acid  1 mg Oral Daily  . influenza vaccine adjuvanted  0.5 mL Intramuscular Tomorrow-1000  . metoprolol tartrate  25 mg Oral BID  . nicotine  14 mg Transdermal Daily  . pantoprazole  40 mg Oral Daily  . quinapril  40 mg Oral Daily  . cyanocobalamin  1,000 mcg Oral Daily   Continuous Infusions: .  cefTRIAXone (ROCEPHIN)  IV Stopped (09/14/19 1005)     LOS: 4 days    Time spent: 25 minutes    Max Sane, MD [facility] 09/14/2019, 8:36 PM     Please page though [place] or Epic secure chat:  For [employer], Adult nurse

## 2019-09-14 NOTE — Evaluation (Addendum)
Clinical/Bedside Swallow Evaluation Patient Details  Name: Alexander Alvarado MRN: [id number] Date of Birth: 11/09/1940  Today's Date: 09/14/2019 Time: SLP Start Time (ACUTE ONLY): [id number] SLP Stop Time (ACUTE ONLY): 1402 SLP Time Calculation (min) (ACUTE ONLY): 57 min  Past Medical History:  Past Medical History:  Diagnosis Date  . Depression   . GERD (gastroesophageal reflux disease)   . High cholesterol   . Hypertension   . PVD (peripheral vascular disease) ([place])   . Stroke [facility])    left side weakness   Past Surgical History:  Past Surgical History:  Procedure Laterality Date  . AMPUTATION Left 08/31/2015   Procedure: AMPUTATION DIGIT left 2nd toe ;  Surgeon: Samara Deist, DPM;  Location: [place];  Service: Podiatry;  Laterality: Left;  . AMPUTATION TOE Left 08/2015   second toe  . CHOLECYSTECTOMY    . HIP ARTHROPLASTY Right 09/11/2019   Procedure: ARTHROPLASTY BIPOLAR HIP (HEMIARTHROPLASTY);  Surgeon: Leim Fabry, MD;  Location: [facility];  Service: Orthopedics;  Laterality: Right;  . LUMBAR DISC SURGERY    . VASCULAR SURGERY Left 2011   x 5 to left leg   HPI:  Pt is a 78 y.o. male with history of GERD, Depression, hypertension, hyperlipidemia, stroke, PVD, here with fall.  The patient states he was in his usual state of health today.  He lives alone.  He is using a walker.  He states he bent down to pick up something off the floor of his house, when he fell forward, striking his right hip.  He does not believe he hit his head.  Reports immediate onset of severe aching, throbbing, right hip pain.  Pt is post-op day 3 s/p ARTHROPLASTY BIPOLAR HIP (HEMIARTHROPLASTY) (Right). Chest CT: Left lower and posterior right upper lobe airspace disease favoring aspiration. Posterior left upper lobe minimal opacity could also represent aspiration or subsegmental atelectasis.  Pt denied any difficulty swallowing at home prior to admission.  He also stated he a "no" desire for  eating/drinking at this time.  Noted a baseline h/o Depression per PCP office visit note in 01/2019; pt declined any anti-depression tx/medication then. Head CT indicated old infarcts including basal ganglia and L cerebellar w/ Moderate parenchymal volume loss and chronic microvascular angiopathy.  Assessment / Plan / Recommendation Clinical Impression  Pt appears to present w/ grossly adequate oropharyngeal phase swallowing function but somewhat impacted by his lacking Lower, bottom Dentition which impacted mastication effort/timing. Given time, and moistening the small boluses of foods well, appeared to aid oral phase management and mastication w/ fairly timely swallow and clearing w/ the trials. Pt needed support for positioning using pillows for comfort and sitting forward to feed self - he stated he felt uncomfortable "all over". NSG aware. After setup and positioning, pt consumed trials of thin liquids via cup and straw, purees and soft solids. Pt declined further after taking the few bites/sips w/ SLP's encouragement stating he had "no" desire for eating/drinking. He does have a baseilne dx of Depression per chart. No immediate, overt clinical s/s of aspiration noted -- no immediate, overt coughing or decline in respiratory effort noted. Vocal quality remained clear b/t trials when assessed. However, a Delayed Cough was noted post multiple sips of thin liquid via straw. Pt was educated on slowing down, using smaller, single sips w/ no further coughing noted after that. No oral motor weakness noted; no unilateral deficits. Speech clear. During the oral phase, pt exhibited adequate bolus management w/ trials and A-P transfer  for swallowing; oral clearing achieved adequate given time w/ the increased textured foods d/t missing bottom Dentition. Pt required setup support, encouragement. Recommend Dysphagia 3 diet w/ gravy to moisten, and thin liquids via Cup as needed (monitoring straw drinking for overt s/s  of aspiration); Pills in Puree for safer, easier swallowing as needed; aspiration precautions; support at meals. ST services will monitor for any further needs next 1-2 days; possible f/u w/ MBSS. Reflux precautions d/t GERD baseline. Pt is at increase risk for micro-aspiration secondary to his declined Neurologic(Apnea) and Pulmonary status'; recent illness and old CVA. However, pt has not had any documneted Pulmonary decline in recent 6+ months per chart notes. Recommend Dietician f/u for nutritional support.  SLP Visit Diagnosis: Dysphagia, oropharyngeal phase (R13.12)    Aspiration Risk  Mild aspiration risk;Risk for inadequate nutrition/hydration    Diet Recommendation  Dysphagia level 3 (mech soft) w/ Thin liquids; strict aspiration precautions; GERD precautions (baseline). Support at meals for setup and positioning upright to feed self. Pt wears Full Dentures - oral care.  Medication Administration: Whole meds with puree    Other  Recommendations Recommended Consults: (Dietician f/u) Oral Care Recommendations: Oral care BID;Staff/trained caregiver to provide oral care Other Recommendations: (n/a at this time)   Follow up Recommendations (TBD)      Frequency and Duration min 3x week  2 weeks       Prognosis Prognosis for Safe Diet Advancement: Fair(-Good) Barriers to Reach Goals: (baseline Illness and deconditioning)      Swallow Study   General Date of Onset: 09/10/19 HPI: Pt is a 78 y.o. male with history of GERD, Depression, hypertension, hyperlipidemia, stroke, PVD, here with fall.  The patient states he was in his usual state of health today.  He lives alone.  He is using a walker.  He states he bent down to pick up something off the floor of his house, when he fell forward, striking his right hip.  He does not believe he hit his head.  Reports immediate onset of severe aching, throbbing, right hip pain.  Pt is post-op day 3 s/p ARTHROPLASTY BIPOLAR HIP (HEMIARTHROPLASTY)  (Right). Chest CT: Left lower and posterior right upper lobe airspace disease favoring aspiration. Posterior left upper lobe minimal opacity could also represent aspiration or subsegmental atelectasis.  Pt denied any difficulty swallowing at home prior to admission.  He also stated he a "no" desire for eating/drinking at this time.  Noted a baseline h/o Depression per PCP office visit note in 01/2019; pt declined any anti-depression tx/medication then.  Type of Study: Bedside Swallow Evaluation Previous Swallow Assessment: none reported Diet Prior to this Study: Regular;Thin liquids Temperature Spikes Noted: No(wbc 10.4) Respiratory Status: Nasal cannula(6-8 L) History of Recent Intubation: No Behavior/Cognition: Alert;Cooperative;Pleasant mood;Distractible;Requires cueing Oral Cavity Assessment: Dry Oral Care Completed by SLP: Yes Oral Cavity - Dentition: Dentures, top(not wearing bottom Denture plate) Vision: Functional for self-feeding Self-Feeding Abilities: Able to feed self;Needs assist;Needs set up Patient Positioning: Upright in bed(needed positioning) Baseline Vocal Quality: Normal Volitional Cough: Strong;Congested(min) Volitional Swallow: Able to elicit    Oral/Motor/Sensory Function Overall Oral Motor/Sensory Function: Within functional limits(no unilateral weakness noted; speech clear)   Ice Chips Ice chips: Within functional limits Presentation: Spoon(fed; 3 trials)   Thin Liquid Thin Liquid: Impaired Presentation: Self Fed;Straw;Cup(3 trials via cup; 6 trials via straw) Oral Phase Impairments: (none) Oral Phase Functional Implications: (none) Pharyngeal  Phase Impairments: Cough - Delayed(x1 w/ straw drinking) Other Comments: educated pt on slowing down taking  single, smaller sips vs multiple sips via straw    Nectar Thick Nectar Thick Liquid: Not tested   Honey Thick Honey Thick Liquid: Not tested   Puree Puree: Within functional limits Presentation: Spoon(fed; 5 trials  accepted)   Solid     Solid: Impaired Presentation: Self Fed(5 trials accepted) Oral Phase Impairments: Impaired mastication(missing lower dentition) Oral Phase Functional Implications: Impaired mastication(min) Pharyngeal Phase Impairments: (none) Other Comments: min extra time needed for mastication/clearing       Alexander Kenner, MS, CCC-SLP Alexander Alvarado 09/14/2019,2:31 PM

## 2019-09-14 NOTE — Progress Notes (Signed)
RN at bedside to assess pt after call from PT where Pt is de-sating. O2 increased to 7 L, Pt sating at 88-92% with apneic breathing at times. Will awaken with a shake. Opens eyes and drifts back to sleep. Dr. Manuella Ghazi called and updated. New order for CPAP when sleeping. Pt is pain medication sensitive. Will continue to monitor.

## 2019-09-14 NOTE — Progress Notes (Signed)
Physical Therapy Treatment Patient Details Name: Alexander Alvarado MRN: [id number] DOB: 04/21/1941 Today's Date: 09/14/2019    History of Present Illness Alexander Alvarado is a 78 y.o. male with medical history significant of CVA with residual left-sided weakness, peripheral vascular disease s/p left foot toe amputation, hypertension hyperlipidemia who presents status post mechanical fall. Now S/P right hip hemiarthroplasty    PT Comments    Pt more alert this PM, but did demonstrate difficulty following one step commands intermittently. Difficulty especially with exercise technique/form. Performed several exercises with HOB elevated, extensive verbal and tactile cues needed as well as some physical assist for RLE. Of note pt on much more oxygen this PM as well (10L). SpO2 monitored, ranged from 88-92%. Further mobility deferred due to difficulty following commands and respiratory status, RN notified. The patient would benefit from further skilled PT intervention to return to PLOF as able.   Follow Up Recommendations  SNF     Equipment Recommendations  Rolling walker with 5" wheels    Recommendations for Other Services       Precautions / Restrictions Precautions Precautions: Posterior Hip Restrictions Weight Bearing Restrictions: Yes RLE Weight Bearing: Weight bearing as tolerated    Mobility  Bed Mobility Overal bed mobility: Needs Assistance             General bed mobility comments: Pt able to lean forward in bed without assistance and maintain position for 2-3 seconds  Transfers                 General transfer comment: deferred  Ambulation/Gait                 Stairs             Wheelchair Mobility    Modified Rankin (Stroke Patients Only)       Balance       Sitting balance - Comments: Deferred. Unable to assess. Pt declines mobility this date.                                    Cognition Arousal/Alertness:  Awake/alert Behavior During Therapy: Flat affect Overall Cognitive Status: No family/caregiver present to determine baseline cognitive functioning                                 General Comments: Pt intermittently able to follow one step commands, max verbal/tactile cues to participate      Exercises Total Joint Exercises Ankle Circles/Pumps: AROM;Both;10 reps Quad Sets: (unable to understand exercise) Short Arc Quad: AAROM;Strengthening;Both;10 reps Heel Slides: AAROM;Strengthening;Both;10 reps Hip ABduction/ADduction: AAROM;Strengthening;Right;10 reps Other Exercises Other Exercises: pt assisted with urinal use    General Comments        Pertinent Vitals/Pain Faces Pain Scale: (Pt endorsed generalized pain, back pain, denies RLE pain)    Home Living                      Prior Function            PT Goals (current goals can now be found in the care plan section) Progress towards PT goals: Not progressing toward goals - comment(limited by mental status/oxygen needs this session)    Frequency    BID      PT Plan Current plan remains appropriate    Co-evaluation  AM-PAC PT "6 Clicks" Mobility   Outcome Measure  Help needed turning from your back to your side while in a flat bed without using bedrails?: A Lot Help needed moving from lying on your back to sitting on the side of a flat bed without using bedrails?: A Lot Help needed moving to and from a bed to a chair (including a wheelchair)?: Total Help needed standing up from a chair using your arms (e.g., wheelchair or bedside chair)?: Total Help needed to walk in hospital room?: Total Help needed climbing 3-5 steps with a railing? : Total 6 Click Score: 8    End of Session Equipment Utilized During Treatment: Oxygen(10L) Activity Tolerance: Patient limited by pain Patient left: in bed;with call bell/phone within reach Nurse Communication: Mobility status PT Visit  Diagnosis: Unsteadiness on feet (R26.81);Muscle weakness (generalized) (M62.81);Difficulty in walking, not elsewhere classified (R26.2);Pain Pain - Right/Left: Right Pain - part of body: Hip     Time: [id number] PT Time Calculation (min) (ACUTE ONLY): 26 min  Charges:  $Therapeutic Exercise: 8-22 mins                     Lieutenant Diego PT, DPT 3:45 PM,09/14/19 [phone]

## 2019-09-14 NOTE — Plan of Care (Signed)
  Problem: Education: Goal: Knowledge of General Education information will improve Description: Including pain rating scale, medication(s)/side effects and non-pharmacologic comfort measures Outcome: Progressing   Problem: Health Behavior/Discharge Planning: Goal: Ability to manage health-related needs will improve Outcome: Progressing   Problem: Clinical Measurements: Goal: Ability to maintain clinical measurements within normal limits will improve Outcome: Progressing Goal: Will remain free from infection Outcome: Progressing Goal: Diagnostic test results will improve Outcome: Progressing Goal: Respiratory complications will improve Outcome: Not Progressing Goal: Cardiovascular complication will be avoided Outcome: Progressing   Problem: Activity: Goal: Risk for activity intolerance will decrease Outcome: Progressing   Problem: Nutrition: Goal: Adequate nutrition will be maintained Outcome: Progressing   Problem: Coping: Goal: Level of anxiety will decrease Outcome: Progressing   Problem: Elimination: Goal: Will not experience complications related to bowel motility Outcome: Progressing Goal: Will not experience complications related to urinary retention Outcome: Progressing

## 2019-09-14 NOTE — Progress Notes (Signed)
Pt refused all meals this shift. Refuses TV. Appears depressed and flat in affect.

## 2019-09-14 NOTE — Care Management Important Message (Signed)
Important Message  Patient Details  Name: NAYTHEN FRARY MRN: [id number] Date of Birth: 10/05/1941   Medicare Important Message Given:  Yes     Juliann Pulse A Renee Beale 09/14/2019, 11:36 AM

## 2019-09-15 LAB — CBC
HCT: 47.1 % (ref 39.0–52.0)
Hemoglobin: 16.5 g/dL (ref 13.0–17.0)
MCH: 31.4 pg (ref 26.0–34.0)
MCHC: 35 g/dL (ref 30.0–36.0)
MCV: 89.7 fL (ref 80.0–100.0)
Platelets: 162 10*3/uL (ref 150–400)
RBC: 5.25 MIL/uL (ref 4.22–5.81)
RDW: 13 % (ref 11.5–15.5)
WBC: 9.7 10*3/uL (ref 4.0–10.5)
nRBC: 0 % (ref 0.0–0.2)

## 2019-09-15 LAB — BASIC METABOLIC PANEL
Anion gap: 14 (ref 5–15)
BUN: 20 mg/dL (ref 8–23)
CO2: 24 mmol/L (ref 22–32)
Calcium: 8.5 mg/dL — ABNORMAL LOW (ref 8.9–10.3)
Chloride: 96 mmol/L — ABNORMAL LOW (ref 98–111)
Creatinine, Ser: 0.62 mg/dL (ref 0.61–1.24)
GFR calc Af Amer: >60 mL/min (ref >60)
GFR calc non Af Amer: >60 mL/min (ref >60)
Glucose, Bld: 81 mg/dL (ref 70–99)
Potassium: 3.5 mmol/L (ref 3.5–5.1)
Sodium: 134 mmol/L — ABNORMAL LOW (ref 135–145)

## 2019-09-15 LAB — SURGICAL PATHOLOGY

## 2019-09-15 LAB — TROPONIN I (HIGH SENSITIVITY)
Troponin I (High Sensitivity): 14 ng/L (ref <18)
Troponin I (High Sensitivity): 14 ng/L (ref <18)

## 2019-09-15 MED ORDER — HYDRALAZINE HCL 25 MG PO TABS
25.0000 mg | ORAL_TABLET | Freq: Two times a day (BID) | ORAL | Status: DC
  Administered 2019-09-15 – 2019-09-16 (×3): 25 mg via ORAL
  Filled 2019-09-15 (×3): qty 1

## 2019-09-15 MED ORDER — IPRATROPIUM-ALBUTEROL 0.5-2.5 (3) MG/3ML IN SOLN
3.0000 mL | Freq: Three times a day (TID) | RESPIRATORY_TRACT | Status: DC
  Administered 2019-09-15 – 2019-09-16 (×3): 3 mL via RESPIRATORY_TRACT
  Filled 2019-09-15 (×3): qty 3

## 2019-09-15 NOTE — Progress Notes (Signed)
Physical Therapy Treatment Patient Details Name: Alexander Alvarado MRN: [id number] DOB: 1941-03-23 Today's Date: 09/15/2019    History of Present Illness Alexander Alvarado is a 78 y.o. male with medical history significant of CVA with residual left-sided weakness, peripheral vascular disease s/p left foot toe amputation, hypertension hyperlipidemia who presents status post mechanical fall. Now S/P right hip hemiarthroplasty    PT Comments    Patient with much improved mentation this AM, much more alert. Pt endorsed generalized pain throughout session, denied pain in his R hip, just stated "in general" and "this is the worst I've ever felt". Session focused on promoting functional mobility. Supine to sit with modA for successful weight shift and achieving balance/midline position. Able to maintain sitting for several minutes and due a few exercises with supervision/CGA. Sit <> stand with RW performed twice with modA. Once in standing pt progressed to minA-CGA. Pt unable to clear feet off of ground and endorsed fatigue, mobility to chair deferred. Pt returned to bed with all needs in reach. The patient would benefit from further skilled PT intervention to maximize mobility, safety, and independence.     Follow Up Recommendations  SNF     Equipment Recommendations  Rolling walker with 5" wheels    Recommendations for Other Services       Precautions / Restrictions Precautions Precautions: Posterior Hip Restrictions Weight Bearing Restrictions: Yes RLE Weight Bearing: Weight bearing as tolerated    Mobility  Bed Mobility Overal bed mobility: Needs Assistance Bed Mobility: Supine to Sit;Sit to Supine     Supine to sit: Mod assist Sit to supine: Mod assist;+2 for safety/equipment      Transfers Overall transfer level: Needs assistance Equipment used: Rolling walker (2 wheeled) Transfers: Sit to/from Stand Sit to Stand: Mod assist;+2 physical assistance             Ambulation/Gait             General Gait Details: Pt with difficulty maintaining standing position. Endorsed pain and weakness. Deferred up to recliner this AM due to fatigue/pt unable to clear feet off of ground   Stairs             Wheelchair Mobility    Modified Rankin (Stroke Patients Only)       Balance Overall balance assessment: Needs assistance Sitting-balance support: Feet supported Sitting balance-[name]: Poor Sitting balance - Comments: Able to sit EOB for several minutes, required bilateral UE support to achieve     Standing balance-[name]: Poor Standing balance comment: reliant on walker                            Cognition Arousal/Alertness: Awake/alert Behavior During Therapy: Flat affect Overall Cognitive Status: No family/caregiver present to determine baseline cognitive functioning                                 General Comments: improved mentation this morning. able to follow all commands, oriented x3 (though got day and date wrong, mostly accurate)      Exercises Total Joint Exercises Ankle Circles/Pumps: AROM;Seated;5 reps Long Arc Quad: AROM;Both;5 reps    General Comments General comments (skin integrity, edema, etc.): on 8 L HFNC this session, spO2 monitoried >94% throughout      Pertinent Vitals/Pain Pain Assessment: Faces Faces Pain Scale: Hurts a little bit Pain Descriptors / Indicators: Grimacing Pain Intervention(s):  Limited activity within patient's tolerance;Monitored during session;Repositioned    Home Living                      Prior Function            PT Goals (current goals can now be found in the care plan section) Progress towards PT goals: Progressing toward goals    Frequency    BID      PT Plan Current plan remains appropriate    Co-evaluation              AM-PAC PT "6 Clicks" Mobility   Outcome Measure  Help needed turning from your back  to your side while in a flat bed without using bedrails?: A Lot Help needed moving from lying on your back to sitting on the side of a flat bed without using bedrails?: A Lot Help needed moving to and from a bed to a chair (including a wheelchair)?: Total Help needed standing up from a chair using your arms (e.g., wheelchair or bedside chair)?: Total Help needed to walk in hospital room?: Total Help needed climbing 3-5 steps with a railing? : Total 6 Click Score: 8    End of Session Equipment Utilized During Treatment: Gait belt(8L) Activity Tolerance: Patient limited by pain Patient left: in bed;with call bell/phone within reach Nurse Communication: Mobility status PT Visit Diagnosis: Unsteadiness on feet (R26.81);Muscle weakness (generalized) (M62.81);Difficulty in walking, not elsewhere classified (R26.2);Pain Pain - Right/Left: Right Pain - part of body: Hip     Time: [id number] PT Time Calculation (min) (ACUTE ONLY): 27 min  Charges:  $Therapeutic Exercise: 23-37 mins                     Lieutenant Diego PT, DPT 9:59 AM,09/15/19 [phone]

## 2019-09-15 NOTE — Progress Notes (Signed)
D: Pt alert and oriented. Pt reports pain and discomfort related to positioning. This writer repositioned pt, no further complaints. Pt OOB w/PT to chair. Pt has been weaned down to 4L O2 during this shift, O2 stats monitored, tolerating well. Pt has c/o being scared and paranoid, reports he's afraid we'll forget him. This Probation officer gave support, reassurance and used therapeutic communication. Pt is calm and cooperative, responded well to support and therapeutic communication. Ordered food for pt and encouragement pt to eat.    A: Scheduled medications administered to pt, per MD orders. Support and encouragement provided. Frequent verbal contact made.   R: No adverse drug reactions noted. Pt complaint with medications and treatment plan. Pt interacts well with staff on the unit. Pt stable at this time, will continue to monitor and provided care for as ordered.   Covid test preformed, awaiting results.

## 2019-09-15 NOTE — Progress Notes (Signed)
  Subjective: 4 Days Post-Op Procedure(s) (LRB): ARTHROPLASTY BIPOLAR HIP (HEMIARTHROPLASTY) (Right) Patient reports pain as moderate.   Patient seen in rounds with Dr. Posey Pronto. Patient is well, and has had no acute complaints or problems.  The patient is having some depression about being in the hospital, but is quite confused. Plan is to go Rehab after hospital stay. Negative for chest pain and shortness of breath Fever: no Gastrointestinal: Negative for nausea and vomiting  Objective: Vital signs in last 24 hours: Temp:  [97.3 F (36.3 C)-98.4 F (36.9 C)] 98.4 F (36.9 C) (11/24 0108) Pulse Rate:  [62-68] 62 (11/24 0108) Resp:  [18-19] 19 (11/24 0108) BP: (141-151)/(63-72) 141/63 (11/24 0108) SpO2:  [92 %-99 %] 99 % (11/24 0108)  Intake/Output from previous day:  Intake/Output Summary (Last 24 hours) at 09/15/2019 0657 Last data filed at 09/14/2019 1118 Gross per 24 hour  Intake 200 ml  Output -  Net 200 ml    Intake/Output this shift: No intake/output data recorded.  Labs: Recent Labs    09/13/19 0554 09/14/19 0538 09/15/19 0529  HGB 16.2 16.6 16.5   Recent Labs    09/14/19 0538 09/15/19 0529  WBC 10.4 9.7  RBC 5.16 5.25  HCT 47.9 47.1  PLT 144* 162   Recent Labs    09/14/19 0538 09/15/19 0529  NA 132* 134*  K 4.5 3.5  CL 96* 96*  CO2 23 24  BUN 23 20  CREATININE 0.61 0.62  GLUCOSE 80 81  CALCIUM 8.3* 8.5*   No results for input(s): LABPT, INR in the last 72 hours.   EXAM General - Patient is Alert and Oriented Extremity - Neurovascular intact Sensation intact distally Dorsiflexion/Plantar flexion intact Compartment soft Dressing/Incision - clean, dry, no drainage Motor Function - intact, moving foot and toes well on exam.   Past Medical History:  Diagnosis Date  . Depression   . GERD (gastroesophageal reflux disease)   . High cholesterol   . Hypertension   . PVD (peripheral vascular disease) ([place])   . Stroke ([place])    left side  weakness    Assessment/Plan: 4 Days Post-Op Procedure(s) (LRB): ARTHROPLASTY BIPOLAR HIP (HEMIARTHROPLASTY) (Right) Active Problems:   Essential (primary) hypertension   Gastro-esophageal reflux disease without esophagitis   Hyperlipidemia, mixed   Insomnia, persistent   Peripheral vascular disease ([facility])   Cerebrovascular accident (CVA) due to occlusion of cerebral artery (HCC)   Amputation of toe of left foot ([place])   Femur fracture, right ([place])  Estimated body mass index is 23.71 kg/m as calculated from the following:   Height as of this encounter: 5\' 11"  (1.803 m).   Weight as of this encounter: 77.1 kg. Advance diet Up with therapy D/C IV fluids  Discharge planning.  Follow-up at [facility] in 2 weeks for staple removal.  DVT Prophylaxis - Lovenox, Foot Pumps and TED hose Weight-Bearing as tolerated to right leg  Reche Dixon, PA-C Orthopaedic Surgery 09/15/2019, 6:57 AM

## 2019-09-15 NOTE — Progress Notes (Signed)
PROGRESS NOTE    Alexander Alvarado  [id number] DOB: Nov 27, 1940 DOA: 09/10/2019 PCP: Glean Hess, MD      Brief Narrative:  Alexander Alvarado is a 78 y.o. M with hx CVA residual left hemiparesis, chronic polycythemia, HTN, smoking, PVD s/p left partial foot amputation and ALF dwelling who presented with mechanical fall and left hip pain.  Patient was bending over when he lost his balance and fell.  No chest pain or loss of consciousness at the time of the fall.  In the ER, x-ray showed right femoral neck fracture.  Orthopedics were consulted and recommended operative repair.         Assessment & Plan:  Acute right femoral neck fracture S/p RIGHT hemiarthroplasty 11/20 by Dr. Posey Pronto Intraoperative course complicated by aspiration into chest causing acute hypoxic respiratory failure.  Creatinine stable, hemoglobin stable. -Consult orthopedics, appreciate cares -PT eval   Acute hypoxic respiratory failure due to aspiration pneumonia Patient with increasing O2 needs overnight 11/21 after surgery.  Report from anesthesia was that the patient noted to have aspirate in the airway at the end of his procedure.    Postop chest x-ray showed new bibasilar opacities, interstitial change consistent with aspiration. Started on ceftriaxone.  He continues to have fairly significant hypoxic respiratory failure requiring 7 8 L of oxygen.  A CT angiogram of the chest ruled out PE, or worsening pneumonia or edema.  There is some consideration that this may be a component of oversedation with opiates.  He also has some emphysema, but this may be contributing. -Continue ceftriaxone -Start scheduled bronchodilators -Aggressive chest PT -Wean oxygen as able  Altered mental status Resolved   Possible hematemesis There was hearsay when the patient had a rapid response 11/21 the patient had coffee ground emesis intraop.   His hemoglobin is remained stable since this was noted.  Has not had any  stools or hematemesis.  I have a low clinical suspicion for a significant GI bleed. -Continue home PPI -FOBT all stool  Cerebrovascular disease secondary prevention Peripheral vascular disease secondary prevention Hypertension Blood pressure elevated -Continue amlodipine, metoprolol -Stop clonidine due to bradycardia -Start hydralazine -Continue quinapril -Continue atorvastatin, aspirin  Bradycardia -Obtain ECG, hsTrop -Hold clonidine -Continue metoprolol   Erythrocytosis This appears to be chronic polycythemia.  Likely secondary polycythemia from smoking.  This does increase his risk of perioperative stroke.  Reasonable to obtain Jak testing. -Follow EPO and JAK level         MDM and disposition: The below labs and imaging reports reviewed and summarized above.  Medication management as above.  The patient was admitted with acute right femoral neck fracture.    Intraoperative course complicated now by aspiration and hypoxic respiratory failure  His hypoxic respiratory failure worsened again, but this appears to be from overmedication with oxycodone.  He mains on 8 L supplemental oxygen this morning.  We need to wean oxygen before he can be discharged home.  See above work-up regarding ECG and troponins.    DVT prophylaxis: SCD Code Status: DO NOT RESUSCITATE Family Communication:     Consultants:   Orthopedics  Procedures:     Antimicrobials:    Ceftriaxone 11/21 >>   Subjective: Still feels "bad all over".  No chest pain.  He has back pain, right leg pain.  No cough, sputum, focal weakness.      Objective: Vitals:   09/14/19 1530 09/14/19 2025 09/15/19 0108 09/15/19 0822  BP: (!) 151/72  (!) 141/63 (!) 184/78  Pulse: 64  62 83  Resp: 18  19 18   Temp: 97.8 F (36.6 C)  98.4 F (36.9 C) 98.4 F (36.9 C)  TempSrc: Oral  Oral Oral  SpO2: 96% 93% 99% 96%  Weight:      Height:        Intake/Output Summary (Last 24 hours) at 09/15/2019  1036 Last data filed at 09/14/2019 1118 Gross per 24 hour  Intake 200 ml  Output -  Net 200 ml   Filed Weights   09/10/19 1517  Weight: 77.1 kg    Examination: General appearance:  adult male, alert and in no acute distress.  Sitting up in bed, appears uncomfortable. HEENT: Anicteric, conjunctiva pink, lids and lashes normal. No nasal deformity, discharge, epistaxis.  Lips moist, there is. OP tacky dry, no oral lesions.   Skin: Warm and dry.  Ruddy.  No suspicious rashes or lesions. Cardiac: RRR, no murmurs appreciated.  JVP normal, no LE edema.    Respiratory: Normal respiratory rate and rhythm.  CTAB without rales.  Diminished right base, no wheezing.   Abdomen: Abdomen soft.  No tenderness palpation or guarding. No ascites, distension, hepatosplenomegaly.   MSK: No deformities or effusions of the large joints of the upper or lower extremities bilaterally. Neuro: Awake and alert. Naming is grossly intact, and the patient's recall, recent and remote, as well as general fund of knowledge seem within normal limits.  Muscle tone diminished, without fasciculations.  Moves upper extremities with generalized weakness, leg strength is very weak and limited by pain.  Speech fluent.    Psych: Sensorium intact and responding to questions, attention normal. Affect anxious.  Judgment and insight appear normal.         Data Reviewed: I have personally reviewed following labs and imaging studies:  CBC: Recent Labs  Lab 09/10/19 1703 09/11/19 0444 09/12/19 0409 09/13/19 0554 09/14/19 0538 09/15/19 0529  WBC 21.9* 17.3* 17.0* 12.9* 10.4 9.7  NEUTROABS 18.2*  --   --   --   --   --   HGB 20.6* 20.0* 16.9 16.2 16.6 16.5  HCT 59.1* 58.7* 48.4 47.7 47.9 47.1  MCV 91.2 92.7 91.5 94.5 92.8 89.7  PLT 171 162 129* 118* 144* 0000000   Basic Metabolic Panel: Recent Labs  Lab 09/11/19 0444 09/12/19 0409 09/13/19 0554 09/14/19 0538 09/15/19 0529  NA 137 135 135 132* 134*  K 4.0 3.5 3.7 4.5  3.5  CL 100 98 97* 96* 96*  CO2 25 26 27 23 24   GLUCOSE 120* 115* 87 80 81  BUN 16 21 24* 23 20  CREATININE 0.76 0.70 0.69 0.61 0.62  CALCIUM 8.9 8.1* 8.5* 8.3* 8.5*   GFR: Estimated Creatinine Clearance: 83.7 mL/min (by C-G formula based on SCr of 0.62 mg/dL). Liver Function Tests: Recent Labs  Lab 09/10/19 1703  AST 32  ALT 19  ALKPHOS 138*  BILITOT 1.5*  PROT 7.6  ALBUMIN 4.3   No results for input(s): LIPASE, AMYLASE in the last 168 hours. No results for input(s): AMMONIA in the last 168 hours. Coagulation Profile: Recent Labs  Lab 09/10/19 1703  INR 1.1   Cardiac Enzymes: No results for input(s): CKTOTAL, CKMB, CKMBINDEX, TROPONINI in the last 168 hours. BNP (last 3 results) No results for input(s): PROBNP in the last 8760 hours. HbA1C: No results for input(s): HGBA1C in the last 72 hours. CBG: No results for input(s): GLUCAP in the last 168 hours. Lipid Profile: No results for input(s): CHOL, HDL, LDLCALC,  TRIG, CHOLHDL, LDLDIRECT in the last 72 hours. Thyroid Function Tests: No results for input(s): TSH, T4TOTAL, FREET4, T3FREE, THYROIDAB in the last 72 hours. Anemia Panel: No results for input(s): VITAMINB12, FOLATE, FERRITIN, TIBC, IRON, RETICCTPCT in the last 72 hours. Urine analysis:    Component Value Date/Time   COLORURINE Yellow 07/14/2012 2232   APPEARANCEUR Hazy 07/14/2012 2232   LABSPEC 1.024 07/14/2012 2232   PHURINE 6.0 07/14/2012 2232   GLUCOSEU Negative 07/14/2012 2232   HGBUR Negative 07/14/2012 2232   BILIRUBINUR neg 02/10/2019 0847   BILIRUBINUR Negative 07/14/2012 2232   KETONESUR 1+ 07/14/2012 2232   PROTEINUR Negative 02/10/2019 0847   PROTEINUR Negative 07/14/2012 2232   UROBILINOGEN 0.2 02/10/2019 0847   NITRITE neg 02/10/2019 0847   NITRITE Negative 07/14/2012 2232   LEUKOCYTESUR Negative 02/10/2019 0847   LEUKOCYTESUR Negative 07/14/2012 2232   Sepsis Labs: @LABRCNTIP (procalcitonin:4,lacticacidven:4)  ) Recent Results  (from the past 240 hour(s))  SARS CORONAVIRUS 2 (TAT 6-24 HRS) Nasopharyngeal Nasopharyngeal Swab     Status: None   Collection Time: 09/10/19  5:13 PM   Specimen: Nasopharyngeal Swab  Result Value Ref Range Status   SARS Coronavirus 2 NEGATIVE NEGATIVE Final    Comment: (NOTE) SARS-CoV-2 target nucleic acids are NOT DETECTED. The SARS-CoV-2 RNA is generally detectable in upper and lower respiratory specimens during the acute phase of infection. Negative results do not preclude SARS-CoV-2 infection, do not rule out co-infections with other pathogens, and should not be used as the sole basis for treatment or other patient management decisions. Negative results must be combined with clinical observations, patient history, and epidemiological information. The expected result is Negative. Fact Sheet for Patients: SugarRoll.be Fact Sheet for Healthcare Providers: https://www.woods-mathews.com/ This test is not yet approved or cleared by the [place] FDA and  has been authorized for detection and/or diagnosis of SARS-CoV-2 by FDA under an Emergency Use Authorization (EUA). This EUA will remain  in effect (meaning this test can be used) for the duration of the COVID-19 declaration under Section 56 4(b)(1) of the Act, 21 U.S.C. section 360bbb-3(b)(1), unless the authorization is terminated or revoked sooner. Performed at [facility], [place] [address], [place], [place] [address]          Radiology Studies: No results found.      Scheduled Meds: . acetaminophen  1,000 mg Oral TID  . amLODipine  10 mg Oral Daily  . aspirin EC  81 mg Oral Daily  . atorvastatin  20 mg Oral QHS  . cloNIDine  0.1 mg Oral BID  . docusate sodium  100 mg Oral BID  . enoxaparin (LOVENOX) injection  40 mg Subcutaneous Q24H  . folic acid  1 mg Oral Daily  . influenza vaccine adjuvanted  0.5 mL Intramuscular Tomorrow-1000  . metoprolol tartrate  25 mg  Oral BID  . nicotine  14 mg Transdermal Daily  . pantoprazole  40 mg Oral Daily  . quinapril  40 mg Oral Daily  . cyanocobalamin  1,000 mcg Oral Daily   Continuous Infusions: . cefTRIAXone (ROCEPHIN)  IV Stopped (09/14/19 1005)     LOS: 5 days    Time spent: 25 minutes    Edwin Dada, MD [facility] 09/15/2019, 10:36 AM     Please page though [place] or Epic secure chat:  For [employer], Adult nurse

## 2019-09-15 NOTE — Progress Notes (Signed)
Physical Therapy Treatment Patient Details Name: Alexander Alvarado MRN: [id number] DOB: 25-Dec-1940 Today's Date: 09/15/2019    History of Present Illness Alexander Alvarado is a 78 y.o. male with medical history significant of CVA with residual left-sided weakness, peripheral vascular disease s/p left foot toe amputation, hypertension hyperlipidemia who presents status post mechanical fall. Now S/P right hip hemiarthroplasty    PT Comments    Pt is a pleasant 78 year old M who was admitted for above diagnosis. Pt performs bed mobility with mod A, and transfers mod A+2 for safety. Pt reporting high pain levels at start of session and reporting general unsteadiness on feet, requiring mod A +2 for safety and frequent encouragement to perform mobility. Pt requiring mod A to perform supine>sit for trunk management, and assistance to scoot edge of bed using chuck pad. Pt requiring mod A +2 with sit>stand transfer for safety demonstrating heavy reliance on RW with UEs, and requiring x3 attempts prior to attempting stand pivot transfer. Pt is unable to verbalize hip precautions; reiterated precautions throughout session with pt with poor carryover. Pt demonstrates deficits with balance, strength and pain, requiring continued skilled PT intervention to improve functional mobility.   Follow Up Recommendations  SNF     Equipment Recommendations  Rolling walker with 5" wheels    Recommendations for Other Services       Precautions / Restrictions Precautions Precautions: Posterior Hip;Fall Precaution Comments: Reiterated pt's post hip precautions, with patient receptive but with poor carryover Restrictions Weight Bearing Restrictions: Yes RLE Weight Bearing: Weight bearing as tolerated    Mobility  Bed Mobility Overal bed mobility: Needs Assistance Bed Mobility: Supine to Sit     Supine to sit: Mod assist     General bed mobility comments: for trunk management; once sitting edge of bed,  assisted in scooting forward with use of chuck pad  Transfers Overall transfer level: Needs assistance Equipment used: Rolling walker (2 wheeled) Transfers: Sit to/from [facility] Sit to Stand: Mod assist;+2 physical assistance Stand pivot transfers: Mod assist;+2 safety/equipment       General transfer comment: Able to perform stand pivot transfer from bedside to chair, with manual assistance for RW management and verbal cuing for sequencing/foot placement. Performs STS x3, requiring some encouragement to attempt to transfer to chair, reporting feeling unsteady.  Ambulation/Gait                 Stairs             Wheelchair Mobility    Modified Rankin (Stroke Patients Only)       Balance Overall balance assessment: Needs assistance Sitting-balance support: Feet supported Sitting balance-[name]: Fair Sitting balance - Comments: Able to sit EOB steady with hands in lap with close supervision.   Standing balance support: Bilateral upper extremity supported Standing balance-[name]: Poor Standing balance comment: reliant on walker                            Cognition Arousal/Alertness: Awake/alert Behavior During Therapy: WFL for tasks assessed/performed                                   General Comments: Pt is pleasant and able to follow all commands, oriented x3 (though got date wrong, mostly accurate)      Exercises Total Joint Exercises Ankle Circles/Pumps: AROM;Supine;Both;10 reps Quad Sets:  Strengthening;Right;10 reps Marching in Standing: AROM;Both;10 reps    General Comments General comments (skin integrity, edema, etc.): on 6L O2 this session; O2 remaining >94% throughout; pt able to perform STS x3 and standing marching though with mod A for balance assistance/safety and requiring encouragement to transfer to chair.      Pertinent Vitals/Pain Pain Score: 10-Worst pain ever("intense  pain") Pain Location: R hip Pain Descriptors / Indicators: Constant;Aching("intense") Pain Intervention(s): Limited activity within patient's tolerance;Monitored during session;Repositioned    Home Living                      Prior Function            PT Goals (current goals can now be found in the care plan section) Acute Rehab PT Goals Patient Stated Goal: To get stronger and have less pain PT Goal Formulation: With patient Time For Goal Achievement: 09/26/19 Potential to Achieve Goals: Good Progress towards PT goals: Progressing toward goals    Frequency    BID      PT Plan Current plan remains appropriate    Co-evaluation              AM-PAC PT "6 Clicks" Mobility   Outcome Measure  Help needed turning from your back to your side while in a flat bed without using bedrails?: A Little Help needed moving from lying on your back to sitting on the side of a flat bed without using bedrails?: A Lot Help needed moving to and from a bed to a chair (including a wheelchair)?: A Lot Help needed standing up from a chair using your arms (e.g., wheelchair or bedside chair)?: A Lot Help needed to walk in hospital room?: A Lot Help needed climbing 3-5 steps with a railing? : Total 6 Click Score: 12    End of Session Equipment Utilized During Treatment: Gait belt;Oxygen Activity Tolerance: Patient limited by pain;Patient limited by fatigue Patient left: in chair;with call bell/phone within reach;with chair alarm set;with SCD's reapplied Nurse Communication: Mobility status PT Visit Diagnosis: Unsteadiness on feet (R26.81);Muscle weakness (generalized) (M62.81);Difficulty in walking, not elsewhere classified (R26.2);Pain Pain - Right/Left: Right Pain - part of body: Hip     Time: [id number] PT Time Calculation (min) (ACUTE ONLY): 28 min  Charges:  $Therapeutic Exercise: 8-22 mins $Therapeutic Activity: 8-22 mins                    Petra Kuba, PT,  DPT 09/15/19, 4:18 PM

## 2019-09-15 NOTE — Progress Notes (Signed)
Occupational Therapy Treatment Patient Details Name: Alexander Alvarado MRN: [id number] DOB: 08-09-1941 Today's Date: 09/15/2019    History of present illness Alexander Alvarado is a 78 y.o. male with medical history significant of CVA with residual left-sided weakness, peripheral vascular disease s/p left foot toe amputation, hypertension hyperlipidemia who presents status post mechanical fall. Now S/P right hip hemiarthroplasty   OT comments  Pt agreeable to OT this session and currently limited to bed level ADLs due to labored breathing and on 8L of O2. Pain level 10/10 but agreeable to therapy. Reviewed post hip precautions and was not able to recall any of them. Discussed need for AD for LB dressing and bathing with demonstration.  Mod cues and assist for LB dressing skills using reacher to remove socks while in bed and demonstration of sock aid with practice holding handle of sock aid with L hand with hx of weakness from hx of CVA. Continue ADL training while observe posterior hip precautions.   Follow Up Recommendations  SNF    Equipment Recommendations  3 in 1 bedside commode    Recommendations for Other Services      Precautions / Restrictions Precautions Precautions: Posterior Hip Restrictions Weight Bearing Restrictions: Yes RLE Weight Bearing: Weight bearing as tolerated       Mobility Bed Mobility Overal bed mobility: Needs Assistance Bed Mobility: Supine to Sit;Sit to Supine     Supine to sit: Mod assist Sit to supine: Mod assist;+2 for safety/equipment      Transfers Overall transfer level: Needs assistance Equipment used: Rolling walker (2 wheeled) Transfers: Sit to/from Stand Sit to Stand: Mod assist;+2 physical assistance              Balance Overall balance assessment: Needs assistance Sitting-balance support: Feet supported Sitting balance-[name]: Poor Sitting balance - Comments: Able to sit EOB for several minutes, required bilateral UE  support to achieve     Standing balance-[name]: Poor Standing balance comment: reliant on walker                           ADL either performed or assessed with clinical judgement   ADL Overall ADL's : Needs assistance/impaired                     Lower Body Dressing: Set up;Maximal assistance;Adhering to hip precautions;With adaptive equipment Lower Body Dressing Details (indicate cue type and reason): Pt agreeable to OT this session and currently limited to bed level ADLs due to labored breathing and on 8L of O2.  Mod cues and assist for LB dressing skills using reacher to remove socks while in bed and demonstration of sock aid with practice holding handle of sock aid with L hand with hx of weakness.                     Vision Baseline Vision/History: Wears glasses Wears Glasses: At all times Patient Visual Report: No change from baseline     Perception     Praxis      Cognition Arousal/Alertness: Awake/alert Behavior During Therapy: Flat affect Overall Cognitive Status: No family/caregiver present to determine baseline cognitive functioning                                 General Comments: improved mentation this morning. able to follow all commands, oriented x3 (though got day  and date wrong, mostly accurate)        Exercises Total Joint Exercises Ankle Circles/Pumps: AROM;Seated;5 reps Long Arc Quad: AROM;Both;5 reps   Shoulder Instructions       General Comments on 8 L HFNC this session, spO2 monitoried >94% throughout    Pertinent Vitals/ Pain       Pain Assessment: 0-10 Pain Score: 10-Worst pain ever Faces Pain Scale: Hurts a little bit Pain Location: R hip Pain Descriptors / Indicators: Constant;Aching Pain Intervention(s): Limited activity within patient's tolerance;Monitored during session;Repositioned;Premedicated before session  Home Living                                           Prior Functioning/Environment              Frequency  Min 2X/week        Progress Toward Goals  OT Goals(current goals can now be found in the care plan section)  Progress towards OT goals: Progressing toward goals  Acute Rehab OT Goals Patient Stated Goal: To get stronger and have less pain OT Goal Formulation: With patient Time For Goal Achievement: 09/26/19 Potential to Achieve Goals: Good  Plan Discharge plan remains appropriate    Co-evaluation                 AM-PAC OT "6 Clicks" Daily Activity     Outcome Measure   Help from another person eating meals?: A Little Help from another person taking care of personal grooming?: A Little Help from another person toileting, which includes using toliet, bedpan, or urinal?: Total Help from another person bathing (including washing, rinsing, drying)?: Total Help from another person to put on and taking off regular upper body clothing?: A Lot Help from another person to put on and taking off regular lower body clothing?: A Lot 6 Click Score: 12    End of Session    OT Visit Diagnosis: Other abnormalities of gait and mobility (R26.89);History of falling (Z91.81);Pain Pain - Right/Left: Right Pain - part of body: Hip;Leg   Activity Tolerance Patient limited by pain   Patient Left in bed;with call bell/phone within reach;with bed alarm set;with nursing/sitter in room   Nurse Communication          Time: 1020-1045 OT Time Calculation (min): 25 min  Charges: OT General Charges $OT Visit: 1 Visit OT Treatments $Self Care/Home Management : 23-37 mins  Chrys Racer, OTR/L, [place] [employer] [phone] 09/15/19, 10:58 AM

## 2019-09-16 DIAGNOSIS — M6281 Muscle weakness (generalized): Secondary | ICD-10-CM | POA: Diagnosis not present

## 2019-09-16 DIAGNOSIS — S72001D Fracture of unspecified part of neck of right femur, subsequent encounter for closed fracture with routine healing: Secondary | ICD-10-CM | POA: Diagnosis not present

## 2019-09-16 DIAGNOSIS — J690 Pneumonitis due to inhalation of food and vomit: Secondary | ICD-10-CM

## 2019-09-16 DIAGNOSIS — E785 Hyperlipidemia, unspecified: Secondary | ICD-10-CM | POA: Diagnosis not present

## 2019-09-16 DIAGNOSIS — S7291XD Unspecified fracture of right femur, subsequent encounter for closed fracture with routine healing: Secondary | ICD-10-CM | POA: Diagnosis not present

## 2019-09-16 DIAGNOSIS — W010XXA Fall on same level from slipping, tripping and stumbling without subsequent striking against object, initial encounter: Secondary | ICD-10-CM | POA: Diagnosis not present

## 2019-09-16 DIAGNOSIS — Z7401 Bed confinement status: Secondary | ICD-10-CM | POA: Diagnosis not present

## 2019-09-16 DIAGNOSIS — K219 Gastro-esophageal reflux disease without esophagitis: Secondary | ICD-10-CM | POA: Diagnosis not present

## 2019-09-16 DIAGNOSIS — R000 Tachycardia, unspecified: Secondary | ICD-10-CM | POA: Diagnosis not present

## 2019-09-16 DIAGNOSIS — R262 Difficulty in walking, not elsewhere classified: Secondary | ICD-10-CM | POA: Diagnosis not present

## 2019-09-16 DIAGNOSIS — Z23 Encounter for immunization: Secondary | ICD-10-CM | POA: Diagnosis not present

## 2019-09-16 DIAGNOSIS — Z8673 Personal history of transient ischemic attack (TIA), and cerebral infarction without residual deficits: Secondary | ICD-10-CM | POA: Diagnosis not present

## 2019-09-16 DIAGNOSIS — R1312 Dysphagia, oropharyngeal phase: Secondary | ICD-10-CM | POA: Diagnosis not present

## 2019-09-16 DIAGNOSIS — Z9981 Dependence on supplemental oxygen: Secondary | ICD-10-CM | POA: Diagnosis not present

## 2019-09-16 DIAGNOSIS — E782 Mixed hyperlipidemia: Secondary | ICD-10-CM | POA: Diagnosis not present

## 2019-09-16 DIAGNOSIS — S72001S Fracture of unspecified part of neck of right femur, sequela: Secondary | ICD-10-CM

## 2019-09-16 DIAGNOSIS — J9601 Acute respiratory failure with hypoxia: Secondary | ICD-10-CM | POA: Diagnosis not present

## 2019-09-16 DIAGNOSIS — J9621 Acute and chronic respiratory failure with hypoxia: Secondary | ICD-10-CM

## 2019-09-16 DIAGNOSIS — Z7901 Long term (current) use of anticoagulants: Secondary | ICD-10-CM | POA: Diagnosis not present

## 2019-09-16 DIAGNOSIS — Z4789 Encounter for other orthopedic aftercare: Secondary | ICD-10-CM | POA: Diagnosis not present

## 2019-09-16 DIAGNOSIS — R488 Other symbolic dysfunctions: Secondary | ICD-10-CM | POA: Diagnosis not present

## 2019-09-16 DIAGNOSIS — I10 Essential (primary) hypertension: Secondary | ICD-10-CM

## 2019-09-16 DIAGNOSIS — M2550 Pain in unspecified joint: Secondary | ICD-10-CM | POA: Diagnosis not present

## 2019-09-16 DIAGNOSIS — R498 Other voice and resonance disorders: Secondary | ICD-10-CM | POA: Diagnosis not present

## 2019-09-16 DIAGNOSIS — I739 Peripheral vascular disease, unspecified: Secondary | ICD-10-CM | POA: Diagnosis not present

## 2019-09-16 DIAGNOSIS — M9701XA Periprosthetic fracture around internal prosthetic right hip joint, initial encounter: Secondary | ICD-10-CM | POA: Diagnosis not present

## 2019-09-16 DIAGNOSIS — I2510 Atherosclerotic heart disease of native coronary artery without angina pectoris: Secondary | ICD-10-CM | POA: Diagnosis not present

## 2019-09-16 DIAGNOSIS — S72009D Fracture of unspecified part of neck of unspecified femur, subsequent encounter for closed fracture with routine healing: Secondary | ICD-10-CM | POA: Diagnosis not present

## 2019-09-16 DIAGNOSIS — Y92009 Unspecified place in unspecified non-institutional (private) residence as the place of occurrence of the external cause: Secondary | ICD-10-CM | POA: Diagnosis not present

## 2019-09-16 DIAGNOSIS — G4700 Insomnia, unspecified: Secondary | ICD-10-CM | POA: Diagnosis not present

## 2019-09-16 DIAGNOSIS — D649 Anemia, unspecified: Secondary | ICD-10-CM | POA: Diagnosis not present

## 2019-09-16 LAB — CBC
HCT: 47.1 % (ref 39.0–52.0)
Hemoglobin: 17.3 g/dL — ABNORMAL HIGH (ref 13.0–17.0)
MCH: 32.1 pg (ref 26.0–34.0)
MCHC: 36.7 g/dL — ABNORMAL HIGH (ref 30.0–36.0)
MCV: 87.4 fL (ref 80.0–100.0)
Platelets: 193 10*3/uL (ref 150–400)
RBC: 5.39 MIL/uL (ref 4.22–5.81)
RDW: 12.9 % (ref 11.5–15.5)
WBC: 11.7 10*3/uL — ABNORMAL HIGH (ref 4.0–10.5)
nRBC: 0 % (ref 0.0–0.2)

## 2019-09-16 LAB — COMPREHENSIVE METABOLIC PANEL
ALT: 25 U/L (ref 0–44)
AST: 41 U/L (ref 15–41)
Albumin: 3.4 g/dL — ABNORMAL LOW (ref 3.5–5.0)
Alkaline Phosphatase: 76 U/L (ref 38–126)
Anion gap: 12 (ref 5–15)
BUN: 15 mg/dL (ref 8–23)
CO2: 24 mmol/L (ref 22–32)
Calcium: 8.6 mg/dL — ABNORMAL LOW (ref 8.9–10.3)
Chloride: 97 mmol/L — ABNORMAL LOW (ref 98–111)
Creatinine, Ser: 0.55 mg/dL — ABNORMAL LOW (ref 0.61–1.24)
GFR calc Af Amer: >60 mL/min (ref >60)
GFR calc non Af Amer: >60 mL/min (ref >60)
Glucose, Bld: 118 mg/dL — ABNORMAL HIGH (ref 70–99)
Potassium: 3.2 mmol/L — ABNORMAL LOW (ref 3.5–5.1)
Sodium: 133 mmol/L — ABNORMAL LOW (ref 135–145)
Total Bilirubin: 1.7 mg/dL — ABNORMAL HIGH (ref 0.3–1.2)
Total Protein: 6.7 g/dL (ref 6.5–8.1)

## 2019-09-16 LAB — SARS CORONAVIRUS 2 (TAT 6-24 HRS): SARS Coronavirus 2: NEGATIVE

## 2019-09-16 MED ORDER — METOPROLOL TARTRATE 25 MG PO TABS: 25.0000 mg | ORAL_TABLET | Freq: Three times a day (TID) | ORAL | 0 refills | Status: DC

## 2019-09-16 MED ORDER — NICOTINE 14 MG/24HR TD PT24: 14.0000 mg | MEDICATED_PATCH | Freq: Every day | TRANSDERMAL | 0 refills | Status: DC

## 2019-09-16 MED ORDER — METOPROLOL TARTRATE 25 MG PO TABS
25.0000 mg | ORAL_TABLET | Freq: Three times a day (TID) | ORAL | Status: DC
  Administered 2019-09-16 (×2): 25 mg via ORAL
  Filled 2019-09-16 (×2): qty 1

## 2019-09-16 MED ORDER — HYDRALAZINE HCL 25 MG PO TABS: 25.0000 mg | ORAL_TABLET | Freq: Two times a day (BID) | ORAL | 0 refills | Status: DC

## 2019-09-16 MED ORDER — IPRATROPIUM-ALBUTEROL 0.5-2.5 (3) MG/3ML IN SOLN: 3.0000 mL | RESPIRATORY_TRACT | Status: DC | PRN

## 2019-09-16 MED ORDER — DOCUSATE SODIUM 100 MG PO CAPS: 100.0000 mg | ORAL_CAPSULE | Freq: Two times a day (BID) | ORAL | 0 refills | Status: DC

## 2019-09-16 MED ORDER — ACETAMINOPHEN 325 MG PO TABS: 650.0000 mg | ORAL_TABLET | Freq: Four times a day (QID) | ORAL | 2 refills | Status: AC | PRN

## 2019-09-16 NOTE — Progress Notes (Signed)
Physical Therapy Treatment Patient Details Name: Alexander Alvarado MRN: [id number] DOB: 07/25/1941 Today's Date: 09/16/2019    History of Present Illness Alexander Alvarado is a 78 y.o. male with medical history significant of CVA with residual left-sided weakness, peripheral vascular disease s/p left foot toe amputation, hypertension hyperlipidemia who presents status post mechanical fall. Now S/P right hip hemiarthroplasty    PT Comments    Awake and ready for session.  Participated in exercises as described below.  To edge of bed with mod a x 1.  Sitting with supervision today.  Stood with mod a x 1 with RW at bedside.  Post lean which increased with stepping in place.  Unable to transfer safely with RW and +1 assist so pt sat and stand pivot to recliner used.  After seated, he asked to use commode.  Transferred with stand pivot to and from commode with mod a x 1.  RN in to assist with care.  No BM noted and condom cath did fall off while on commode.    Follow Up Recommendations  SNF     Equipment Recommendations  Rolling walker with 5" wheels    Recommendations for Other Services       Precautions / Restrictions Precautions Precautions: Posterior Hip Restrictions Weight Bearing Restrictions: Yes RLE Weight Bearing: Weight bearing as tolerated    Mobility  Bed Mobility Overal bed mobility: Needs Assistance Bed Mobility: Supine to Sit     Supine to sit: Mod assist        Transfers Overall transfer level: Needs assistance Equipment used: Rolling walker (2 wheeled);None Transfers: Sit to/from [facility] Sit to Stand: Mod assist Stand pivot transfers: Mod assist       General transfer comment: stood with RW and bedside - post lean noted and unable to correct for safet transfer with +1 and RW.  Stepping in place increased post lean.  +2 unavailable at the time so stand pivot and to/from commode used.  Ambulation/Gait                 Stairs              Wheelchair Mobility    Modified Rankin (Stroke Patients Only)       Balance Overall balance assessment: Needs assistance Sitting-balance support: Feet supported Sitting balance-[name]: Fair Sitting balance - Comments: Able to sit EOB steady with hands in lap with close supervision.   Standing balance support: Bilateral upper extremity supported Standing balance-[name]: Poor Standing balance comment: reliant on walker                            Cognition Arousal/Alertness: Awake/alert Behavior During Therapy: WFL for tasks assessed/performed Overall Cognitive Status: Within Functional Limits for tasks assessed                                        Exercises Total Joint Exercises Ankle Circles/Pumps: AROM;Both;10 reps Quad Sets: (unable to understand exercise) Short Arc Quad: AAROM;Strengthening;Both;10 reps Heel Slides: AAROM;Strengthening;Both;10 reps Hip ABduction/ADduction: AAROM;Strengthening;Right;10 reps    General Comments        Pertinent Vitals/Pain Pain Assessment: Faces Faces Pain Scale: Hurts a little bit Pain Location: R hip Pain Descriptors / Indicators: Constant;Aching Pain Intervention(s): Limited activity within patient's tolerance;Monitored during session;Repositioned    Home Living  Prior Function            PT Goals (current goals can now be found in the care plan section) Progress towards PT goals: Progressing toward goals    Frequency    BID      PT Plan Current plan remains appropriate    Co-evaluation              AM-PAC PT "6 Clicks" Mobility   Outcome Measure  Help needed turning from your back to your side while in a flat bed without using bedrails?: A Little Help needed moving from lying on your back to sitting on the side of a flat bed without using bedrails?: A Lot Help needed moving to and from a bed to a chair (including a  wheelchair)?: A Lot Help needed standing up from a chair using your arms (e.g., wheelchair or bedside chair)?: A Lot Help needed to walk in hospital room?: A Lot Help needed climbing 3-5 steps with a railing? : Total 6 Click Score: 12    End of Session Equipment Utilized During Treatment: Gait belt Activity Tolerance: Patient tolerated treatment well Patient left: in chair;with call bell/phone within reach;with chair alarm set Nurse Communication: Mobility status Pain - Right/Left: Right Pain - part of body: Hip     Time: [id number] PT Time Calculation (min) (ACUTE ONLY): 23 min  Charges:  $Therapeutic Exercise: 8-22 mins $Therapeutic Activity: 8-22 mins                    Chesley Noon, PTA 09/16/19, 10:25 AM

## 2019-09-16 NOTE — TOC Transition Note (Signed)
Transition of Care [facility]) - CM/SW Discharge Note   Patient Details  Name: Alexander Alvarado MRN: [id number] Date of Birth: 01-14-1941  Transition of Care [facility]) CM/SW Contact:  Su Hilt, RN Phone Number: 09/16/2019, 10:27 AM   Clinical Narrative:    [facility] 803 Son Ed is aware Bedside nurse to call report to [facility] and call EMS to transport once ready   Final next level of care: Skilled Nursing Facility Barriers to Discharge: Barriers Resolved   Patient Goals and CMS Choice Patient states their goals for this hospitalization and ongoing recovery are:: I want to be able to move      Discharge Placement                       Discharge Plan and Services In-house Referral: Clinical Social Work   Post Acute Care Choice: [place]                               Social Determinants of Health (SDOH) Interventions     Readmission Risk Interventions No flowsheet data found.

## 2019-09-16 NOTE — Discharge Summary (Signed)
[place] [facility] [place] NAME: Alexander Alvarado    MR#:  [id number]  DATE OF BIRTH:  [birth year]  DATE OF ADMISSION:  09/10/2019 ADMITTING PHYSICIAN: Orene Desanctis, DO  DATE OF DISCHARGE: 09/16/2019  PRIMARY CARE PHYSICIAN: Glean Hess, MD    ADMISSION DIAGNOSIS:  Hip fracture ([place]) [S72.009A] Fracture of unspecified part of neck of right femur, initial encounter for closed fracture ([place]) [S72.001A] Leukocytosis, unspecified type [D72.829]  DISCHARGE DIAGNOSIS:  Active Problems:   Essential (primary) hypertension   Gastro-esophageal reflux disease without esophagitis   Hyperlipidemia, mixed   Insomnia, persistent   Peripheral vascular disease ([place])   Cerebrovascular accident (CVA) due to occlusion of cerebral artery ([place])   Amputation of toe of left foot ([place])   Femur fracture, right ([place])   SECONDARY DIAGNOSIS:   Past Medical History:  Diagnosis Date  . Depression   . GERD (gastroesophageal reflux disease)   . High cholesterol   . Hypertension   . PVD (peripheral vascular disease) ([place])   . Stroke [facility])    left side weakness    HOSPITAL COURSE:   1.  Acute right femoral neck fracture requiring operative repair.  Had right hemiarthroplasty on 09/11/19 by Dr. Posey Pronto.  Pain control.  Patient will go out to rehab.  Follow-up with orthopedics for staple removal.  Pain medications and Lovenox prescription written by orthopedics.  Try to get to Tylenol as quick as possible.  Careful with narcotics. 2.  Acute hypoxic respiratory failure secondary to aspiration pneumonia.  Patient was on high flow nasal cannula during the hospital course.  Patient was given Rocephin for pneumonia and will receive the last dose today prior to discharge.  Patient currently this morning is on room air.  Can give oxygen as needed for pulse ox less than 88%.  Continue incentive spirometer. 3.  Sinus tachycardia.  Increase metoprolol dose to 25 mg 3 times  daily. 4.  Essential hypertension on amlodipine, quinapril, metoprolol and hydralazine. 5.  Hyperlipidemia on atorvastatin 6.  Chronic polycythemia.  Follow-up Epogen level and JA K level.  Likely secondary to smoking. 7.  Prior history of ischemic stroke with left-sided weakness on aspirin and atorvastatin 8.  GERD on PPI   DISCHARGE CONDITIONS:   Satisfactory  CONSULTS OBTAINED:  Treatment Team:  Leim Fabry, MD  DRUG ALLERGIES:  No Known Allergies  DISCHARGE MEDICATIONS:   Allergies as of 09/16/2019   No Known Allergies     Medication List    STOP taking these medications   cloNIDine 0.1 MG tablet Commonly known as: CATAPRES   meloxicam 7.5 MG tablet Commonly known as: MOBIC     TAKE these medications   acetaminophen 325 MG tablet Commonly known as: Tylenol Take 2 tablets (650 mg total) by mouth every 6 (six) hours as needed for mild pain.   amLODipine 10 MG tablet Commonly known as: NORVASC Take 1 tablet (10 mg total) by mouth daily.   aspirin 81 MG tablet Take 81 mg by mouth daily.   atorvastatin 20 MG tablet Commonly known as: LIPITOR Take 1 tablet (20 mg total) by mouth at bedtime.   cyanocobalamin 1000 MCG tablet Take 1,000 mcg by mouth daily.   docusate sodium 100 MG capsule Commonly known as: COLACE Take 1 capsule (100 mg total) by mouth 2 (two) times daily.   enoxaparin 40 MG/0.4ML injection Commonly known as: LOVENOX Inject 0.4 mLs (40 mg total) into the skin daily for 14 days.  folic acid 1 MG tablet Commonly known as: FOLVITE Take 1 mg by mouth daily.   hydrALAZINE 25 MG tablet Commonly known as: APRESOLINE Take 1 tablet (25 mg total) by mouth 2 (two) times daily.   metoprolol tartrate 25 MG tablet Commonly known as: LOPRESSOR Take 1 tablet (25 mg total) by mouth 3 (three) times daily after meals. What changed: when to take this   nicotine 14 mg/24hr patch Commonly known as: NICODERM CQ - dosed in mg/24 hours Place 1 patch  (14 mg total) onto the skin daily.   omeprazole 40 MG capsule Commonly known as: PRILOSEC Take 1 capsule (40 mg total) by mouth daily.   oxyCODONE 5 MG immediate release tablet Commonly known as: Oxy IR/ROXICODONE Take 1 tablet (5 mg total) by mouth every 3 (three) hours as needed for moderate pain (pain score 4-6).   quinapril 40 MG tablet Commonly known as: ACCUPRIL Take 1 tablet (40 mg total) by mouth daily.   traMADol 50 MG tablet Commonly known as: ULTRAM Take 1 tablet (50 mg total) by mouth every 6 (six) hours as needed for moderate pain.   traZODone 50 MG tablet Commonly known as: DESYREL Take 1-2 tablets (50-100 mg total) by mouth at bedtime.        DISCHARGE INSTRUCTIONS:   Follow-up Dr. Ernst Spell 1 day Follow-up orthopedic surgery for staple removal  If you experience worsening of your admission symptoms, develop shortness of breath, life threatening emergency, suicidal or homicidal thoughts you must seek medical attention immediately by calling 911 or calling your MD immediately  if symptoms less severe.  You Must read complete instructions/literature along with all the possible adverse reactions/side effects for all the Medicines you take and that have been prescribed to you. Take any new Medicines after you have completely understood and accept all the possible adverse reactions/side effects.   Please note  You were cared for by a hospitalist during your hospital stay. If you have any questions about your discharge medications or the care you received while you were in the hospital after you are discharged, you can call the unit and asked to speak with the hospitalist on call if the hospitalist that took care of you is not available. Once you are discharged, your primary care physician will handle any further medical issues. Please note that NO REFILLS for any discharge medications will be authorized once you are discharged, as it is imperative that you return to your  primary care physician (or establish a relationship with a primary care physician if you do not have one) for your aftercare needs so that they can reassess your need for medications and monitor your lab values.    Today   CHIEF COMPLAINT:   Chief Complaint  Patient presents with  . Hip Pain    HISTORY OF PRESENT ILLNESS:  Alexander Alvarado  is a 78 y.o. male presented with hip pain and found to have a right hip fracture   VITAL SIGNS:  Blood pressure (!) 163/79, pulse 100, temperature (!) 97.5 F (36.4 C), temperature source Oral, resp. rate 18, height 5\' 11"  (1.803 m), weight 77.1 kg, SpO2 92 %.  I/O:    Intake/Output Summary (Last 24 hours) at 09/16/2019 0839 Last data filed at 09/16/2019 0647 Gross per 24 hour  Intake -  Output 750 ml  Net -750 ml    PHYSICAL EXAMINATION:  GENERAL:  78 y.o.-year-old patient lying in the bed with no acute distress.  EYES: Pupils equal, round, reactive  to light and accommodation. No scleral icterus. Extraocular muscles intact.  HEENT: Head atraumatic, normocephalic. Oropharynx and nasopharynx clear.  NECK:  Supple, no jugular venous distention. No thyroid enlargement, no tenderness.  LUNGS: Normal breath sounds bilaterally, no wheezing, rales,rhonchi or crepitation. No use of accessory muscles of respiration.  CARDIOVASCULAR: S1, S2 tachycardia. No murmurs, rubs, or gallops.  ABDOMEN: Soft, non-tender, non-distended. Bowel sounds present. No organomegaly or mass.  EXTREMITIES: No pedal edema, cyanosis, or clubbing.  NEUROLOGIC: Cranial nerves II through XII are intact. Muscle strength 5/5 in all extremities. Sensation intact. Gait not checked.  Patient able to straight leg raise bilaterally. PSYCHIATRIC: The patient is alert and oriented x 3.  SKIN: No obvious rash, lesion, or ulcer.   DATA REVIEW:   CBC Recent Labs  Lab 09/16/19 0521  WBC 11.7*  HGB 17.3*  HCT 47.1  PLT 193    Chemistries  Recent Labs  Lab 09/16/19 0521  NA  133*  K 3.2*  CL 97*  CO2 24  GLUCOSE 118*  BUN 15  CREATININE 0.55*  CALCIUM 8.6*  AST 41  ALT 25  ALKPHOS 76  BILITOT 1.7*    Microbiology Results  Results for orders placed or performed during the hospital encounter of 09/10/19  SARS CORONAVIRUS 2 (TAT 6-24 HRS) Nasopharyngeal Nasopharyngeal Swab     Status: None   Collection Time: 09/10/19  5:13 PM   Specimen: Nasopharyngeal Swab  Result Value Ref Range Status   SARS Coronavirus 2 NEGATIVE NEGATIVE Final    Comment: (NOTE) SARS-CoV-2 target nucleic acids are NOT DETECTED. The SARS-CoV-2 RNA is generally detectable in upper and lower respiratory specimens during the acute phase of infection. Negative results do not preclude SARS-CoV-2 infection, do not rule out co-infections with other pathogens, and should not be used as the sole basis for treatment or other patient management decisions. Negative results must be combined with clinical observations, patient history, and epidemiological information. The expected result is Negative. Fact Sheet for Patients: SugarRoll.be Fact Sheet for Healthcare Providers: https://www.woods-mathews.com/ This test is not yet approved or cleared by the [place] FDA and  has been authorized for detection and/or diagnosis of SARS-CoV-2 by FDA under an Emergency Use Authorization (EUA). This EUA will remain  in effect (meaning this test can be used) for the duration of the COVID-19 declaration under Section 56 4(b)(1) of the Act, 21 U.S.C. section 360bbb-3(b)(1), unless the authorization is terminated or revoked sooner. Performed at [facility], [place] [address], [place], [place] [address]   SARS CORONAVIRUS 2 (TAT 6-24 HRS) Nasopharyngeal Nasopharyngeal Swab     Status: None   Collection Time: 09/15/19  5:03 PM   Specimen: Nasopharyngeal Swab  Result Value Ref Range Status   SARS Coronavirus 2 NEGATIVE NEGATIVE Final    Comment:  (NOTE) SARS-CoV-2 target nucleic acids are NOT DETECTED. The SARS-CoV-2 RNA is generally detectable in upper and lower respiratory specimens during the acute phase of infection. Negative results do not preclude SARS-CoV-2 infection, do not rule out co-infections with other pathogens, and should not be used as the sole basis for treatment or other patient management decisions. Negative results must be combined with clinical observations, patient history, and epidemiological information. The expected result is Negative. Fact Sheet for Patients: SugarRoll.be Fact Sheet for Healthcare Providers: https://www.woods-mathews.com/ This test is not yet approved or cleared by the [place] FDA and  has been authorized for detection and/or diagnosis of SARS-CoV-2 by FDA under an Emergency Use Authorization (EUA).  This EUA will remain  in effect (meaning this test can be used) for the duration of the COVID-19 declaration under Section 56 4(b)(1) of the Act, 21 U.S.C. section 360bbb-3(b)(1), unless the authorization is terminated or revoked sooner. Performed at [facility], [place] [address], [place], [place] [address]      Management plans discussed with the patient, and he is in agreement.  Left message for son.  CODE STATUS:     Code Status Orders  (From admission, onward)         Start     Ordered   09/10/19 2001  Do not attempt resuscitation (DNR)  Continuous    Question Answer Comment  In the event of cardiac or respiratory ARREST Do not call a "code blue"   In the event of cardiac or respiratory ARREST Do not perform Intubation, CPR, defibrillation or ACLS   In the event of cardiac or respiratory ARREST Use medication by any route, position, wound care, and other measures to relive pain and suffering. May use oxygen, suction and manual treatment of airway obstruction as needed for comfort.      09/10/19 2001        Code Status  History    Date Active Date Inactive Code Status Order ID Comments User Context   08/31/2015 1248 08/31/2015 1642 Full Code [id number]  Samara Deist, DPM Inpatient   Advance Care Planning Activity    Advance Directive Documentation     Most Recent Value  Type of Advance Directive  Healthcare Power of Attorney  Pre-existing out of facility DNR order (yellow form or pink MOST form)  -  "MOST" Form in Place?  -      TOTAL TIME TAKING CARE OF THIS PATIENT: 35 minutes.    Loletha Grayer M.D on 09/16/2019 at 8:39 AM  Between 7am to 6pm - Pager - [phone]  After 6pm go to [email or web address] - password [email or web address]  [facility]  CC: Primary care physician; Glean Hess, MD

## 2019-09-16 NOTE — Progress Notes (Signed)
Patient ID: Alexander Alvarado, male   DOB: 04-19-1941, 78 y.o.   MRN: [id number]  Came back to speak with the patient.  The patient has no thoughts of hurting himself or other people.  I encouraged him to eat more.  Patient is upset that he is unable to do much.  I said that is very common after a hip surgery.  I told him that he is able to do more than most people have done after surgery.  He is able to straight leg raise without a problem.  The next step is walking in the rehab.  The patient did have his pulse ox dropped down to 85% on room air.  Can give oxygen 2 L nasal cannula for pulse ox less than 88%.  Dr Loletha Grayer

## 2019-09-16 NOTE — Progress Notes (Signed)
Report called to [facility] RN. VSS. IV removed. In no acute distress.

## 2019-09-20 DIAGNOSIS — S7291XD Unspecified fracture of right femur, subsequent encounter for closed fracture with routine healing: Secondary | ICD-10-CM | POA: Diagnosis not present

## 2019-09-20 DIAGNOSIS — Z8673 Personal history of transient ischemic attack (TIA), and cerebral infarction without residual deficits: Secondary | ICD-10-CM | POA: Diagnosis not present

## 2019-09-20 DIAGNOSIS — G4700 Insomnia, unspecified: Secondary | ICD-10-CM | POA: Diagnosis not present

## 2019-09-20 DIAGNOSIS — Z9981 Dependence on supplemental oxygen: Secondary | ICD-10-CM | POA: Diagnosis not present

## 2019-09-20 DIAGNOSIS — M6281 Muscle weakness (generalized): Secondary | ICD-10-CM | POA: Diagnosis not present

## 2019-09-20 DIAGNOSIS — I10 Essential (primary) hypertension: Secondary | ICD-10-CM | POA: Diagnosis not present

## 2019-09-20 DIAGNOSIS — I739 Peripheral vascular disease, unspecified: Secondary | ICD-10-CM | POA: Diagnosis not present

## 2019-09-20 DIAGNOSIS — Z7901 Long term (current) use of anticoagulants: Secondary | ICD-10-CM | POA: Diagnosis not present

## 2019-09-20 DIAGNOSIS — E785 Hyperlipidemia, unspecified: Secondary | ICD-10-CM | POA: Diagnosis not present

## 2019-09-24 ENCOUNTER — Other Ambulatory Visit: Payer: Self-pay | Admitting: Internal Medicine

## 2019-09-24 DIAGNOSIS — I10 Essential (primary) hypertension: Secondary | ICD-10-CM

## 2019-09-24 DIAGNOSIS — E782 Mixed hyperlipidemia: Secondary | ICD-10-CM

## 2019-09-28 DIAGNOSIS — M9701XA Periprosthetic fracture around internal prosthetic right hip joint, initial encounter: Secondary | ICD-10-CM | POA: Diagnosis not present

## 2019-09-29 DIAGNOSIS — M6281 Muscle weakness (generalized): Secondary | ICD-10-CM | POA: Diagnosis not present

## 2019-09-29 DIAGNOSIS — Z8673 Personal history of transient ischemic attack (TIA), and cerebral infarction without residual deficits: Secondary | ICD-10-CM | POA: Diagnosis not present

## 2019-09-29 DIAGNOSIS — E785 Hyperlipidemia, unspecified: Secondary | ICD-10-CM | POA: Diagnosis not present

## 2019-09-29 DIAGNOSIS — K219 Gastro-esophageal reflux disease without esophagitis: Secondary | ICD-10-CM | POA: Diagnosis not present

## 2019-09-29 DIAGNOSIS — I739 Peripheral vascular disease, unspecified: Secondary | ICD-10-CM | POA: Diagnosis not present

## 2019-09-29 DIAGNOSIS — G4700 Insomnia, unspecified: Secondary | ICD-10-CM | POA: Diagnosis not present

## 2019-09-29 DIAGNOSIS — I10 Essential (primary) hypertension: Secondary | ICD-10-CM | POA: Diagnosis not present

## 2019-09-29 DIAGNOSIS — S7291XD Unspecified fracture of right femur, subsequent encounter for closed fracture with routine healing: Secondary | ICD-10-CM | POA: Diagnosis not present

## 2019-09-30 LAB — CALR + MPL (REFLEXED)

## 2019-09-30 LAB — JAK2 W/REFLEX TO CALR/MPL

## 2019-10-07 DIAGNOSIS — M6281 Muscle weakness (generalized): Secondary | ICD-10-CM | POA: Diagnosis not present

## 2019-10-07 DIAGNOSIS — S7291XD Unspecified fracture of right femur, subsequent encounter for closed fracture with routine healing: Secondary | ICD-10-CM | POA: Diagnosis not present

## 2019-10-07 DIAGNOSIS — I10 Essential (primary) hypertension: Secondary | ICD-10-CM | POA: Diagnosis not present

## 2019-10-07 DIAGNOSIS — K219 Gastro-esophageal reflux disease without esophagitis: Secondary | ICD-10-CM | POA: Diagnosis not present

## 2019-10-07 DIAGNOSIS — I739 Peripheral vascular disease, unspecified: Secondary | ICD-10-CM | POA: Diagnosis not present

## 2019-10-07 DIAGNOSIS — Z8673 Personal history of transient ischemic attack (TIA), and cerebral infarction without residual deficits: Secondary | ICD-10-CM | POA: Diagnosis not present

## 2019-10-07 DIAGNOSIS — E785 Hyperlipidemia, unspecified: Secondary | ICD-10-CM | POA: Diagnosis not present

## 2019-10-07 DIAGNOSIS — G4700 Insomnia, unspecified: Secondary | ICD-10-CM | POA: Diagnosis not present

## 2019-10-19 ENCOUNTER — Other Ambulatory Visit: Payer: Self-pay

## 2019-10-19 NOTE — Patient Outreach (Signed)
[place] [facility]) Care Management  10/19/2019  Alexander Alvarado 1941-06-05 [id number]     Transition of Care Referral  Referral Date: 10/19/2019 Referral Source: [facility] Discharge Report Date of Admission: 09/16/2019 Diagnosis: "femur fracture" Date of Discharge: 10/15/2019 Facility: [facility] Insurance: [employer] Medicare    Outreach attempt #1 to patient. No answer. RN CM left HIPAA compliant voicemail message along with contact info.    Plan: RN CM will make outreach attempt to patient within 3-4 business days. RN CM will send unsuccessful outreach letter to patient.   Enzo Montgomery, RN,BSN,CCM [place] Management Telephonic Care Management Coordinator Direct Phone: [phone] Toll Free: [phone] Fax: [phone]

## 2019-10-20 ENCOUNTER — Other Ambulatory Visit: Payer: Self-pay

## 2019-10-20 DIAGNOSIS — I10 Essential (primary) hypertension: Secondary | ICD-10-CM | POA: Diagnosis not present

## 2019-10-20 DIAGNOSIS — S7291XD Unspecified fracture of right femur, subsequent encounter for closed fracture with routine healing: Secondary | ICD-10-CM | POA: Diagnosis not present

## 2019-10-20 DIAGNOSIS — L03114 Cellulitis of left upper limb: Secondary | ICD-10-CM | POA: Diagnosis not present

## 2019-10-20 DIAGNOSIS — Z8701 Personal history of pneumonia (recurrent): Secondary | ICD-10-CM | POA: Diagnosis not present

## 2019-10-20 DIAGNOSIS — Z8673 Personal history of transient ischemic attack (TIA), and cerebral infarction without residual deficits: Secondary | ICD-10-CM | POA: Diagnosis not present

## 2019-10-20 DIAGNOSIS — G4700 Insomnia, unspecified: Secondary | ICD-10-CM | POA: Diagnosis not present

## 2019-10-20 DIAGNOSIS — E785 Hyperlipidemia, unspecified: Secondary | ICD-10-CM | POA: Diagnosis not present

## 2019-10-20 NOTE — Patient Outreach (Signed)
[place] [facility]) Care Management  10/20/2019  Alexander Alvarado 1941-01-23 [id number]   Transition of Care Referral  Referral Date: 10/19/2019 Referral Source: [facility] Discharge Report Date of Admission: 09/16/2019 Diagnosis: "femur fracture" Date of Discharge: 10/15/2019 Facility: [facility] Insurance: [employer] Medicare   Outreach attempt #2 to patient. No answer at present.     Plan: RN CM will make outreach attempt to patient within 3-4 business days.   Enzo Montgomery, RN,BSN,CCM [place] Management Telephonic Care Management Coordinator Direct Phone: [phone] Toll Free: [phone] Fax: [phone]

## 2019-10-21 ENCOUNTER — Other Ambulatory Visit: Payer: Self-pay

## 2019-10-21 DIAGNOSIS — D649 Anemia, unspecified: Secondary | ICD-10-CM | POA: Diagnosis not present

## 2019-10-21 DIAGNOSIS — R319 Hematuria, unspecified: Secondary | ICD-10-CM | POA: Diagnosis not present

## 2019-10-21 DIAGNOSIS — M2550 Pain in unspecified joint: Secondary | ICD-10-CM | POA: Diagnosis not present

## 2019-10-21 DIAGNOSIS — N390 Urinary tract infection, site not specified: Secondary | ICD-10-CM | POA: Diagnosis not present

## 2019-10-21 NOTE — Patient Outreach (Signed)
[place] [facility]) Care Management  10/21/2019  HARVE MCGRUDER 16-Apr-1941 [id number]   Transition of Care Referral  Referral Date:10/19/2019 Referral Source:[employer] Discharge Report Date of Admission:09/16/2019 Diagnosis:"femur fracture" Date of Discharge:10/15/2019 Facility:[facility] Insurance:[employer] Medicare   Outreach attempt #3 to patient. No answer at present. RN CM left HIPAA compliant voicemail message along with contact info.     Plan: RN CM will close case if no response from letter mailed to patient.   Enzo Montgomery, RN,BSN,CCM [place] Management Telephonic Care Management Coordinator Direct Phone: [phone] Toll Free: [phone] Fax: [phone]

## 2019-10-28 DIAGNOSIS — L98499 Non-pressure chronic ulcer of skin of other sites with unspecified severity: Secondary | ICD-10-CM | POA: Diagnosis not present

## 2019-10-29 DIAGNOSIS — M9701XA Periprosthetic fracture around internal prosthetic right hip joint, initial encounter: Secondary | ICD-10-CM | POA: Diagnosis not present

## 2019-10-29 DIAGNOSIS — L03114 Cellulitis of left upper limb: Secondary | ICD-10-CM | POA: Diagnosis not present

## 2019-10-29 DIAGNOSIS — Z79899 Other long term (current) drug therapy: Secondary | ICD-10-CM | POA: Diagnosis not present

## 2019-10-30 ENCOUNTER — Other Ambulatory Visit: Payer: Self-pay

## 2019-10-30 NOTE — Patient Outreach (Signed)
[place] [facility]) Care Management  10/30/2019  Alexander Alvarado 14-Jul-1941 [id number]   Transition of Care Referral  Referral Date:10/19/2019 Referral Source:[employer] Discharge Report Date of Admission:09/16/2019 Diagnosis:"femur fracture" Date of Discharge:10/15/2019 Facility:[facility] Insurance:[employer] Medicare    Multiple attempts to establish contact with patient without success. No response from letter mailed to patient. Case is being closed at this time.    Plan: RN CM will close case at this time.   Enzo Montgomery, RN,BSN,CCM [place] Management Telephonic Care Management Coordinator Direct Phone: [phone] Toll Free: [phone] Fax: [phone]

## 2019-11-05 DIAGNOSIS — S91302D Unspecified open wound, left foot, subsequent encounter: Secondary | ICD-10-CM | POA: Diagnosis not present

## 2019-11-05 DIAGNOSIS — D519 Vitamin B12 deficiency anemia, unspecified: Secondary | ICD-10-CM | POA: Diagnosis not present

## 2019-11-05 DIAGNOSIS — M1990 Unspecified osteoarthritis, unspecified site: Secondary | ICD-10-CM | POA: Diagnosis not present

## 2019-11-05 DIAGNOSIS — I10 Essential (primary) hypertension: Secondary | ICD-10-CM | POA: Diagnosis not present

## 2019-11-05 DIAGNOSIS — K219 Gastro-esophageal reflux disease without esophagitis: Secondary | ICD-10-CM | POA: Diagnosis not present

## 2019-11-05 DIAGNOSIS — I739 Peripheral vascular disease, unspecified: Secondary | ICD-10-CM | POA: Diagnosis not present

## 2019-11-05 DIAGNOSIS — E785 Hyperlipidemia, unspecified: Secondary | ICD-10-CM | POA: Diagnosis not present

## 2019-11-05 DIAGNOSIS — L03114 Cellulitis of left upper limb: Secondary | ICD-10-CM | POA: Diagnosis not present

## 2019-11-05 DIAGNOSIS — S72141D Displaced intertrochanteric fracture of right femur, subsequent encounter for closed fracture with routine healing: Secondary | ICD-10-CM | POA: Diagnosis not present

## 2019-11-05 DIAGNOSIS — J9611 Chronic respiratory failure with hypoxia: Secondary | ICD-10-CM | POA: Diagnosis not present

## 2019-11-07 DIAGNOSIS — M1990 Unspecified osteoarthritis, unspecified site: Secondary | ICD-10-CM | POA: Diagnosis not present

## 2019-11-07 DIAGNOSIS — D519 Vitamin B12 deficiency anemia, unspecified: Secondary | ICD-10-CM | POA: Diagnosis not present

## 2019-11-07 DIAGNOSIS — L03114 Cellulitis of left upper limb: Secondary | ICD-10-CM | POA: Diagnosis not present

## 2019-11-07 DIAGNOSIS — J9611 Chronic respiratory failure with hypoxia: Secondary | ICD-10-CM | POA: Diagnosis not present

## 2019-11-07 DIAGNOSIS — K219 Gastro-esophageal reflux disease without esophagitis: Secondary | ICD-10-CM | POA: Diagnosis not present

## 2019-11-07 DIAGNOSIS — E785 Hyperlipidemia, unspecified: Secondary | ICD-10-CM | POA: Diagnosis not present

## 2019-11-07 DIAGNOSIS — I10 Essential (primary) hypertension: Secondary | ICD-10-CM | POA: Diagnosis not present

## 2019-11-07 DIAGNOSIS — I739 Peripheral vascular disease, unspecified: Secondary | ICD-10-CM | POA: Diagnosis not present

## 2019-11-07 DIAGNOSIS — S72141D Displaced intertrochanteric fracture of right femur, subsequent encounter for closed fracture with routine healing: Secondary | ICD-10-CM | POA: Diagnosis not present

## 2019-11-09 ENCOUNTER — Telehealth: Payer: Self-pay

## 2019-11-09 DIAGNOSIS — D519 Vitamin B12 deficiency anemia, unspecified: Secondary | ICD-10-CM | POA: Diagnosis not present

## 2019-11-09 DIAGNOSIS — E785 Hyperlipidemia, unspecified: Secondary | ICD-10-CM | POA: Diagnosis not present

## 2019-11-09 DIAGNOSIS — J9611 Chronic respiratory failure with hypoxia: Secondary | ICD-10-CM | POA: Diagnosis not present

## 2019-11-09 DIAGNOSIS — L03114 Cellulitis of left upper limb: Secondary | ICD-10-CM | POA: Diagnosis not present

## 2019-11-09 DIAGNOSIS — S72141D Displaced intertrochanteric fracture of right femur, subsequent encounter for closed fracture with routine healing: Secondary | ICD-10-CM | POA: Diagnosis not present

## 2019-11-09 DIAGNOSIS — M1990 Unspecified osteoarthritis, unspecified site: Secondary | ICD-10-CM | POA: Diagnosis not present

## 2019-11-09 DIAGNOSIS — I10 Essential (primary) hypertension: Secondary | ICD-10-CM | POA: Diagnosis not present

## 2019-11-09 DIAGNOSIS — K219 Gastro-esophageal reflux disease without esophagitis: Secondary | ICD-10-CM | POA: Diagnosis not present

## 2019-11-09 DIAGNOSIS — I739 Peripheral vascular disease, unspecified: Secondary | ICD-10-CM | POA: Diagnosis not present

## 2019-11-09 NOTE — Telephone Encounter (Signed)
Alexander Alvarado OT from [facility] called requesting 2 x a week for 2 weeks and 1 x a week for 2 weeks.   Gave verbal over her VM for pt.  CB# [phone]

## 2019-11-10 ENCOUNTER — Telehealth: Payer: Self-pay

## 2019-11-10 DIAGNOSIS — J9611 Chronic respiratory failure with hypoxia: Secondary | ICD-10-CM | POA: Diagnosis not present

## 2019-11-10 DIAGNOSIS — L03114 Cellulitis of left upper limb: Secondary | ICD-10-CM | POA: Diagnosis not present

## 2019-11-10 DIAGNOSIS — K219 Gastro-esophageal reflux disease without esophagitis: Secondary | ICD-10-CM | POA: Diagnosis not present

## 2019-11-10 DIAGNOSIS — M1990 Unspecified osteoarthritis, unspecified site: Secondary | ICD-10-CM | POA: Diagnosis not present

## 2019-11-10 DIAGNOSIS — I10 Essential (primary) hypertension: Secondary | ICD-10-CM | POA: Diagnosis not present

## 2019-11-10 DIAGNOSIS — I739 Peripheral vascular disease, unspecified: Secondary | ICD-10-CM | POA: Diagnosis not present

## 2019-11-10 DIAGNOSIS — S72141D Displaced intertrochanteric fracture of right femur, subsequent encounter for closed fracture with routine healing: Secondary | ICD-10-CM | POA: Diagnosis not present

## 2019-11-10 DIAGNOSIS — D519 Vitamin B12 deficiency anemia, unspecified: Secondary | ICD-10-CM | POA: Diagnosis not present

## 2019-11-10 DIAGNOSIS — E785 Hyperlipidemia, unspecified: Secondary | ICD-10-CM | POA: Diagnosis not present

## 2019-11-10 NOTE — Telephone Encounter (Signed)
CB# [phone]  Santiago Glad RN from [place] called saying she believes pt medication for BP is messed up from rehab and she would like pt to schedule an appt to discuss.   Tried calling pt to schedule appt and no answer.  Will try again tomorrow to bring pt in for BP.

## 2019-11-11 DIAGNOSIS — J9611 Chronic respiratory failure with hypoxia: Secondary | ICD-10-CM | POA: Diagnosis not present

## 2019-11-11 DIAGNOSIS — M1990 Unspecified osteoarthritis, unspecified site: Secondary | ICD-10-CM | POA: Diagnosis not present

## 2019-11-11 DIAGNOSIS — I739 Peripheral vascular disease, unspecified: Secondary | ICD-10-CM | POA: Diagnosis not present

## 2019-11-11 DIAGNOSIS — E785 Hyperlipidemia, unspecified: Secondary | ICD-10-CM | POA: Diagnosis not present

## 2019-11-11 DIAGNOSIS — I10 Essential (primary) hypertension: Secondary | ICD-10-CM | POA: Diagnosis not present

## 2019-11-11 DIAGNOSIS — L03114 Cellulitis of left upper limb: Secondary | ICD-10-CM | POA: Diagnosis not present

## 2019-11-11 DIAGNOSIS — S72141D Displaced intertrochanteric fracture of right femur, subsequent encounter for closed fracture with routine healing: Secondary | ICD-10-CM | POA: Diagnosis not present

## 2019-11-11 DIAGNOSIS — K219 Gastro-esophageal reflux disease without esophagitis: Secondary | ICD-10-CM | POA: Diagnosis not present

## 2019-11-11 DIAGNOSIS — D519 Vitamin B12 deficiency anemia, unspecified: Secondary | ICD-10-CM | POA: Diagnosis not present

## 2019-11-12 ENCOUNTER — Other Ambulatory Visit: Payer: Self-pay | Admitting: Internal Medicine

## 2019-11-12 DIAGNOSIS — J9611 Chronic respiratory failure with hypoxia: Secondary | ICD-10-CM | POA: Diagnosis not present

## 2019-11-12 DIAGNOSIS — I10 Essential (primary) hypertension: Secondary | ICD-10-CM | POA: Diagnosis not present

## 2019-11-12 DIAGNOSIS — K219 Gastro-esophageal reflux disease without esophagitis: Secondary | ICD-10-CM | POA: Diagnosis not present

## 2019-11-12 DIAGNOSIS — M1990 Unspecified osteoarthritis, unspecified site: Secondary | ICD-10-CM | POA: Diagnosis not present

## 2019-11-12 DIAGNOSIS — M19029 Primary osteoarthritis, unspecified elbow: Secondary | ICD-10-CM

## 2019-11-12 DIAGNOSIS — S72141D Displaced intertrochanteric fracture of right femur, subsequent encounter for closed fracture with routine healing: Secondary | ICD-10-CM

## 2019-11-12 DIAGNOSIS — J9621 Acute and chronic respiratory failure with hypoxia: Secondary | ICD-10-CM

## 2019-11-12 DIAGNOSIS — Z8701 Personal history of pneumonia (recurrent): Secondary | ICD-10-CM

## 2019-11-12 DIAGNOSIS — Z7982 Long term (current) use of aspirin: Secondary | ICD-10-CM

## 2019-11-12 DIAGNOSIS — L03114 Cellulitis of left upper limb: Secondary | ICD-10-CM | POA: Diagnosis not present

## 2019-11-12 DIAGNOSIS — E785 Hyperlipidemia, unspecified: Secondary | ICD-10-CM | POA: Diagnosis not present

## 2019-11-12 DIAGNOSIS — Z9181 History of falling: Secondary | ICD-10-CM

## 2019-11-12 DIAGNOSIS — F17200 Nicotine dependence, unspecified, uncomplicated: Secondary | ICD-10-CM

## 2019-11-12 DIAGNOSIS — I739 Peripheral vascular disease, unspecified: Secondary | ICD-10-CM | POA: Diagnosis not present

## 2019-11-12 DIAGNOSIS — E538 Deficiency of other specified B group vitamins: Secondary | ICD-10-CM

## 2019-11-12 DIAGNOSIS — D519 Vitamin B12 deficiency anemia, unspecified: Secondary | ICD-10-CM | POA: Diagnosis not present

## 2019-11-12 DIAGNOSIS — E782 Mixed hyperlipidemia: Secondary | ICD-10-CM

## 2019-11-12 DIAGNOSIS — Z8673 Personal history of transient ischemic attack (TIA), and cerebral infarction without residual deficits: Secondary | ICD-10-CM

## 2019-11-12 DIAGNOSIS — I6350 Cerebral infarction due to unspecified occlusion or stenosis of unspecified cerebral artery: Secondary | ICD-10-CM

## 2019-11-12 DIAGNOSIS — Z85828 Personal history of other malignant neoplasm of skin: Secondary | ICD-10-CM

## 2019-11-12 NOTE — Progress Notes (Signed)
Received orders from [facility]. Start of care 11/05/2019. Initial certification needed.  Orders are reviewed, signed and faxed.

## 2019-11-13 ENCOUNTER — Ambulatory Visit (INDEPENDENT_AMBULATORY_CARE_PROVIDER_SITE_OTHER): Payer: Medicare HMO | Admitting: Internal Medicine

## 2019-11-13 ENCOUNTER — Other Ambulatory Visit: Payer: Self-pay

## 2019-11-13 ENCOUNTER — Encounter: Payer: Self-pay | Admitting: Internal Medicine

## 2019-11-13 VITALS — BP 124/78 | HR 81 | Temp 98.3°F | Ht 71.0 in | Wt 169.0 lb

## 2019-11-13 DIAGNOSIS — D519 Vitamin B12 deficiency anemia, unspecified: Secondary | ICD-10-CM | POA: Diagnosis not present

## 2019-11-13 DIAGNOSIS — K219 Gastro-esophageal reflux disease without esophagitis: Secondary | ICD-10-CM

## 2019-11-13 DIAGNOSIS — I10 Essential (primary) hypertension: Secondary | ICD-10-CM

## 2019-11-13 DIAGNOSIS — L89603 Pressure ulcer of unspecified heel, stage 3: Secondary | ICD-10-CM | POA: Diagnosis not present

## 2019-11-13 DIAGNOSIS — S72001S Fracture of unspecified part of neck of right femur, sequela: Secondary | ICD-10-CM | POA: Diagnosis not present

## 2019-11-13 DIAGNOSIS — E785 Hyperlipidemia, unspecified: Secondary | ICD-10-CM | POA: Diagnosis not present

## 2019-11-13 DIAGNOSIS — S72141D Displaced intertrochanteric fracture of right femur, subsequent encounter for closed fracture with routine healing: Secondary | ICD-10-CM | POA: Diagnosis not present

## 2019-11-13 DIAGNOSIS — I6350 Cerebral infarction due to unspecified occlusion or stenosis of unspecified cerebral artery: Secondary | ICD-10-CM | POA: Diagnosis not present

## 2019-11-13 DIAGNOSIS — I739 Peripheral vascular disease, unspecified: Secondary | ICD-10-CM

## 2019-11-13 DIAGNOSIS — M1990 Unspecified osteoarthritis, unspecified site: Secondary | ICD-10-CM | POA: Diagnosis not present

## 2019-11-13 DIAGNOSIS — L03114 Cellulitis of left upper limb: Secondary | ICD-10-CM | POA: Diagnosis not present

## 2019-11-13 DIAGNOSIS — J9611 Chronic respiratory failure with hypoxia: Secondary | ICD-10-CM | POA: Diagnosis not present

## 2019-11-13 MED ORDER — METOPROLOL TARTRATE 25 MG PO TABS: 25.0000 mg | ORAL_TABLET | Freq: Two times a day (BID) | ORAL | 3 refills | Status: DC

## 2019-11-13 MED ORDER — OMEPRAZOLE 40 MG PO CPDR: 40.0000 mg | DELAYED_RELEASE_CAPSULE | Freq: Every day | ORAL | 3 refills | Status: AC

## 2019-11-13 MED ORDER — METOPROLOL TARTRATE 25 MG PO TABS: 25.0000 mg | ORAL_TABLET | Freq: Two times a day (BID) | ORAL | 3 refills | Status: AC

## 2019-11-13 MED ORDER — AMLODIPINE BESYLATE 10 MG PO TABS: 10.0000 mg | ORAL_TABLET | Freq: Every day | ORAL | 1 refills | Status: AC

## 2019-11-13 MED ORDER — QUINAPRIL HCL 40 MG PO TABS: 40.0000 mg | ORAL_TABLET | Freq: Every day | ORAL | 1 refills | Status: AC

## 2019-11-13 MED ORDER — HYDRALAZINE HCL 25 MG PO TABS: 25.0000 mg | ORAL_TABLET | Freq: Two times a day (BID) | ORAL | 3 refills | Status: DC

## 2019-11-13 MED ORDER — HYDRALAZINE HCL 25 MG PO TABS: 25.0000 mg | ORAL_TABLET | Freq: Two times a day (BID) | ORAL | 3 refills | Status: AC

## 2019-11-13 NOTE — Progress Notes (Signed)
Date:  11/13/2019   Name:  Alexander Alvarado   DOB:  12-Jul-1941   MRN:  [id number]   Chief Complaint: Hypertension (Follow up on meds and Bp.) and Blister (Blisters on the bottom of both feet since being in rehab. Very painful.)  Hypertension This is a chronic problem. The problem is controlled. Associated symptoms include shortness of breath. Pertinent negatives include no chest pain or palpitations. Past treatments include beta blockers, calcium channel blockers, direct vasodilators and ACE inhibitors. The current treatment provides significant improvement. There are no compliance problems.    Hip fracture - he is now home from rehab at [facility].  Doing fairly well.  HH and therapy are coming in.  He is working on [place].  He denies hip pain.  He uses a walker in his home.  Heel ulcers - while in the hospital and rehab he developed ulcers on both heels.  They are very painful.  He is unable to perform any wound care.  He has known PVD and has seen podiatry in the past.  Lab Results  Component Value Date   CREATININE 0.55 (L) 09/16/2019   BUN 15 09/16/2019   NA 133 (L) 09/16/2019   K 3.2 (L) 09/16/2019   CL 97 (L) 09/16/2019   CO2 24 09/16/2019   Lab Results  Component Value Date   CHOL 137 02/10/2019   HDL 31 (L) 02/10/2019   LDLCALC 32 02/10/2019   TRIG 368 (H) 02/10/2019   CHOLHDL 4.4 02/10/2019   Lab Results  Component Value Date   TSH 3.500 01/08/2018   Lab Results  Component Value Date   HGBA1C 6.5 (H) 02/10/2019     Review of Systems  Constitutional: Negative for chills, fatigue and fever.  Respiratory: Positive for cough and shortness of breath. Negative for chest tightness and wheezing.   Cardiovascular: Negative for chest pain, palpitations and leg swelling.  Gastrointestinal: Negative for abdominal pain and constipation.  Genitourinary: Negative for dysuria.  Skin: Positive for wound.  Neurological: Negative for dizziness and light-headedness.   Psychiatric/Behavioral: Negative for dysphoric mood and sleep disturbance. The patient is not nervous/anxious.     Patient Active Problem List   Diagnosis Date Noted  . Closed hip fracture requiring operative repair, right, sequela   . Acute on chronic respiratory failure with hypoxia ([place])   . Aspiration pneumonia of right lower lobe due to gastric secretions ([place])   . Tachycardia   . Femur fracture, right ([place]) 09/10/2019  . Amputation of toe of left foot ([place]) 02/10/2019  . Pre-diabetes 01/07/2017  . B12 nutritional deficiency 12/07/2016  . Folic acid deficiency [id number]  . Cerebrovascular accident (CVA) due to occlusion of cerebral artery ([place]) 12/30/2015  . Senile ecchymosis 12/30/2015  . Essential hypertension 03/12/2015  . Gastro-esophageal reflux disease without esophagitis 03/12/2015  . Hyperlipidemia, mixed 03/12/2015  . Insomnia, persistent 03/12/2015  . Neoplasm of uncertain behavior of skin of hand 03/12/2015  . Arthritis, degenerative 03/12/2015  . Peripheral vascular disease ([place]) 03/12/2015  . Compulsive tobacco user syndrome 03/12/2015  . H/O alcohol abuse 10/29/2012    No Known Allergies  Past Surgical History:  Procedure Laterality Date  . AMPUTATION Left 08/31/2015   Procedure: AMPUTATION DIGIT left 2nd toe ;  Surgeon: Samara Deist, DPM;  Location: [place];  Service: Podiatry;  Laterality: Left;  . AMPUTATION TOE Left 08/2015   second toe  . CHOLECYSTECTOMY    . HIP ARTHROPLASTY Right 09/11/2019   Procedure: ARTHROPLASTY  BIPOLAR HIP (HEMIARTHROPLASTY);  Surgeon: Leim Fabry, MD;  Location: [facility];  Service: Orthopedics;  Laterality: Right;  . LUMBAR DISC SURGERY    . VASCULAR SURGERY Left 2011   x 5 to left leg    Social History   Tobacco Use  . Smoking status: Current Every Day Smoker    Packs/day: 0.50    Types: Cigarettes  . Smokeless tobacco: Never Used  . Tobacco comment: 3-4 ciggs daily  Substance Use Topics  . Alcohol  use: No    Alcohol/week: 0.0 standard drinks  . Drug use: No     Medication list has been reviewed and updated.  Current Meds  Medication Sig  . acetaminophen (TYLENOL) 325 MG tablet Take 2 tablets (650 mg total) by mouth every 6 (six) hours as needed for mild pain.  Marland Kitchen amLODipine (NORVASC) 10 MG tablet Take 1 tablet (10 mg total) by mouth daily.  Marland Kitchen aspirin 81 MG tablet Take 81 mg by mouth daily.   Marland Kitchen atorvastatin (LIPITOR) 20 MG tablet TAKE 1 TABLET BY MOUTH AT BEDTIME  . cyanocobalamin 1000 MCG tablet Take 1,000 mcg by mouth daily.   . folic acid (FOLVITE) 1 MG tablet Take 1 mg by mouth daily.   . hydrALAZINE (APRESOLINE) 25 MG tablet Take 1 tablet (25 mg total) by mouth 2 (two) times daily.  . metoprolol tartrate (LOPRESSOR) 25 MG tablet Take 1 tablet (25 mg total) by mouth 3 (three) times daily after meals. (Patient taking differently: Take 25 mg by mouth 2 (two) times daily. )  . omeprazole (PRILOSEC) 40 MG capsule Take 1 capsule (40 mg total) by mouth daily.  Marland Kitchen oxyCODONE (OXY IR/ROXICODONE) 5 MG immediate release tablet Take 1 tablet (5 mg total) by mouth every 3 (three) hours as needed for moderate pain (pain score 4-6).  Marland Kitchen quinapril (ACCUPRIL) 40 MG tablet Take 1 tablet by mouth once daily  . traMADol (ULTRAM) 50 MG tablet Take 1 tablet (50 mg total) by mouth every 6 (six) hours as needed for moderate pain.  . traZODone (DESYREL) 50 MG tablet Take 1-2 tablets (50-100 mg total) by mouth at bedtime.    PHQ 2/9 Scores 11/13/2019 02/10/2019 01/13/2018 01/08/2018  PHQ - 2 Score 6 6 6 6   PHQ- 9 Score 20 21 20 17     BP Readings from Last 3 Encounters:  11/13/19 124/78  09/16/19 (!) 165/86  02/10/19 128/74    Physical Exam Vitals and nursing note reviewed.  Constitutional:      General: He is not in acute distress.    Appearance: Normal appearance. He is well-developed.  HENT:     Head: Normocephalic and atraumatic.  Cardiovascular:     Rate and Rhythm: Normal rate and regular  rhythm.     Pulses: Decreased pulses (unable to palpate pulses in ankles/feet).     Heart sounds: No murmur.  Pulmonary:     Effort: Pulmonary effort is normal. No respiratory distress.     Breath sounds: No decreased air movement. Decreased breath sounds present. No wheezing.  Musculoskeletal:        General: Normal range of motion.  Skin:    General: Skin is warm and dry.     Findings: No rash.     Comments: 1 cm stage 3+ ulcer on right heel with eschar and central exudate 0.5 cm stage 3+ ulcer on left heel with central exudate 0.25 cm eschar on left third toe and left 5th toe Long thick nails on both feet  Neurological:     Mental Status: He is alert and oriented to person, place, and time.  Psychiatric:        Behavior: Behavior normal.        Thought Content: Thought content normal.     Wt Readings from Last 3 Encounters:  11/13/19 169 lb (76.7 kg)  09/10/19 170 lb (77.1 kg)  02/10/19 187 lb (84.8 kg)    BP 124/78   Pulse 81   Temp 98.3 F (36.8 C) (Oral)   Ht 5\' 11"  (1.803 m)   Wt 169 lb (76.7 kg)   SpO2 91%   BMI 23.57 kg/m   Assessment and Plan: 1. Essential hypertension Clinically stable exam with well controlled BP on current quadruple therapy. Tolerating medications without side effects at this time. Pt to continue current regimen and low sodium diet; benefits of regular exercise as able discussed. - amLODipine (NORVASC) 10 MG tablet; Take 1 tablet (10 mg total) by mouth daily.  Dispense: 90 tablet; Refill: 1 - quinapril (ACCUPRIL) 40 MG tablet; Take 1 tablet (40 mg total) by mouth daily.  Dispense: 90 tablet; Refill: 1 - metoprolol tartrate (LOPRESSOR) 25 MG tablet; Take 1 tablet (25 mg total) by mouth 2 (two) times daily.  Dispense: 180 tablet; Refill: 3 - hydrALAZINE (APRESOLINE) 25 MG tablet; Take 1 tablet (25 mg total) by mouth 2 (two) times daily.  Dispense: 180 tablet; Refill: 3  2. Pressure injury of heel, stage 3, unspecified laterality  [facility]) Needs podiatry evaluation for ulcers, nails and skin care - Ambulatory referral to Podiatry  3. Peripheral vascular disease ([place]) Pt unable to drive, has multiple medical issues for which he needs transportation, etc - Referral to Chronic Care Management Services  4. Closed hip fracture requiring operative repair, right, sequela Doing well with minimal pain; progressing with PT; ambulating with a walker at home - Referral to Chronic Care Management Services  5. Cerebrovascular accident (CVA) due to occlusion of cerebral artery [facility]) Old injury with chronic limitations - Referral to Chronic Care Management Services  6. Gastro-esophageal reflux disease without esophagitis - omeprazole (PRILOSEC) 40 MG capsule; Take 1 capsule (40 mg total) by mouth daily.  Dispense: 90 capsule; Refill: 3  Pt will call [facility] to get his second Covid vaccine. Partially dictated using Editor, commissioning. Any errors are unintentional.  Halina Maidens, MD [facility]  11/13/2019

## 2019-11-13 NOTE — Patient Instructions (Signed)
Call Mardene Celeste at [employer] for [address]nd [employer]. [phone] [phone]   They will administer while your in the car after you set up the appointment time.

## 2019-11-16 DIAGNOSIS — J9611 Chronic respiratory failure with hypoxia: Secondary | ICD-10-CM | POA: Diagnosis not present

## 2019-11-16 DIAGNOSIS — M1990 Unspecified osteoarthritis, unspecified site: Secondary | ICD-10-CM | POA: Diagnosis not present

## 2019-11-16 DIAGNOSIS — E785 Hyperlipidemia, unspecified: Secondary | ICD-10-CM | POA: Diagnosis not present

## 2019-11-16 DIAGNOSIS — D519 Vitamin B12 deficiency anemia, unspecified: Secondary | ICD-10-CM | POA: Diagnosis not present

## 2019-11-16 DIAGNOSIS — I739 Peripheral vascular disease, unspecified: Secondary | ICD-10-CM | POA: Diagnosis not present

## 2019-11-16 DIAGNOSIS — S72141D Displaced intertrochanteric fracture of right femur, subsequent encounter for closed fracture with routine healing: Secondary | ICD-10-CM | POA: Diagnosis not present

## 2019-11-16 DIAGNOSIS — K219 Gastro-esophageal reflux disease without esophagitis: Secondary | ICD-10-CM | POA: Diagnosis not present

## 2019-11-16 DIAGNOSIS — I10 Essential (primary) hypertension: Secondary | ICD-10-CM | POA: Diagnosis not present

## 2019-11-16 DIAGNOSIS — L03114 Cellulitis of left upper limb: Secondary | ICD-10-CM | POA: Diagnosis not present

## 2019-11-17 DIAGNOSIS — D519 Vitamin B12 deficiency anemia, unspecified: Secondary | ICD-10-CM | POA: Diagnosis not present

## 2019-11-17 DIAGNOSIS — I739 Peripheral vascular disease, unspecified: Secondary | ICD-10-CM | POA: Diagnosis not present

## 2019-11-17 DIAGNOSIS — S72141D Displaced intertrochanteric fracture of right femur, subsequent encounter for closed fracture with routine healing: Secondary | ICD-10-CM | POA: Diagnosis not present

## 2019-11-17 DIAGNOSIS — L03114 Cellulitis of left upper limb: Secondary | ICD-10-CM | POA: Diagnosis not present

## 2019-11-17 DIAGNOSIS — K219 Gastro-esophageal reflux disease without esophagitis: Secondary | ICD-10-CM | POA: Diagnosis not present

## 2019-11-17 DIAGNOSIS — I10 Essential (primary) hypertension: Secondary | ICD-10-CM | POA: Diagnosis not present

## 2019-11-17 DIAGNOSIS — J9611 Chronic respiratory failure with hypoxia: Secondary | ICD-10-CM | POA: Diagnosis not present

## 2019-11-17 DIAGNOSIS — E785 Hyperlipidemia, unspecified: Secondary | ICD-10-CM | POA: Diagnosis not present

## 2019-11-17 DIAGNOSIS — M1990 Unspecified osteoarthritis, unspecified site: Secondary | ICD-10-CM | POA: Diagnosis not present

## 2019-11-19 DIAGNOSIS — E785 Hyperlipidemia, unspecified: Secondary | ICD-10-CM | POA: Diagnosis not present

## 2019-11-19 DIAGNOSIS — D519 Vitamin B12 deficiency anemia, unspecified: Secondary | ICD-10-CM | POA: Diagnosis not present

## 2019-11-19 DIAGNOSIS — S72141D Displaced intertrochanteric fracture of right femur, subsequent encounter for closed fracture with routine healing: Secondary | ICD-10-CM | POA: Diagnosis not present

## 2019-11-19 DIAGNOSIS — M1990 Unspecified osteoarthritis, unspecified site: Secondary | ICD-10-CM | POA: Diagnosis not present

## 2019-11-19 DIAGNOSIS — J9611 Chronic respiratory failure with hypoxia: Secondary | ICD-10-CM | POA: Diagnosis not present

## 2019-11-19 DIAGNOSIS — I10 Essential (primary) hypertension: Secondary | ICD-10-CM | POA: Diagnosis not present

## 2019-11-19 DIAGNOSIS — K219 Gastro-esophageal reflux disease without esophagitis: Secondary | ICD-10-CM | POA: Diagnosis not present

## 2019-11-19 DIAGNOSIS — L03114 Cellulitis of left upper limb: Secondary | ICD-10-CM | POA: Diagnosis not present

## 2019-11-19 DIAGNOSIS — I739 Peripheral vascular disease, unspecified: Secondary | ICD-10-CM | POA: Diagnosis not present

## 2019-11-20 ENCOUNTER — Telehealth: Payer: Self-pay

## 2019-11-20 ENCOUNTER — Telehealth: Payer: Self-pay | Admitting: Internal Medicine

## 2019-11-20 DIAGNOSIS — L03114 Cellulitis of left upper limb: Secondary | ICD-10-CM | POA: Diagnosis not present

## 2019-11-20 DIAGNOSIS — M1990 Unspecified osteoarthritis, unspecified site: Secondary | ICD-10-CM | POA: Diagnosis not present

## 2019-11-20 DIAGNOSIS — D519 Vitamin B12 deficiency anemia, unspecified: Secondary | ICD-10-CM | POA: Diagnosis not present

## 2019-11-20 DIAGNOSIS — J9611 Chronic respiratory failure with hypoxia: Secondary | ICD-10-CM | POA: Diagnosis not present

## 2019-11-20 DIAGNOSIS — K219 Gastro-esophageal reflux disease without esophagitis: Secondary | ICD-10-CM | POA: Diagnosis not present

## 2019-11-20 DIAGNOSIS — S72141D Displaced intertrochanteric fracture of right femur, subsequent encounter for closed fracture with routine healing: Secondary | ICD-10-CM | POA: Diagnosis not present

## 2019-11-20 DIAGNOSIS — I10 Essential (primary) hypertension: Secondary | ICD-10-CM | POA: Diagnosis not present

## 2019-11-20 DIAGNOSIS — E785 Hyperlipidemia, unspecified: Secondary | ICD-10-CM | POA: Diagnosis not present

## 2019-11-20 DIAGNOSIS — I739 Peripheral vascular disease, unspecified: Secondary | ICD-10-CM | POA: Diagnosis not present

## 2019-11-20 NOTE — Telephone Encounter (Signed)
Patient nurse Santiago Glad called concerned about pt. Pt fell on right side today. When she arrived he had a bloody hand and was bruised up on his right side.  She said he will go to his appt to see podiatry for his infected heels Monday.   Will talk to pt about his mental status more, if he agrees to see a psychiatrist she will call and let us know so we can place referral.  CB# [phone]

## 2019-11-20 NOTE — Chronic Care Management (AMB) (Signed)
  Chronic Care Management   Outreach Note  11/20/2019 Name: [name] MRN: [id number] DOB: Mar 31, 1941  Alexander Alvarado is a 79 y.o. year old male who is a primary care patient of Glean Hess, MD. I reached out to Frutoso Chase by phone today in response to a referral sent by Mr. Artez Rigoni [facility] PCP, Dr. Halina Maidens     An unsuccessful telephone outreach was attempted today. The patient was referred to the case management team by for assistance with care management and care coordination.   Follow Up Plan: A HIPPA compliant phone message was left for the patient providing contact information and requesting a return call.  The care management team will reach out to the patient again over the next 7 days.  If patient returns call to provider office, please advise to call Embedded Care Management Care Guide Glenna Durand LPN at [phone]  Kyliyah Stirn, LPN Health Advisor, [place] Management ??[email or web address] ??[phone]

## 2019-11-23 DIAGNOSIS — B351 Tinea unguium: Secondary | ICD-10-CM | POA: Diagnosis not present

## 2019-11-23 DIAGNOSIS — Z89422 Acquired absence of other left toe(s): Secondary | ICD-10-CM | POA: Diagnosis not present

## 2019-11-23 DIAGNOSIS — I739 Peripheral vascular disease, unspecified: Secondary | ICD-10-CM | POA: Diagnosis not present

## 2019-11-23 DIAGNOSIS — L97412 Non-pressure chronic ulcer of right heel and midfoot with fat layer exposed: Secondary | ICD-10-CM | POA: Diagnosis not present

## 2019-11-23 DIAGNOSIS — L97422 Non-pressure chronic ulcer of left heel and midfoot with fat layer exposed: Secondary | ICD-10-CM | POA: Diagnosis not present

## 2019-11-24 DIAGNOSIS — I739 Peripheral vascular disease, unspecified: Secondary | ICD-10-CM | POA: Diagnosis not present

## 2019-11-24 DIAGNOSIS — K219 Gastro-esophageal reflux disease without esophagitis: Secondary | ICD-10-CM | POA: Diagnosis not present

## 2019-11-24 DIAGNOSIS — J9611 Chronic respiratory failure with hypoxia: Secondary | ICD-10-CM | POA: Diagnosis not present

## 2019-11-24 DIAGNOSIS — L03114 Cellulitis of left upper limb: Secondary | ICD-10-CM | POA: Diagnosis not present

## 2019-11-24 DIAGNOSIS — I10 Essential (primary) hypertension: Secondary | ICD-10-CM | POA: Diagnosis not present

## 2019-11-24 DIAGNOSIS — S72141D Displaced intertrochanteric fracture of right femur, subsequent encounter for closed fracture with routine healing: Secondary | ICD-10-CM | POA: Diagnosis not present

## 2019-11-24 DIAGNOSIS — D519 Vitamin B12 deficiency anemia, unspecified: Secondary | ICD-10-CM | POA: Diagnosis not present

## 2019-11-24 DIAGNOSIS — E785 Hyperlipidemia, unspecified: Secondary | ICD-10-CM | POA: Diagnosis not present

## 2019-11-24 DIAGNOSIS — M1990 Unspecified osteoarthritis, unspecified site: Secondary | ICD-10-CM | POA: Diagnosis not present

## 2019-11-25 DIAGNOSIS — D519 Vitamin B12 deficiency anemia, unspecified: Secondary | ICD-10-CM | POA: Diagnosis not present

## 2019-11-25 DIAGNOSIS — K219 Gastro-esophageal reflux disease without esophagitis: Secondary | ICD-10-CM | POA: Diagnosis not present

## 2019-11-25 DIAGNOSIS — S72141D Displaced intertrochanteric fracture of right femur, subsequent encounter for closed fracture with routine healing: Secondary | ICD-10-CM | POA: Diagnosis not present

## 2019-11-25 DIAGNOSIS — E785 Hyperlipidemia, unspecified: Secondary | ICD-10-CM | POA: Diagnosis not present

## 2019-11-25 DIAGNOSIS — J9611 Chronic respiratory failure with hypoxia: Secondary | ICD-10-CM | POA: Diagnosis not present

## 2019-11-25 DIAGNOSIS — I739 Peripheral vascular disease, unspecified: Secondary | ICD-10-CM | POA: Diagnosis not present

## 2019-11-25 DIAGNOSIS — M1990 Unspecified osteoarthritis, unspecified site: Secondary | ICD-10-CM | POA: Diagnosis not present

## 2019-11-25 DIAGNOSIS — I10 Essential (primary) hypertension: Secondary | ICD-10-CM | POA: Diagnosis not present

## 2019-11-25 DIAGNOSIS — L03114 Cellulitis of left upper limb: Secondary | ICD-10-CM | POA: Diagnosis not present

## 2019-11-26 DIAGNOSIS — L03114 Cellulitis of left upper limb: Secondary | ICD-10-CM | POA: Diagnosis not present

## 2019-11-26 DIAGNOSIS — S72141D Displaced intertrochanteric fracture of right femur, subsequent encounter for closed fracture with routine healing: Secondary | ICD-10-CM | POA: Diagnosis not present

## 2019-11-26 DIAGNOSIS — D519 Vitamin B12 deficiency anemia, unspecified: Secondary | ICD-10-CM | POA: Diagnosis not present

## 2019-11-26 DIAGNOSIS — E785 Hyperlipidemia, unspecified: Secondary | ICD-10-CM | POA: Diagnosis not present

## 2019-11-26 DIAGNOSIS — J9611 Chronic respiratory failure with hypoxia: Secondary | ICD-10-CM | POA: Diagnosis not present

## 2019-11-26 DIAGNOSIS — K219 Gastro-esophageal reflux disease without esophagitis: Secondary | ICD-10-CM | POA: Diagnosis not present

## 2019-11-26 DIAGNOSIS — M1990 Unspecified osteoarthritis, unspecified site: Secondary | ICD-10-CM | POA: Diagnosis not present

## 2019-11-26 DIAGNOSIS — I739 Peripheral vascular disease, unspecified: Secondary | ICD-10-CM | POA: Diagnosis not present

## 2019-11-26 DIAGNOSIS — I10 Essential (primary) hypertension: Secondary | ICD-10-CM | POA: Diagnosis not present

## 2019-11-27 DIAGNOSIS — L03114 Cellulitis of left upper limb: Secondary | ICD-10-CM | POA: Diagnosis not present

## 2019-11-27 DIAGNOSIS — I739 Peripheral vascular disease, unspecified: Secondary | ICD-10-CM | POA: Diagnosis not present

## 2019-11-27 DIAGNOSIS — M1990 Unspecified osteoarthritis, unspecified site: Secondary | ICD-10-CM | POA: Diagnosis not present

## 2019-11-27 DIAGNOSIS — E785 Hyperlipidemia, unspecified: Secondary | ICD-10-CM | POA: Diagnosis not present

## 2019-11-27 DIAGNOSIS — D519 Vitamin B12 deficiency anemia, unspecified: Secondary | ICD-10-CM | POA: Diagnosis not present

## 2019-11-27 DIAGNOSIS — I10 Essential (primary) hypertension: Secondary | ICD-10-CM | POA: Diagnosis not present

## 2019-11-27 DIAGNOSIS — J9611 Chronic respiratory failure with hypoxia: Secondary | ICD-10-CM | POA: Diagnosis not present

## 2019-11-27 DIAGNOSIS — K219 Gastro-esophageal reflux disease without esophagitis: Secondary | ICD-10-CM | POA: Diagnosis not present

## 2019-11-27 DIAGNOSIS — S72141D Displaced intertrochanteric fracture of right femur, subsequent encounter for closed fracture with routine healing: Secondary | ICD-10-CM | POA: Diagnosis not present

## 2019-11-27 NOTE — Chronic Care Management (AMB) (Signed)
  Chronic Care Management   Outreach Note  11/27/2019 Name: Alexander Alvarado MRN: [id number] DOB: 1940/12/15  [name] is a 79 y.o. year old male who is a primary care patient of Glean Hess, MD. I reached out to Frutoso Chase by phone today in response to a referral sent by Mr. Volney Buter [facility] PCP, Dr. Halina Maidens     A second unsuccessful telephone outreach was attempted today. The patient was referred to the case management team for assistance with care management and care coordination.   Follow Up Plan: The care management team will reach out to the patient again over the next 7 days.   Glenna Durand, LPN Health Advisor, Embedded Care Coordination [place] Care Management ??[email or web address] ??[phone]

## 2019-12-01 DIAGNOSIS — D519 Vitamin B12 deficiency anemia, unspecified: Secondary | ICD-10-CM | POA: Diagnosis not present

## 2019-12-01 DIAGNOSIS — I10 Essential (primary) hypertension: Secondary | ICD-10-CM | POA: Diagnosis not present

## 2019-12-01 DIAGNOSIS — M1990 Unspecified osteoarthritis, unspecified site: Secondary | ICD-10-CM | POA: Diagnosis not present

## 2019-12-01 DIAGNOSIS — J9611 Chronic respiratory failure with hypoxia: Secondary | ICD-10-CM | POA: Diagnosis not present

## 2019-12-01 DIAGNOSIS — K219 Gastro-esophageal reflux disease without esophagitis: Secondary | ICD-10-CM | POA: Diagnosis not present

## 2019-12-01 DIAGNOSIS — L03114 Cellulitis of left upper limb: Secondary | ICD-10-CM | POA: Diagnosis not present

## 2019-12-01 DIAGNOSIS — S72141D Displaced intertrochanteric fracture of right femur, subsequent encounter for closed fracture with routine healing: Secondary | ICD-10-CM | POA: Diagnosis not present

## 2019-12-01 DIAGNOSIS — I739 Peripheral vascular disease, unspecified: Secondary | ICD-10-CM | POA: Diagnosis not present

## 2019-12-01 DIAGNOSIS — E785 Hyperlipidemia, unspecified: Secondary | ICD-10-CM | POA: Diagnosis not present

## 2019-12-02 DIAGNOSIS — I10 Essential (primary) hypertension: Secondary | ICD-10-CM | POA: Diagnosis not present

## 2019-12-02 DIAGNOSIS — S72141D Displaced intertrochanteric fracture of right femur, subsequent encounter for closed fracture with routine healing: Secondary | ICD-10-CM | POA: Diagnosis not present

## 2019-12-02 DIAGNOSIS — L03114 Cellulitis of left upper limb: Secondary | ICD-10-CM | POA: Diagnosis not present

## 2019-12-02 DIAGNOSIS — I739 Peripheral vascular disease, unspecified: Secondary | ICD-10-CM | POA: Diagnosis not present

## 2019-12-02 DIAGNOSIS — J9611 Chronic respiratory failure with hypoxia: Secondary | ICD-10-CM | POA: Diagnosis not present

## 2019-12-02 DIAGNOSIS — D519 Vitamin B12 deficiency anemia, unspecified: Secondary | ICD-10-CM | POA: Diagnosis not present

## 2019-12-02 DIAGNOSIS — E785 Hyperlipidemia, unspecified: Secondary | ICD-10-CM | POA: Diagnosis not present

## 2019-12-02 DIAGNOSIS — M1990 Unspecified osteoarthritis, unspecified site: Secondary | ICD-10-CM | POA: Diagnosis not present

## 2019-12-02 DIAGNOSIS — K219 Gastro-esophageal reflux disease without esophagitis: Secondary | ICD-10-CM | POA: Diagnosis not present

## 2019-12-03 DIAGNOSIS — I739 Peripheral vascular disease, unspecified: Secondary | ICD-10-CM | POA: Diagnosis not present

## 2019-12-03 DIAGNOSIS — K219 Gastro-esophageal reflux disease without esophagitis: Secondary | ICD-10-CM | POA: Diagnosis not present

## 2019-12-03 DIAGNOSIS — E785 Hyperlipidemia, unspecified: Secondary | ICD-10-CM | POA: Diagnosis not present

## 2019-12-03 DIAGNOSIS — I10 Essential (primary) hypertension: Secondary | ICD-10-CM | POA: Diagnosis not present

## 2019-12-03 DIAGNOSIS — J9611 Chronic respiratory failure with hypoxia: Secondary | ICD-10-CM | POA: Diagnosis not present

## 2019-12-03 DIAGNOSIS — L03114 Cellulitis of left upper limb: Secondary | ICD-10-CM | POA: Diagnosis not present

## 2019-12-03 DIAGNOSIS — D519 Vitamin B12 deficiency anemia, unspecified: Secondary | ICD-10-CM | POA: Diagnosis not present

## 2019-12-03 DIAGNOSIS — M1990 Unspecified osteoarthritis, unspecified site: Secondary | ICD-10-CM | POA: Diagnosis not present

## 2019-12-03 DIAGNOSIS — S72141D Displaced intertrochanteric fracture of right femur, subsequent encounter for closed fracture with routine healing: Secondary | ICD-10-CM | POA: Diagnosis not present

## 2019-12-04 ENCOUNTER — Telehealth: Payer: Self-pay

## 2019-12-04 DIAGNOSIS — K219 Gastro-esophageal reflux disease without esophagitis: Secondary | ICD-10-CM | POA: Diagnosis not present

## 2019-12-04 DIAGNOSIS — E785 Hyperlipidemia, unspecified: Secondary | ICD-10-CM | POA: Diagnosis not present

## 2019-12-04 DIAGNOSIS — I739 Peripheral vascular disease, unspecified: Secondary | ICD-10-CM | POA: Diagnosis not present

## 2019-12-04 DIAGNOSIS — I10 Essential (primary) hypertension: Secondary | ICD-10-CM | POA: Diagnosis not present

## 2019-12-04 DIAGNOSIS — J9611 Chronic respiratory failure with hypoxia: Secondary | ICD-10-CM | POA: Diagnosis not present

## 2019-12-04 DIAGNOSIS — L03114 Cellulitis of left upper limb: Secondary | ICD-10-CM | POA: Diagnosis not present

## 2019-12-04 DIAGNOSIS — S72141D Displaced intertrochanteric fracture of right femur, subsequent encounter for closed fracture with routine healing: Secondary | ICD-10-CM | POA: Diagnosis not present

## 2019-12-04 DIAGNOSIS — D519 Vitamin B12 deficiency anemia, unspecified: Secondary | ICD-10-CM | POA: Diagnosis not present

## 2019-12-04 DIAGNOSIS — M1990 Unspecified osteoarthritis, unspecified site: Secondary | ICD-10-CM | POA: Diagnosis not present

## 2019-12-04 NOTE — Chronic Care Management (AMB) (Signed)
  Chronic Care Management   Outreach Note  12/04/2019 Name: [name] MRN: [id number] DOB: October 29, 1940  Alexander Alvarado is a 79 y.o. year old male who is a primary care patient of Glean Hess, MD. I reached out to Frutoso Chase by phone today in response to a referral sent by [name] [facility] PCP, Dr. Halina Maidens     Third unsuccessful telephone outreach was attempted today. The patient was referred to the case management team for assistance with care management and care coordination. The patient's primary care provider has been notified of our unsuccessful attempts to make or maintain contact with the patient. The care management team is pleased to engage with this patient at any time in the future should he/she be interested in assistance from the care management team.   Follow Up Plan: The care management team is available to follow up with the patient after provider conversation with the patient regarding recommendation for care management engagement and subsequent re-referral to the care management team.   Glenna Durand, LPN Health Advisor, [place] Management ??[email or web address] ??[phone]

## 2019-12-04 NOTE — Telephone Encounter (Signed)
Colletta Maryland from [facility] called saying patient needs verbal orders for 1 X a week for 3 weeks.   Called and gave verbal approval over her VM.  CB# [phone]

## 2019-12-08 DIAGNOSIS — D519 Vitamin B12 deficiency anemia, unspecified: Secondary | ICD-10-CM | POA: Diagnosis not present

## 2019-12-08 DIAGNOSIS — I10 Essential (primary) hypertension: Secondary | ICD-10-CM | POA: Diagnosis not present

## 2019-12-08 DIAGNOSIS — I739 Peripheral vascular disease, unspecified: Secondary | ICD-10-CM | POA: Diagnosis not present

## 2019-12-08 DIAGNOSIS — M1990 Unspecified osteoarthritis, unspecified site: Secondary | ICD-10-CM | POA: Diagnosis not present

## 2019-12-08 DIAGNOSIS — E785 Hyperlipidemia, unspecified: Secondary | ICD-10-CM | POA: Diagnosis not present

## 2019-12-08 DIAGNOSIS — S72141D Displaced intertrochanteric fracture of right femur, subsequent encounter for closed fracture with routine healing: Secondary | ICD-10-CM | POA: Diagnosis not present

## 2019-12-08 DIAGNOSIS — J9611 Chronic respiratory failure with hypoxia: Secondary | ICD-10-CM | POA: Diagnosis not present

## 2019-12-08 DIAGNOSIS — K219 Gastro-esophageal reflux disease without esophagitis: Secondary | ICD-10-CM | POA: Diagnosis not present

## 2019-12-08 DIAGNOSIS — L03114 Cellulitis of left upper limb: Secondary | ICD-10-CM | POA: Diagnosis not present

## 2019-12-09 DIAGNOSIS — I739 Peripheral vascular disease, unspecified: Secondary | ICD-10-CM | POA: Diagnosis not present

## 2019-12-09 DIAGNOSIS — S72141D Displaced intertrochanteric fracture of right femur, subsequent encounter for closed fracture with routine healing: Secondary | ICD-10-CM | POA: Diagnosis not present

## 2019-12-09 DIAGNOSIS — D519 Vitamin B12 deficiency anemia, unspecified: Secondary | ICD-10-CM | POA: Diagnosis not present

## 2019-12-09 DIAGNOSIS — J9611 Chronic respiratory failure with hypoxia: Secondary | ICD-10-CM | POA: Diagnosis not present

## 2019-12-09 DIAGNOSIS — M1990 Unspecified osteoarthritis, unspecified site: Secondary | ICD-10-CM | POA: Diagnosis not present

## 2019-12-09 DIAGNOSIS — K219 Gastro-esophageal reflux disease without esophagitis: Secondary | ICD-10-CM | POA: Diagnosis not present

## 2019-12-09 DIAGNOSIS — L03114 Cellulitis of left upper limb: Secondary | ICD-10-CM | POA: Diagnosis not present

## 2019-12-09 DIAGNOSIS — E785 Hyperlipidemia, unspecified: Secondary | ICD-10-CM | POA: Diagnosis not present

## 2019-12-09 DIAGNOSIS — I10 Essential (primary) hypertension: Secondary | ICD-10-CM | POA: Diagnosis not present

## 2019-12-14 DIAGNOSIS — D519 Vitamin B12 deficiency anemia, unspecified: Secondary | ICD-10-CM | POA: Diagnosis not present

## 2019-12-14 DIAGNOSIS — I739 Peripheral vascular disease, unspecified: Secondary | ICD-10-CM | POA: Diagnosis not present

## 2019-12-14 DIAGNOSIS — M1990 Unspecified osteoarthritis, unspecified site: Secondary | ICD-10-CM | POA: Diagnosis not present

## 2019-12-14 DIAGNOSIS — S72141D Displaced intertrochanteric fracture of right femur, subsequent encounter for closed fracture with routine healing: Secondary | ICD-10-CM | POA: Diagnosis not present

## 2019-12-14 DIAGNOSIS — I10 Essential (primary) hypertension: Secondary | ICD-10-CM | POA: Diagnosis not present

## 2019-12-14 DIAGNOSIS — E785 Hyperlipidemia, unspecified: Secondary | ICD-10-CM | POA: Diagnosis not present

## 2019-12-14 DIAGNOSIS — J9611 Chronic respiratory failure with hypoxia: Secondary | ICD-10-CM | POA: Diagnosis not present

## 2019-12-14 DIAGNOSIS — K219 Gastro-esophageal reflux disease without esophagitis: Secondary | ICD-10-CM | POA: Diagnosis not present

## 2019-12-14 DIAGNOSIS — L03114 Cellulitis of left upper limb: Secondary | ICD-10-CM | POA: Diagnosis not present

## 2019-12-15 DIAGNOSIS — E785 Hyperlipidemia, unspecified: Secondary | ICD-10-CM | POA: Diagnosis not present

## 2019-12-15 DIAGNOSIS — D519 Vitamin B12 deficiency anemia, unspecified: Secondary | ICD-10-CM | POA: Diagnosis not present

## 2019-12-15 DIAGNOSIS — I739 Peripheral vascular disease, unspecified: Secondary | ICD-10-CM | POA: Diagnosis not present

## 2019-12-15 DIAGNOSIS — J9611 Chronic respiratory failure with hypoxia: Secondary | ICD-10-CM | POA: Diagnosis not present

## 2019-12-15 DIAGNOSIS — L03114 Cellulitis of left upper limb: Secondary | ICD-10-CM | POA: Diagnosis not present

## 2019-12-15 DIAGNOSIS — I10 Essential (primary) hypertension: Secondary | ICD-10-CM | POA: Diagnosis not present

## 2019-12-15 DIAGNOSIS — S72141D Displaced intertrochanteric fracture of right femur, subsequent encounter for closed fracture with routine healing: Secondary | ICD-10-CM | POA: Diagnosis not present

## 2019-12-15 DIAGNOSIS — K219 Gastro-esophageal reflux disease without esophagitis: Secondary | ICD-10-CM | POA: Diagnosis not present

## 2019-12-15 DIAGNOSIS — M1990 Unspecified osteoarthritis, unspecified site: Secondary | ICD-10-CM | POA: Diagnosis not present

## 2019-12-18 DIAGNOSIS — I10 Essential (primary) hypertension: Secondary | ICD-10-CM | POA: Diagnosis not present

## 2019-12-18 DIAGNOSIS — J9611 Chronic respiratory failure with hypoxia: Secondary | ICD-10-CM | POA: Diagnosis not present

## 2019-12-18 DIAGNOSIS — E785 Hyperlipidemia, unspecified: Secondary | ICD-10-CM | POA: Diagnosis not present

## 2019-12-18 DIAGNOSIS — K219 Gastro-esophageal reflux disease without esophagitis: Secondary | ICD-10-CM | POA: Diagnosis not present

## 2019-12-18 DIAGNOSIS — M1990 Unspecified osteoarthritis, unspecified site: Secondary | ICD-10-CM | POA: Diagnosis not present

## 2019-12-18 DIAGNOSIS — L03114 Cellulitis of left upper limb: Secondary | ICD-10-CM | POA: Diagnosis not present

## 2019-12-18 DIAGNOSIS — S72141D Displaced intertrochanteric fracture of right femur, subsequent encounter for closed fracture with routine healing: Secondary | ICD-10-CM | POA: Diagnosis not present

## 2019-12-18 DIAGNOSIS — I739 Peripheral vascular disease, unspecified: Secondary | ICD-10-CM | POA: Diagnosis not present

## 2019-12-18 DIAGNOSIS — D519 Vitamin B12 deficiency anemia, unspecified: Secondary | ICD-10-CM | POA: Diagnosis not present

## 2019-12-22 DIAGNOSIS — D519 Vitamin B12 deficiency anemia, unspecified: Secondary | ICD-10-CM | POA: Diagnosis not present

## 2019-12-22 DIAGNOSIS — M1990 Unspecified osteoarthritis, unspecified site: Secondary | ICD-10-CM | POA: Diagnosis not present

## 2019-12-22 DIAGNOSIS — S72141D Displaced intertrochanteric fracture of right femur, subsequent encounter for closed fracture with routine healing: Secondary | ICD-10-CM | POA: Diagnosis not present

## 2019-12-22 DIAGNOSIS — J9611 Chronic respiratory failure with hypoxia: Secondary | ICD-10-CM | POA: Diagnosis not present

## 2019-12-22 DIAGNOSIS — E785 Hyperlipidemia, unspecified: Secondary | ICD-10-CM | POA: Diagnosis not present

## 2019-12-22 DIAGNOSIS — I10 Essential (primary) hypertension: Secondary | ICD-10-CM | POA: Diagnosis not present

## 2019-12-22 DIAGNOSIS — I739 Peripheral vascular disease, unspecified: Secondary | ICD-10-CM | POA: Diagnosis not present

## 2019-12-22 DIAGNOSIS — K219 Gastro-esophageal reflux disease without esophagitis: Secondary | ICD-10-CM | POA: Diagnosis not present

## 2019-12-22 DIAGNOSIS — L03114 Cellulitis of left upper limb: Secondary | ICD-10-CM | POA: Diagnosis not present

## 2019-12-29 ENCOUNTER — Other Ambulatory Visit: Payer: Self-pay | Admitting: Internal Medicine

## 2019-12-29 DIAGNOSIS — M19029 Primary osteoarthritis, unspecified elbow: Secondary | ICD-10-CM

## 2019-12-30 DIAGNOSIS — E785 Hyperlipidemia, unspecified: Secondary | ICD-10-CM | POA: Diagnosis not present

## 2019-12-30 DIAGNOSIS — S72141D Displaced intertrochanteric fracture of right femur, subsequent encounter for closed fracture with routine healing: Secondary | ICD-10-CM | POA: Diagnosis not present

## 2019-12-30 DIAGNOSIS — J9611 Chronic respiratory failure with hypoxia: Secondary | ICD-10-CM | POA: Diagnosis not present

## 2019-12-30 DIAGNOSIS — M1990 Unspecified osteoarthritis, unspecified site: Secondary | ICD-10-CM | POA: Diagnosis not present

## 2019-12-30 DIAGNOSIS — I739 Peripheral vascular disease, unspecified: Secondary | ICD-10-CM | POA: Diagnosis not present

## 2019-12-30 DIAGNOSIS — K219 Gastro-esophageal reflux disease without esophagitis: Secondary | ICD-10-CM | POA: Diagnosis not present

## 2019-12-30 DIAGNOSIS — L03114 Cellulitis of left upper limb: Secondary | ICD-10-CM | POA: Diagnosis not present

## 2019-12-30 DIAGNOSIS — D519 Vitamin B12 deficiency anemia, unspecified: Secondary | ICD-10-CM | POA: Diagnosis not present

## 2019-12-30 DIAGNOSIS — I10 Essential (primary) hypertension: Secondary | ICD-10-CM | POA: Diagnosis not present

## 2020-01-04 ENCOUNTER — Other Ambulatory Visit: Payer: Self-pay | Admitting: Internal Medicine

## 2020-01-04 DIAGNOSIS — E782 Mixed hyperlipidemia: Secondary | ICD-10-CM

## 2020-01-04 DIAGNOSIS — S93305D Unspecified dislocation of left foot, subsequent encounter: Secondary | ICD-10-CM

## 2020-01-04 DIAGNOSIS — K219 Gastro-esophageal reflux disease without esophagitis: Secondary | ICD-10-CM

## 2020-01-04 DIAGNOSIS — I10 Essential (primary) hypertension: Secondary | ICD-10-CM

## 2020-01-04 DIAGNOSIS — Z7982 Long term (current) use of aspirin: Secondary | ICD-10-CM

## 2020-01-04 DIAGNOSIS — E538 Deficiency of other specified B group vitamins: Secondary | ICD-10-CM

## 2020-01-04 DIAGNOSIS — F17200 Nicotine dependence, unspecified, uncomplicated: Secondary | ICD-10-CM

## 2020-01-04 DIAGNOSIS — I6350 Cerebral infarction due to unspecified occlusion or stenosis of unspecified cerebral artery: Secondary | ICD-10-CM

## 2020-01-04 DIAGNOSIS — I739 Peripheral vascular disease, unspecified: Secondary | ICD-10-CM

## 2020-01-04 DIAGNOSIS — Z8701 Personal history of pneumonia (recurrent): Secondary | ICD-10-CM

## 2020-01-04 DIAGNOSIS — Z9181 History of falling: Secondary | ICD-10-CM

## 2020-01-04 DIAGNOSIS — S72001S Fracture of unspecified part of neck of right femur, sequela: Secondary | ICD-10-CM

## 2020-01-04 DIAGNOSIS — S91302D Unspecified open wound, left foot, subsequent encounter: Secondary | ICD-10-CM

## 2020-01-04 NOTE — Progress Notes (Signed)
Received orders from [facility]. Start of care 11/05/19.   Orders from 01/04/20 through 03/03/20 are reviewed, signed and faxed.

## 2020-01-05 ENCOUNTER — Other Ambulatory Visit: Payer: Self-pay | Admitting: Internal Medicine

## 2020-01-05 DIAGNOSIS — M19029 Primary osteoarthritis, unspecified elbow: Secondary | ICD-10-CM

## 2020-01-06 ENCOUNTER — Other Ambulatory Visit: Payer: Self-pay

## 2020-01-07 DIAGNOSIS — S91302D Unspecified open wound, left foot, subsequent encounter: Secondary | ICD-10-CM | POA: Diagnosis not present

## 2020-01-07 DIAGNOSIS — S72141D Displaced intertrochanteric fracture of right femur, subsequent encounter for closed fracture with routine healing: Secondary | ICD-10-CM | POA: Diagnosis not present

## 2020-01-07 DIAGNOSIS — I10 Essential (primary) hypertension: Secondary | ICD-10-CM | POA: Diagnosis not present

## 2020-01-07 DIAGNOSIS — J9611 Chronic respiratory failure with hypoxia: Secondary | ICD-10-CM | POA: Diagnosis not present

## 2020-01-07 DIAGNOSIS — K219 Gastro-esophageal reflux disease without esophagitis: Secondary | ICD-10-CM | POA: Diagnosis not present

## 2020-01-07 DIAGNOSIS — M1990 Unspecified osteoarthritis, unspecified site: Secondary | ICD-10-CM | POA: Diagnosis not present

## 2020-01-07 DIAGNOSIS — E785 Hyperlipidemia, unspecified: Secondary | ICD-10-CM | POA: Diagnosis not present

## 2020-01-07 DIAGNOSIS — I739 Peripheral vascular disease, unspecified: Secondary | ICD-10-CM | POA: Diagnosis not present

## 2020-01-07 DIAGNOSIS — D519 Vitamin B12 deficiency anemia, unspecified: Secondary | ICD-10-CM | POA: Diagnosis not present

## 2020-01-13 DIAGNOSIS — S91302D Unspecified open wound, left foot, subsequent encounter: Secondary | ICD-10-CM | POA: Diagnosis not present

## 2020-01-13 DIAGNOSIS — I739 Peripheral vascular disease, unspecified: Secondary | ICD-10-CM | POA: Diagnosis not present

## 2020-01-13 DIAGNOSIS — E785 Hyperlipidemia, unspecified: Secondary | ICD-10-CM | POA: Diagnosis not present

## 2020-01-13 DIAGNOSIS — K219 Gastro-esophageal reflux disease without esophagitis: Secondary | ICD-10-CM | POA: Diagnosis not present

## 2020-01-13 DIAGNOSIS — I10 Essential (primary) hypertension: Secondary | ICD-10-CM | POA: Diagnosis not present

## 2020-01-13 DIAGNOSIS — S72141D Displaced intertrochanteric fracture of right femur, subsequent encounter for closed fracture with routine healing: Secondary | ICD-10-CM | POA: Diagnosis not present

## 2020-01-13 DIAGNOSIS — J9611 Chronic respiratory failure with hypoxia: Secondary | ICD-10-CM | POA: Diagnosis not present

## 2020-01-13 DIAGNOSIS — D519 Vitamin B12 deficiency anemia, unspecified: Secondary | ICD-10-CM | POA: Diagnosis not present

## 2020-01-13 DIAGNOSIS — M1990 Unspecified osteoarthritis, unspecified site: Secondary | ICD-10-CM | POA: Diagnosis not present

## 2020-01-18 ENCOUNTER — Emergency Department: Payer: Medicare HMO

## 2020-01-18 ENCOUNTER — Inpatient Hospital Stay
Admission: EM | Admit: 2020-01-18 | Discharge: 2020-02-01 | DRG: 252 | Disposition: A | Discharge status: Skilled Nursing Facility | Payer: Medicare HMO | Attending: Internal Medicine | Admitting: Internal Medicine

## 2020-01-18 ENCOUNTER — Other Ambulatory Visit: Payer: Self-pay

## 2020-01-18 ENCOUNTER — Encounter: Payer: Self-pay | Admitting: Emergency Medicine

## 2020-01-18 ENCOUNTER — Inpatient Hospital Stay: Payer: Medicare HMO

## 2020-01-18 DIAGNOSIS — Z7982 Long term (current) use of aspirin: Secondary | ICD-10-CM

## 2020-01-18 DIAGNOSIS — I6389 Other cerebral infarction: Secondary | ICD-10-CM | POA: Diagnosis not present

## 2020-01-18 DIAGNOSIS — I739 Peripheral vascular disease, unspecified: Secondary | ICD-10-CM

## 2020-01-18 DIAGNOSIS — K219 Gastro-esophageal reflux disease without esophagitis: Secondary | ICD-10-CM | POA: Diagnosis not present

## 2020-01-18 DIAGNOSIS — Z66 Do not resuscitate: Secondary | ICD-10-CM | POA: Diagnosis not present

## 2020-01-18 DIAGNOSIS — F329 Major depressive disorder, single episode, unspecified: Secondary | ICD-10-CM | POA: Diagnosis present

## 2020-01-18 DIAGNOSIS — Z20822 Contact with and (suspected) exposure to covid-19: Secondary | ICD-10-CM | POA: Diagnosis present

## 2020-01-18 DIAGNOSIS — Z22322 Carrier or suspected carrier of Methicillin resistant Staphylococcus aureus: Secondary | ICD-10-CM

## 2020-01-18 DIAGNOSIS — Z9049 Acquired absence of other specified parts of digestive tract: Secondary | ICD-10-CM

## 2020-01-18 DIAGNOSIS — Z716 Tobacco abuse counseling: Secondary | ICD-10-CM

## 2020-01-18 DIAGNOSIS — E440 Moderate protein-calorie malnutrition: Secondary | ICD-10-CM | POA: Diagnosis not present

## 2020-01-18 DIAGNOSIS — R531 Weakness: Secondary | ICD-10-CM | POA: Diagnosis not present

## 2020-01-18 DIAGNOSIS — E7800 Pure hypercholesterolemia, unspecified: Secondary | ICD-10-CM | POA: Diagnosis not present

## 2020-01-18 DIAGNOSIS — Z79899 Other long term (current) drug therapy: Secondary | ICD-10-CM

## 2020-01-18 DIAGNOSIS — Z96641 Presence of right artificial hip joint: Secondary | ICD-10-CM | POA: Diagnosis present

## 2020-01-18 DIAGNOSIS — R2681 Unsteadiness on feet: Secondary | ICD-10-CM | POA: Diagnosis not present

## 2020-01-18 DIAGNOSIS — J9621 Acute and chronic respiratory failure with hypoxia: Secondary | ICD-10-CM | POA: Diagnosis not present

## 2020-01-18 DIAGNOSIS — R609 Edema, unspecified: Secondary | ICD-10-CM | POA: Diagnosis not present

## 2020-01-18 DIAGNOSIS — G4709 Other insomnia: Secondary | ICD-10-CM | POA: Diagnosis present

## 2020-01-18 DIAGNOSIS — T40605A Adverse effect of unspecified narcotics, initial encounter: Secondary | ICD-10-CM | POA: Diagnosis not present

## 2020-01-18 DIAGNOSIS — R29898 Other symptoms and signs involving the musculoskeletal system: Secondary | ICD-10-CM | POA: Diagnosis not present

## 2020-01-18 DIAGNOSIS — L97422 Non-pressure chronic ulcer of left heel and midfoot with fat layer exposed: Secondary | ICD-10-CM | POA: Diagnosis not present

## 2020-01-18 DIAGNOSIS — R262 Difficulty in walking, not elsewhere classified: Secondary | ICD-10-CM | POA: Diagnosis not present

## 2020-01-18 DIAGNOSIS — Z4781 Encounter for orthopedic aftercare following surgical amputation: Secondary | ICD-10-CM | POA: Diagnosis not present

## 2020-01-18 DIAGNOSIS — R0902 Hypoxemia: Secondary | ICD-10-CM | POA: Diagnosis not present

## 2020-01-18 DIAGNOSIS — J449 Chronic obstructive pulmonary disease, unspecified: Secondary | ICD-10-CM | POA: Diagnosis present

## 2020-01-18 DIAGNOSIS — I10 Essential (primary) hypertension: Secondary | ICD-10-CM | POA: Diagnosis present

## 2020-01-18 DIAGNOSIS — I69354 Hemiplegia and hemiparesis following cerebral infarction affecting left non-dominant side: Secondary | ICD-10-CM | POA: Diagnosis not present

## 2020-01-18 DIAGNOSIS — S91105A Unspecified open wound of left lesser toe(s) without damage to nail, initial encounter: Secondary | ICD-10-CM | POA: Diagnosis not present

## 2020-01-18 DIAGNOSIS — I708 Atherosclerosis of other arteries: Secondary | ICD-10-CM | POA: Diagnosis present

## 2020-01-18 DIAGNOSIS — L97524 Non-pressure chronic ulcer of other part of left foot with necrosis of bone: Secondary | ICD-10-CM | POA: Diagnosis present

## 2020-01-18 DIAGNOSIS — Z89422 Acquired absence of other left toe(s): Secondary | ICD-10-CM

## 2020-01-18 DIAGNOSIS — I447 Left bundle-branch block, unspecified: Secondary | ICD-10-CM | POA: Diagnosis present

## 2020-01-18 DIAGNOSIS — F17210 Nicotine dependence, cigarettes, uncomplicated: Secondary | ICD-10-CM | POA: Diagnosis present

## 2020-01-18 DIAGNOSIS — I110 Hypertensive heart disease with heart failure: Secondary | ICD-10-CM | POA: Diagnosis not present

## 2020-01-18 DIAGNOSIS — R350 Frequency of micturition: Secondary | ICD-10-CM | POA: Diagnosis present

## 2020-01-18 DIAGNOSIS — M6281 Muscle weakness (generalized): Secondary | ICD-10-CM | POA: Diagnosis not present

## 2020-01-18 DIAGNOSIS — R7303 Prediabetes: Secondary | ICD-10-CM | POA: Diagnosis present

## 2020-01-18 DIAGNOSIS — M869 Osteomyelitis, unspecified: Secondary | ICD-10-CM

## 2020-01-18 DIAGNOSIS — Z7401 Bed confinement status: Secondary | ICD-10-CM | POA: Diagnosis not present

## 2020-01-18 DIAGNOSIS — S98912A Complete traumatic amputation of left foot, level unspecified, initial encounter: Secondary | ICD-10-CM | POA: Diagnosis not present

## 2020-01-18 DIAGNOSIS — I509 Heart failure, unspecified: Secondary | ICD-10-CM | POA: Diagnosis not present

## 2020-01-18 DIAGNOSIS — E1142 Type 2 diabetes mellitus with diabetic polyneuropathy: Secondary | ICD-10-CM | POA: Diagnosis not present

## 2020-01-18 DIAGNOSIS — I639 Cerebral infarction, unspecified: Secondary | ICD-10-CM | POA: Diagnosis not present

## 2020-01-18 DIAGNOSIS — I70262 Atherosclerosis of native arteries of extremities with gangrene, left leg: Principal | ICD-10-CM | POA: Diagnosis present

## 2020-01-18 DIAGNOSIS — B9562 Methicillin resistant Staphylococcus aureus infection as the cause of diseases classified elsewhere: Secondary | ICD-10-CM | POA: Diagnosis present

## 2020-01-18 DIAGNOSIS — E782 Mixed hyperlipidemia: Secondary | ICD-10-CM | POA: Diagnosis not present

## 2020-01-18 DIAGNOSIS — R3915 Urgency of urination: Secondary | ICD-10-CM | POA: Diagnosis present

## 2020-01-18 DIAGNOSIS — L97429 Non-pressure chronic ulcer of left heel and midfoot with unspecified severity: Secondary | ICD-10-CM | POA: Diagnosis not present

## 2020-01-18 DIAGNOSIS — G4700 Insomnia, unspecified: Secondary | ICD-10-CM | POA: Diagnosis present

## 2020-01-18 DIAGNOSIS — Z8249 Family history of ischemic heart disease and other diseases of the circulatory system: Secondary | ICD-10-CM

## 2020-01-18 DIAGNOSIS — Z09 Encounter for follow-up examination after completed treatment for conditions other than malignant neoplasm: Secondary | ICD-10-CM

## 2020-01-18 DIAGNOSIS — L97419 Non-pressure chronic ulcer of right heel and midfoot with unspecified severity: Secondary | ICD-10-CM | POA: Diagnosis not present

## 2020-01-18 DIAGNOSIS — M86172 Other acute osteomyelitis, left ankle and foot: Secondary | ICD-10-CM | POA: Diagnosis not present

## 2020-01-18 DIAGNOSIS — M2550 Pain in unspecified joint: Secondary | ICD-10-CM | POA: Diagnosis not present

## 2020-01-18 DIAGNOSIS — E1169 Type 2 diabetes mellitus with other specified complication: Secondary | ICD-10-CM | POA: Diagnosis not present

## 2020-01-18 DIAGNOSIS — Z6823 Body mass index (BMI) 23.0-23.9, adult: Secondary | ICD-10-CM

## 2020-01-18 DIAGNOSIS — G629 Polyneuropathy, unspecified: Secondary | ICD-10-CM | POA: Diagnosis present

## 2020-01-18 DIAGNOSIS — I96 Gangrene, not elsewhere classified: Secondary | ICD-10-CM | POA: Diagnosis not present

## 2020-01-18 DIAGNOSIS — Z87311 Personal history of (healed) other pathological fracture: Secondary | ICD-10-CM | POA: Diagnosis not present

## 2020-01-18 DIAGNOSIS — Z9981 Dependence on supplemental oxygen: Secondary | ICD-10-CM

## 2020-01-18 LAB — COMPREHENSIVE METABOLIC PANEL
ALT: 14 U/L (ref 0–44)
AST: 16 U/L (ref 15–41)
Albumin: 4 g/dL (ref 3.5–5.0)
Alkaline Phosphatase: 133 U/L — ABNORMAL HIGH (ref 38–126)
Anion gap: 11 (ref 5–15)
BUN: 11 mg/dL (ref 8–23)
CO2: 27 mmol/L (ref 22–32)
Calcium: 9.2 mg/dL (ref 8.9–10.3)
Chloride: 98 mmol/L (ref 98–111)
Creatinine, Ser: 0.61 mg/dL (ref 0.61–1.24)
GFR calc Af Amer: >60 mL/min (ref >60)
GFR calc non Af Amer: >60 mL/min (ref >60)
Glucose, Bld: 174 mg/dL — ABNORMAL HIGH (ref 70–99)
Potassium: 3.5 mmol/L (ref 3.5–5.1)
Sodium: 136 mmol/L (ref 135–145)
Total Bilirubin: 0.7 mg/dL (ref 0.3–1.2)
Total Protein: 7.7 g/dL (ref 6.5–8.1)

## 2020-01-18 LAB — CBC WITH DIFFERENTIAL/PLATELET
Abs Immature Granulocytes: 0.05 10*3/uL (ref 0.00–0.07)
Basophils Absolute: 0.1 10*3/uL (ref 0.0–0.1)
Basophils Relative: 0 %
Eosinophils Absolute: 0 10*3/uL (ref 0.0–0.5)
Eosinophils Relative: 0 %
HCT: 54.7 % — ABNORMAL HIGH (ref 39.0–52.0)
Hemoglobin: 18.2 g/dL — ABNORMAL HIGH (ref 13.0–17.0)
Immature Granulocytes: 0 %
Lymphocytes Relative: 17 %
Lymphs Abs: 2.4 10*3/uL (ref 0.7–4.0)
MCH: 30.7 pg (ref 26.0–34.0)
MCHC: 33.3 g/dL (ref 30.0–36.0)
MCV: 92.4 fL (ref 80.0–100.0)
Monocytes Absolute: 0.9 10*3/uL (ref 0.1–1.0)
Monocytes Relative: 7 %
Neutro Abs: 10.7 10*3/uL — ABNORMAL HIGH (ref 1.7–7.7)
Neutrophils Relative %: 76 %
Platelets: 279 10*3/uL (ref 150–400)
RBC: 5.92 MIL/uL — ABNORMAL HIGH (ref 4.22–5.81)
RDW: 13.6 % (ref 11.5–15.5)
WBC: 14.1 10*3/uL — ABNORMAL HIGH (ref 4.0–10.5)
nRBC: 0 % (ref 0.0–0.2)

## 2020-01-18 LAB — SEDIMENTATION RATE: Sed Rate: 7 mm/hr (ref 0–20)

## 2020-01-18 LAB — C-REACTIVE PROTEIN: CRP: 3.3 mg/dL — ABNORMAL HIGH (ref <1.0)

## 2020-01-18 LAB — LACTIC ACID, PLASMA: Lactic Acid, Venous: 1.9 mmol/L (ref 0.5–1.9)

## 2020-01-18 LAB — PREALBUMIN: Prealbumin: 12.6 mg/dL — ABNORMAL LOW (ref 18–38)

## 2020-01-18 MED ORDER — ACETAMINOPHEN 650 MG RE SUPP: 650.0000 mg | Freq: Four times a day (QID) | RECTAL | Status: DC | PRN

## 2020-01-18 MED ORDER — VANCOMYCIN HCL IN DEXTROSE 1-5 GM/200ML-% IV SOLN
1000.0000 mg | Freq: Once | INTRAVENOUS | Status: AC
  Administered 2020-01-18: 1000 mg via INTRAVENOUS
  Filled 2020-01-18: qty 200

## 2020-01-18 MED ORDER — ONDANSETRON HCL 4 MG PO TABS: 4.0000 mg | ORAL_TABLET | Freq: Four times a day (QID) | ORAL | Status: DC | PRN

## 2020-01-18 MED ORDER — METRONIDAZOLE 500 MG PO TABS
500.0000 mg | ORAL_TABLET | Freq: Three times a day (TID) | ORAL | Status: DC
  Administered 2020-01-19 – 2020-01-21 (×10): 500 mg via ORAL
  Filled 2020-01-18 (×9): qty 1

## 2020-01-18 MED ORDER — ATORVASTATIN CALCIUM 20 MG PO TABS
20.0000 mg | ORAL_TABLET | Freq: Every day | ORAL | Status: DC
  Administered 2020-01-18 – 2020-01-31 (×14): 20 mg via ORAL
  Filled 2020-01-18 (×13): qty 1
  Filled 2020-01-18: qty 2

## 2020-01-18 MED ORDER — ONDANSETRON HCL 4 MG/2ML IJ SOLN: 4.0000 mg | Freq: Four times a day (QID) | INTRAMUSCULAR | Status: DC | PRN

## 2020-01-18 MED ORDER — ASPIRIN EC 81 MG PO TBEC
81.0000 mg | DELAYED_RELEASE_TABLET | Freq: Every day | ORAL | Status: DC
  Administered 2020-01-19 – 2020-02-01 (×12): 81 mg via ORAL
  Filled 2020-01-18 (×12): qty 1

## 2020-01-18 MED ORDER — VANCOMYCIN HCL 1750 MG/350ML IV SOLN
1750.0000 mg | INTRAVENOUS | Status: DC
  Administered 2020-01-20 – 2020-01-24 (×5): 1750 mg via INTRAVENOUS
  Filled 2020-01-18 (×6): qty 350

## 2020-01-18 MED ORDER — ENOXAPARIN SODIUM 40 MG/0.4ML ~~LOC~~ SOLN
40.0000 mg | SUBCUTANEOUS | Status: DC
  Administered 2020-01-18 – 2020-01-20 (×3): 40 mg via SUBCUTANEOUS
  Filled 2020-01-18 (×3): qty 0.4

## 2020-01-18 MED ORDER — KETOROLAC TROMETHAMINE 15 MG/ML IJ SOLN
15.0000 mg | Freq: Four times a day (QID) | INTRAMUSCULAR | Status: AC | PRN
  Administered 2020-01-18 – 2020-01-23 (×8): 15 mg via INTRAVENOUS
  Filled 2020-01-18 (×10): qty 1

## 2020-01-18 MED ORDER — ASPIRIN 81 MG PO CHEW
324.0000 mg | CHEWABLE_TABLET | Freq: Once | ORAL | Status: AC
  Administered 2020-01-18: 324 mg via ORAL
  Filled 2020-01-18: qty 4

## 2020-01-18 MED ORDER — POLYETHYLENE GLYCOL 3350 17 G PO PACK
17.0000 g | PACK | Freq: Every day | ORAL | Status: DC | PRN
  Administered 2020-01-20: 17 g via ORAL
  Filled 2020-01-18: qty 1

## 2020-01-18 MED ORDER — SODIUM CHLORIDE 0.9% FLUSH
3.0000 mL | Freq: Two times a day (BID) | INTRAVENOUS | Status: DC
  Administered 2020-01-18 – 2020-01-27 (×11): 3 mL via INTRAVENOUS

## 2020-01-18 MED ORDER — ACETAMINOPHEN 325 MG PO TABS
650.0000 mg | ORAL_TABLET | Freq: Four times a day (QID) | ORAL | Status: DC | PRN
  Administered 2020-01-18 – 2020-01-21 (×4): 650 mg via ORAL
  Filled 2020-01-18 (×4): qty 2

## 2020-01-18 MED ORDER — AMLODIPINE BESYLATE 10 MG PO TABS
10.0000 mg | ORAL_TABLET | Freq: Every day | ORAL | Status: DC
  Administered 2020-01-19 – 2020-02-01 (×12): 10 mg via ORAL
  Filled 2020-01-18 (×12): qty 1

## 2020-01-18 MED ORDER — PIPERACILLIN-TAZOBACTAM 3.375 G IVPB 30 MIN
3.3750 g | Freq: Once | INTRAVENOUS | Status: AC
  Administered 2020-01-18: 3.375 g via INTRAVENOUS
  Filled 2020-01-18: qty 50

## 2020-01-18 MED ORDER — SODIUM CHLORIDE 0.9 % IV SOLN
2.0000 g | INTRAVENOUS | Status: DC
  Administered 2020-01-18 – 2020-01-21 (×4): 2 g via INTRAVENOUS
  Filled 2020-01-18: qty 2
  Filled 2020-01-18: qty 20
  Filled 2020-01-18: qty 2
  Filled 2020-01-18: qty 20
  Filled 2020-01-18: qty 2

## 2020-01-18 MED ORDER — PANTOPRAZOLE SODIUM 40 MG PO TBEC
40.0000 mg | DELAYED_RELEASE_TABLET | Freq: Every day | ORAL | Status: DC
  Administered 2020-01-19 – 2020-02-01 (×12): 40 mg via ORAL
  Filled 2020-01-18 (×12): qty 1

## 2020-01-18 NOTE — ED Triage Notes (Signed)
Pt in via POV, with diabetic ulcers to left third toe and left heel, significant swelling and redness noted to left foot and ankle.  Vitals WDL, NAD noted at this time.

## 2020-01-18 NOTE — ED Notes (Signed)
Pt taken to CT.

## 2020-01-18 NOTE — ED Notes (Signed)
Per MD Jessup hold antibiotics until swab wound.  No IV access at this time. Will get another RN to try.

## 2020-01-18 NOTE — ED Notes (Signed)
Waiting on wound cultures from lab to be sent

## 2020-01-18 NOTE — ED Provider Notes (Signed)
[facility] Emergency Department Provider Note   ____________________________________________   First MD Initiated Contact with Patient 01/18/20 1610     (approximate)  I have reviewed the triage vital signs and the nursing notes.   HISTORY  Chief Complaint Cellulitis    HPI Alexander Alvarado is a 79 y.o. male with past medical history of stroke, hypertension, and peripheral vascular disease who presents to the ED complaining of weakness and toe wound.  Patient reports that he has noticed a wound to the dorsal portion of his left third toe as well as his left heel for the past 2 to 3 months.  Wound has looked progressively worse with some purulent drainage as well as increased swelling over the dorsum of his left foot.  He denies any history of diabetes, but reports having multiple vascular interventions to his left leg with prior amputation of left second toe.  Patient also complains of left-sided weakness since waking up this morning.  He states that when he went to bed last night that he felt fine, but when he woke up this morning at 4 AM he felt his left arm and left leg were weak.  He states he has been unable to walk with his left leg or use his left arm.  He does have a history of stroke on the left side, but states current weakness is a new change.  He has not had any fevers, cough, chest pain, shortness of breath, vomiting, or diarrhea.        Past Medical History:  Diagnosis Date  . Depression   . GERD (gastroesophageal reflux disease)   . High cholesterol   . Hypertension   . PVD (peripheral vascular disease) ([place])   . Stroke [facility])    left side weakness    Patient Active Problem List   Diagnosis Date Noted  . Closed hip fracture requiring operative repair, right, sequela   . Acute on chronic respiratory failure with hypoxia ([place])   . Aspiration pneumonia of right lower lobe due to gastric secretions ([place])   . Tachycardia   . Femur fracture,  right ([place]) 09/10/2019  . Amputation of toe of left foot ([place]) 02/10/2019  . Pre-diabetes 01/07/2017  . B12 nutritional deficiency 12/07/2016  . Folic acid deficiency [id number]  . Cerebrovascular accident (CVA) due to occlusion of cerebral artery ([place]) 12/30/2015  . Senile ecchymosis 12/30/2015  . Essential hypertension 03/12/2015  . Gastro-esophageal reflux disease without esophagitis 03/12/2015  . Hyperlipidemia, mixed 03/12/2015  . Insomnia, persistent 03/12/2015  . Neoplasm of uncertain behavior of skin of hand 03/12/2015  . Arthritis, degenerative 03/12/2015  . Peripheral vascular disease ([place]) 03/12/2015  . Compulsive tobacco user syndrome 03/12/2015  . H/O alcohol abuse 10/29/2012    Past Surgical History:  Procedure Laterality Date  . AMPUTATION Left 08/31/2015   Procedure: AMPUTATION DIGIT left 2nd toe ;  Surgeon: Samara Deist, DPM;  Location: [place];  Service: Podiatry;  Laterality: Left;  . AMPUTATION TOE Left 08/2015   second toe  . CHOLECYSTECTOMY    . HIP ARTHROPLASTY Right 09/11/2019   Procedure: ARTHROPLASTY BIPOLAR HIP (HEMIARTHROPLASTY);  Surgeon: Leim Fabry, MD;  Location: [facility];  Service: Orthopedics;  Laterality: Right;  . LUMBAR DISC SURGERY    . VASCULAR SURGERY Left 2011   x 5 to left leg    Prior to Admission medications   Medication Sig Start Date End Date Taking? Authorizing Provider  acetaminophen (TYLENOL) 325 MG tablet Take  2 tablets (650 mg total) by mouth every 6 (six) hours as needed for mild pain. 09/16/19 09/15/20  Loletha Grayer, MD  amLODipine (NORVASC) 10 MG tablet Take 1 tablet (10 mg total) by mouth daily. 11/13/19   Glean Hess, MD  aspirin 81 MG tablet Take 81 mg by mouth daily.     [provider]  atorvastatin (LIPITOR) 20 MG tablet TAKE 1 TABLET BY MOUTH AT BEDTIME 09/24/19   Glean Hess, MD  cyanocobalamin 1000 MCG tablet Take 1,000 mcg by mouth daily.     [provider]  folic acid  (FOLVITE) 1 MG tablet Take 1 mg by mouth daily.     [provider]  hydrALAZINE (APRESOLINE) 25 MG tablet Take 1 tablet (25 mg total) by mouth 2 (two) times daily. 11/13/19   Glean Hess, MD  metoprolol tartrate (LOPRESSOR) 25 MG tablet Take 1 tablet (25 mg total) by mouth 2 (two) times daily. 11/13/19   Glean Hess, MD  omeprazole (PRILOSEC) 40 MG capsule Take 1 capsule (40 mg total) by mouth daily. 11/13/19   Glean Hess, MD  oxyCODONE (OXY IR/ROXICODONE) 5 MG immediate release tablet Take 1 tablet (5 mg total) by mouth every 3 (three) hours as needed for moderate pain (pain score 4-6). 09/13/19   Reche Dixon, PA-C  quinapril (ACCUPRIL) 40 MG tablet Take 1 tablet (40 mg total) by mouth daily. 11/13/19   Glean Hess, MD    Allergies Patient has no known allergies.  Family History  Problem Relation Age of Onset  . CAD Mother     Social History Social History   Tobacco Use  . Smoking status: Current Every Day Smoker    Packs/day: 0.50    Types: Cigarettes  . Smokeless tobacco: Never Used  . Tobacco comment: 3-4 ciggs daily  Substance Use Topics  . Alcohol use: No    Alcohol/week: 0.0 standard drinks  . Drug use: No    Review of Systems  Constitutional: No fever/chills Eyes: No visual changes. ENT: No sore throat. Cardiovascular: Denies chest pain. Respiratory: Denies shortness of breath. Gastrointestinal: No abdominal pain.  No nausea, no vomiting.  No diarrhea.  No constipation. Genitourinary: Negative for dysuria. Musculoskeletal: Negative for back pain. Skin: Negative for rash.  Positive for foot wound. Neurological: Negative for headaches.  Positive for left-sided weakness.  ____________________________________________   PHYSICAL EXAM:  VITAL SIGNS: ED Triage Vitals  Enc Vitals Group     BP 01/18/20 1243 (!) 159/69     Pulse Rate 01/18/20 1243 63     Resp 01/18/20 1243 16     Temp 01/18/20 1243 98.3 F (36.8 C)     Temp Source  01/18/20 1243 Oral     SpO2 01/18/20 1243 94 %     Weight 01/18/20 1244 150 lb (68 kg)     Height 01/18/20 1244 5\' 10"  (1.778 m)     Head Circumference --      [facility] --      Pain Score 01/18/20 1243 8     Pain Loc --      Pain Edu? --      Excl. in [place]? --     Constitutional: Alert and oriented. Eyes: Conjunctivae are normal. Head: Atraumatic. Nose: No congestion/rhinnorhea. Mouth/Throat: Mucous membranes are moist. Neck: Normal ROM Cardiovascular: Normal rate, regular rhythm. Grossly normal heart sounds.  1+ DP pulses bilaterally. Respiratory: Normal respiratory effort.  No retractions. Lungs CTAB. Gastrointestinal: Soft and  nontender. No distention. Genitourinary: deferred Musculoskeletal: No lower extremity tenderness.  Wound to the dorsum of left third toe with purulent drainage, additional left heel wound without any apparent drainage.  Erythema and edema to the dorsum of the left foot. Neurologic:  Normal speech and language.  4 out of 5 strength in left upper and left lower extremities with positive pronator drift on left. Skin:  Skin is warm and dry. Psychiatric: Mood and affect are normal. Speech and behavior are normal.  ____________________________________________   LABS (all labs ordered are listed, but only abnormal results are displayed)  Labs Reviewed  COMPREHENSIVE METABOLIC PANEL - Abnormal; Notable for the following components:      Result Value   Glucose, Bld 174 (*)    Alkaline Phosphatase 133 (*)    All other components within normal limits  CBC WITH DIFFERENTIAL/PLATELET - Abnormal; Notable for the following components:   WBC 14.1 (*)    RBC 5.92 (*)    Hemoglobin 18.2 (*)    HCT 54.7 (*)    Neutro Abs 10.7 (*)    All other components within normal limits  SARS CORONAVIRUS 2 (TAT 6-24 HRS)  LACTIC ACID, PLASMA   ____________________________________________  EKG  ED ECG REPORT I, Blake Divine, the attending physician, personally viewed  and interpreted this ECG.   Date: 01/18/2020  EKG Time: 17:11  Rate: 55  Rhythm: normal sinus rhythm  Axis: Normal  Intervals:left bundle branch block  ST&T Change: None, negative sgarbossa   PROCEDURES  Procedure(s) performed (including Critical Care):  Procedures   ____________________________________________   INITIAL IMPRESSION / ASSESSMENT AND PLAN / ED COURSE       79 year old male with past medical history of hypertension, stroke, and peripheral vascular disease who presents to the ED complaining of left-sided weakness since waking up this morning as well as greater than 2 months of worsening wound to left second toe.  He does seem to have some subtle weakness in his left side with no apparent cranial nerve deficits, last known well occurring when he went to bed last night and therefore he is not a candidate for TPA.  I doubt large vessel occlusion given no focal neurologic findings other than subtle left-sided weakness.  CT head is negative for acute process and patient given initial dose of aspirin.  EKG shows normal sinus rhythm with left bundle branch block similar to prior.  He also has concerning appearing wound to left second toe, and while he has diminished pulses, his lower extremities currently appear warm and well perfused.  X-ray is concerning for osteomyelitis to his third toe on the left and we will start on Vanco and Zosyn.  He will require admission for further treatment of osteomyelitis and stroke work-up.  CT head is negative for acute process.  Case discussed with hospitalist, who requested that I place order for neurology consult in the morning, which was done.  EKG shows patient's known left bundle branch block with no evidence of acute ischemia or arrhythmia.      ____________________________________________   FINAL CLINICAL IMPRESSION(S) / ED DIAGNOSES  Final diagnoses:  Osteomyelitis of left foot, unspecified type ([place])  Left-sided weakness      ED Discharge Orders    None       Note:  This document was prepared using Dragon voice recognition software and may include unintentional dictation errors.   Blake Divine, MD 01/18/20 1736

## 2020-01-18 NOTE — Progress Notes (Signed)
Report given to [facility]. Pt off to R 149

## 2020-01-18 NOTE — H&P (Signed)
History and Physical    [name] [id number] DOB: May 16, 1941 DOA: 01/18/2020  PCP: Glean Hess, MD   Patient coming from: Home  I have personally briefly reviewed patient's old medical records in [place]  Chief Complaint: Left-sided weakness.  HPI: Alexander Alvarado is a 79 y.o. male with medical history significant of hypertension, peripheral vascular disease s/p amputation of second toe and CVA  Brought to ED with concern of left-sided weakness which started this morning.  Per patient he has some residual weakness on left side but when he got up this morning he was unable to move his left side.  He normally walks with the help of walker since his prior stroke, he could not grasp a cup or walk .  His symptoms started gradually improving as the day passes.  He denies any facial droop, denies any change in his vision.  He has left-sided drooping eyelid for a long time.  He was also complaining of worsening pain in his left foot.  He has chronic wounds in his left third toe and heel.  Apparently seen by in wound care nurse at home and getting treatment with no relief.  He denies using any antibiotics or seen a physician for that.  He has his second toe amputated due to osteo in the past.  He denies any fever or chills.  No upper respiratory symptoms.  He lives alone and denies any sick contacts.  He was also complaining of some increased urinary urgency and frequency.  Denies any dysuria, hematuria or lower abdominal pain.  ED Course: Hemodynamically stable, elevated blood pressure at 171/81, CT head negative for any acute findings, also found to have a foul-smelling foot ulcer with radiologic evidence of osteomyelitis of third toe.  Neurology and podiatry was consulted.  Review of Systems: As per HPI otherwise 10 point review of systems negative.   Past Medical History:  Diagnosis Date  . Depression   . GERD (gastroesophageal reflux disease)   . High cholesterol   .  Hypertension   . PVD (peripheral vascular disease) ([place])   . Stroke [facility])    left side weakness    Past Surgical History:  Procedure Laterality Date  . AMPUTATION Left 08/31/2015   Procedure: AMPUTATION DIGIT left 2nd toe ;  Surgeon: Samara Deist, DPM;  Location: [facility];  Service: Podiatry;  Laterality: Left;  . AMPUTATION TOE Left 08/2015   second toe  . CHOLECYSTECTOMY    . HIP ARTHROPLASTY Right 09/11/2019   Procedure: ARTHROPLASTY BIPOLAR HIP (HEMIARTHROPLASTY);  Surgeon: Leim Fabry, MD;  Location: [facility];  Service: Orthopedics;  Laterality: Right;  . LUMBAR DISC SURGERY    . VASCULAR SURGERY Left 2011   x 5 to left leg     reports that he has been smoking cigarettes. He has been smoking about 0.50 packs per day. He has never used smokeless tobacco. He reports that he does not drink alcohol or use drugs.  No Known Allergies  Family History  Problem Relation Age of Onset  . CAD Mother     Prior to Admission medications   Medication Sig Start Date End Date Taking? Authorizing Provider  acetaminophen (TYLENOL) 325 MG tablet Take 2 tablets (650 mg total) by mouth every 6 (six) hours as needed for mild pain. 09/16/19 09/15/20  Loletha Grayer, MD  amLODipine (NORVASC) 10 MG tablet Take 1 tablet (10 mg total) by mouth daily. 11/13/19   Glean Hess, MD  aspirin  81 MG tablet Take 81 mg by mouth daily.     [provider]  atorvastatin (LIPITOR) 20 MG tablet TAKE 1 TABLET BY MOUTH AT BEDTIME 09/24/19   Glean Hess, MD  cyanocobalamin 1000 MCG tablet Take 1,000 mcg by mouth daily.     [provider]  folic acid (FOLVITE) 1 MG tablet Take 1 mg by mouth daily.     [provider]  hydrALAZINE (APRESOLINE) 25 MG tablet Take 1 tablet (25 mg total) by mouth 2 (two) times daily. 11/13/19   Glean Hess, MD  metoprolol tartrate (LOPRESSOR) 25 MG tablet Take 1 tablet (25 mg total) by mouth 2 (two) times daily. 11/13/19   Glean Hess, MD  omeprazole (PRILOSEC) 40 MG capsule Take 1 capsule (40 mg total) by mouth daily. 11/13/19   Glean Hess, MD  oxyCODONE (OXY IR/ROXICODONE) 5 MG immediate release tablet Take 1 tablet (5 mg total) by mouth every 3 (three) hours as needed for moderate pain (pain score 4-6). 09/13/19   Reche Dixon, PA-C  quinapril (ACCUPRIL) 40 MG tablet Take 1 tablet (40 mg total) by mouth daily. 11/13/19   Glean Hess, MD    Physical Exam: Vitals:   01/18/20 1715 01/18/20 1730 01/18/20 1745 01/18/20 1800  BP:  (!) 154/73  (!) 155/73  Pulse:  61 (!) 57 60  Resp: 19 (!) 24 17 15   Temp:      TempSrc:      SpO2:  91% 92% (!) 88%  Weight:      Height:        General: Vital signs reviewed.  Patient is well-developed and well-nourished, in no acute distress and cooperative with exam.  Head: Normocephalic and atraumatic. Eyes: EOMI, conjunctivae normal, no scleral icterus.  ENMT: Mucous membranes are moist. Neck: Supple, trachea midline, normal ROM, no JVD, masses, thyromegaly, or carotid bruit present.  Cardiovascular: RRR, S1 normal, S2 normal, no murmurs, gallops, or rubs. Pulmonary/Chest: Clear to auscultation bilaterally, no wheezes, rales, or rhonchi. Abdominal: Soft, non-tender, non-distended, BS +, no masses, organomegaly, or guarding present.  Extremities: Left foot with a small ulcer of the third toe and a ulcer at left heel surrounded by edema and erythema. Neurological: A&O x3, Strength is 5/5 on right and 4+/5 on left upper and lower extremities, cranial nerve II-XII are grossly intact, sensory intact to light touch bilaterally.  Skin: Warm, dry and intact. No rashes or erythema. Psychiatric: Normal mood and affect. speech and behavior is normal. Cognition and memory are normal.         Labs on Admission: I have personally reviewed following labs and imaging studies  CBC: Recent Labs  Lab 01/18/20 1246  WBC 14.1*  NEUTROABS 10.7*  HGB 18.2*  HCT 54.7*  MCV  92.4  PLT 123XX123   Basic Metabolic Panel: Recent Labs  Lab 01/18/20 1246  NA 136  K 3.5  CL 98  CO2 27  GLUCOSE 174*  BUN 11  CREATININE 0.61  CALCIUM 9.2   GFR: Estimated Creatinine Clearance: 75.6 mL/min (by C-G formula based on SCr of 0.61 mg/dL). Liver Function Tests: Recent Labs  Lab 01/18/20 1246  AST 16  ALT 14  ALKPHOS 133*  BILITOT 0.7  PROT 7.7  ALBUMIN 4.0   No results for input(s): LIPASE, AMYLASE in the last 168 hours. No results for input(s): AMMONIA in the last 168 hours. Coagulation Profile: No results for input(s): INR, PROTIME in the last 168 hours. Cardiac  Enzymes: No results for input(s): CKTOTAL, CKMB, CKMBINDEX, TROPONINI in the last 168 hours. BNP (last 3 results) No results for input(s): PROBNP in the last 8760 hours. HbA1C: No results for input(s): HGBA1C in the last 72 hours. CBG: No results for input(s): GLUCAP in the last 168 hours. Lipid Profile: No results for input(s): CHOL, HDL, LDLCALC, TRIG, CHOLHDL, LDLDIRECT in the last 72 hours. Thyroid Function Tests: No results for input(s): TSH, T4TOTAL, FREET4, T3FREE, THYROIDAB in the last 72 hours. Anemia Panel: No results for input(s): VITAMINB12, FOLATE, FERRITIN, TIBC, IRON, RETICCTPCT in the last 72 hours. Urine analysis:    Component Value Date/Time   COLORURINE Yellow 07/14/2012 2232   APPEARANCEUR Hazy 07/14/2012 2232   LABSPEC 1.024 07/14/2012 2232   PHURINE 6.0 07/14/2012 2232   GLUCOSEU Negative 07/14/2012 2232   HGBUR Negative 07/14/2012 2232   BILIRUBINUR neg 02/10/2019 0847   BILIRUBINUR Negative 07/14/2012 2232   KETONESUR 1+ 07/14/2012 2232   PROTEINUR Negative 02/10/2019 0847   PROTEINUR Negative 07/14/2012 2232   UROBILINOGEN 0.2 02/10/2019 0847   NITRITE neg 02/10/2019 0847   NITRITE Negative 07/14/2012 2232   LEUKOCYTESUR Negative 02/10/2019 0847   LEUKOCYTESUR Negative 07/14/2012 2232    Radiological Exams on Admission: CT Head Wo Contrast  Result Date:  01/18/2020 CLINICAL DATA:  Left-sided weakness EXAM: CT HEAD WITHOUT CONTRAST TECHNIQUE: Contiguous axial images were obtained from the base of the skull through the vertex without intravenous contrast. COMPARISON:  CT brain 09/10/2019 FINDINGS: Brain: No acute territorial infarction, hemorrhage or intracranial mass. Moderate atrophy. Chronic infarcts in the left cerebellum, right greater than left basal ganglia, and right white matter. Hypodensity in the white matter consistent with chronic small vessel ischemic change. Stable ventricular size with mild ex vacuo dilatation of right frontal horn. Vascular: No hyperdense vessels. Vertebral and carotid vascular calcification. Skull: Normal. Negative for fracture or focal lesion. Sinuses/Orbits: No acute finding. Other: None IMPRESSION: 1. No CT evidence for acute intracranial abnormality. 2. Atrophy and chronic small vessel ischemic changes of the white matter. Chronic multifocal lacunar infarcts. Electronically Signed   By: Donavan Foil M.D.   On: 01/18/2020 17:02   DG Foot Complete Left  Result Date: 01/18/2020 CLINICAL DATA:  Dorsal left third toe wound. Neuropathy EXAM: LEFT FOOT - COMPLETE 3+ VIEW COMPARISON:  None. FINDINGS: Prior second digit amputation at the proximal phalanx. Cortical irregularity involving the dorsal cortex of the third digit proximal phalanx distally. There is a hammertoe deformity involving the third digit. No fracture is seen. No dislocation. Soft tissue swelling without evidence of soft tissue gas. IMPRESSION: Cortical irregularity involving the dorsal cortex of the third digit proximal phalanx raising the suspicion for osteomyelitis in the setting of dorsal skin wound. If further imaging is clinically warranted, an MRI could be performed. Electronically Signed   By: Davina Poke D.O.   On: 01/18/2020 14:00    EKG: Independently reviewed.  Sinus rhythm with prolonged PR interval.  Assessment/Plan Active Problems:    Osteomyelitis of third toe of left foot (HCC)   Left sided weakness.  Patient with history of prior stroke.  Initial CT head without any acute findings. Complete stroke work-up with -MRI of brain -MRA of head and neck. -Lipid profile -A1c -PT/OT evaluation. -Frequent neuro checks.  Left foot wound with osteomyelitis.  Patient has chronic wounds with radiologic evidence of osteomyelitis.  According to the advice of Dr. Luana Shu we will get wound cultures and then start him on antibiotics. -Get wound culture. -Start  him on vancomycin, ceftriaxone and p.o. metronidazole per lower extremity wound protocol for moderate wound after cultures were sent. -Get ABI -Podiatrist Dr. Luana Shu was consulted and he will see him tomorrow.  Hypertension.  Blood pressure mildly elevated.  On multiple antihypertensives at home.  We will restart home antihypertensives after MRI to rule out stroke.  Increased urinary urgency and frequency. -Check UA -Check A1c  DVT prophylaxis: Lovenox Code Status: DNR Family Communication: No family at bedside. Disposition Plan: Pending improvement in work-up. Consults called: Neurology and podiatry. Admission status: Inpatient   Lorella Nimrod MD [facility]  If 7PM-7AM, please contact night-coverage [email or web address]  01/18/2020, 6:36 PM   This record has been created using Dragon voice recognition software. Errors have been sought and corrected,but may not always be located. Such creation errors do not reflect on the standard of care.

## 2020-01-18 NOTE — ED Triage Notes (Signed)
Pt comes into the ED via EMS from home with c/o weakness on the left side since he woke up this morning. Pt also has foul looking wound to the left 3rd toe with redness and swelling. 185/83, 98.3temp, CBG 211.the patient is in NAD at present.

## 2020-01-18 NOTE — ED Notes (Signed)
Pt difficult stick, antibiotics delayed at this time.

## 2020-01-18 NOTE — Progress Notes (Signed)
Pharmacy Antibiotic Note  Alexander Alvarado is a 79 y.o. male admitted on 01/18/2020. Concern for diabetic foot infection. Pharmacy has been consulted for vancomycin dosing.  Plan: Vanc 1 g IV x 1 given. Will follow with vanc 1750 mg IV q24h to start approximately 6 hours after 1 g dose.  Height: 5\' 10"  (177.8 cm) Weight: 150 lb (68 kg) IBW/kg (Calculated) : 73  Temp (24hrs), Avg:98.3 F (36.8 C), Min:98.3 F (36.8 C), Max:98.3 F (36.8 C)  Recent Labs  Lab 01/18/20 1246  WBC 14.1*  CREATININE 0.61  LATICACIDVEN 1.9    Estimated Creatinine Clearance: 75.6 mL/min (by C-G formula based on SCr of 0.61 mg/dL).    No Known Allergies  Antimicrobials this admission: Ceftriaxone 3/29 >> Vancomycin 3/29 >>  Dose adjustments this admission: NA  Microbiology results: 3/29 Wound Cx: pending  Thank you for allowing pharmacy to be a part of this patient's care.  Tawnya Crook, PharmD 01/18/2020 7:30 PM

## 2020-01-19 ENCOUNTER — Inpatient Hospital Stay: Payer: Medicare HMO

## 2020-01-19 ENCOUNTER — Inpatient Hospital Stay (HOSPITAL_COMMUNITY)
Admit: 2020-01-19 | Discharge: 2020-01-19 | Disposition: A | Discharge status: Home / Self Care | Payer: Medicare HMO | Attending: Internal Medicine | Admitting: Internal Medicine

## 2020-01-19 DIAGNOSIS — E440 Moderate protein-calorie malnutrition: Secondary | ICD-10-CM | POA: Insufficient documentation

## 2020-01-19 DIAGNOSIS — I6389 Other cerebral infarction: Secondary | ICD-10-CM

## 2020-01-19 DIAGNOSIS — M869 Osteomyelitis, unspecified: Secondary | ICD-10-CM

## 2020-01-19 DIAGNOSIS — I739 Peripheral vascular disease, unspecified: Secondary | ICD-10-CM

## 2020-01-19 DIAGNOSIS — R531 Weakness: Secondary | ICD-10-CM

## 2020-01-19 LAB — BASIC METABOLIC PANEL
Anion gap: 10 (ref 5–15)
BUN: 12 mg/dL (ref 8–23)
CO2: 27 mmol/L (ref 22–32)
Calcium: 8.9 mg/dL (ref 8.9–10.3)
Chloride: 101 mmol/L (ref 98–111)
Creatinine, Ser: 0.69 mg/dL (ref 0.61–1.24)
GFR calc Af Amer: >60 mL/min (ref >60)
GFR calc non Af Amer: >60 mL/min (ref >60)
Glucose, Bld: 90 mg/dL (ref 70–99)
Potassium: 3.9 mmol/L (ref 3.5–5.1)
Sodium: 138 mmol/L (ref 135–145)

## 2020-01-19 LAB — CBC
HCT: 49.6 % (ref 39.0–52.0)
Hemoglobin: 16.4 g/dL (ref 13.0–17.0)
MCH: 30.9 pg (ref 26.0–34.0)
MCHC: 33.1 g/dL (ref 30.0–36.0)
MCV: 93.6 fL (ref 80.0–100.0)
Platelets: 253 10*3/uL (ref 150–400)
RBC: 5.3 MIL/uL (ref 4.22–5.81)
RDW: 13.6 % (ref 11.5–15.5)
WBC: 11.1 10*3/uL — ABNORMAL HIGH (ref 4.0–10.5)
nRBC: 0 % (ref 0.0–0.2)

## 2020-01-19 LAB — URINALYSIS, COMPLETE (UACMP) WITH MICROSCOPIC
Bacteria, UA: NONE SEEN
Bilirubin Urine: NEGATIVE
Glucose, UA: NEGATIVE mg/dL
Hgb urine dipstick: NEGATIVE
Ketones, ur: NEGATIVE mg/dL
Nitrite: NEGATIVE
Protein, ur: NEGATIVE mg/dL
Specific Gravity, Urine: 1.01 (ref 1.005–1.030)
Squamous Epithelial / HPF: NONE SEEN (ref 0–5)
pH: 7 (ref 5.0–8.0)

## 2020-01-19 LAB — LIPID PANEL
Cholesterol: 105 mg/dL (ref 0–200)
HDL: 28 mg/dL — ABNORMAL LOW (ref >40)
LDL Cholesterol: 42 mg/dL (ref 0–99)
Total CHOL/HDL Ratio: 3.8 RATIO
Triglycerides: 175 mg/dL — ABNORMAL HIGH (ref <150)
VLDL: 35 mg/dL (ref 0–40)

## 2020-01-19 LAB — ECHOCARDIOGRAM COMPLETE
Height: 70 in
Weight: 2400 oz

## 2020-01-19 LAB — HEMOGLOBIN A1C
Hgb A1c MFr Bld: 5.8 % — ABNORMAL HIGH (ref 4.8–5.6)
Mean Plasma Glucose: 119.76 mg/dL

## 2020-01-19 LAB — SARS CORONAVIRUS 2 (TAT 6-24 HRS): SARS Coronavirus 2: NEGATIVE

## 2020-01-19 MED ORDER — OCUVITE-LUTEIN PO CAPS
1.0000 | ORAL_CAPSULE | Freq: Every day | ORAL | Status: DC
  Administered 2020-01-20 – 2020-02-01 (×11): 1 via ORAL
  Filled 2020-01-19 (×14): qty 1

## 2020-01-19 MED ORDER — MELATONIN 5 MG PO TABS
5.0000 mg | ORAL_TABLET | Freq: Every day | ORAL | Status: DC
  Administered 2020-01-19 – 2020-01-31 (×13): 5 mg via ORAL
  Filled 2020-01-19 (×13): qty 1

## 2020-01-19 MED ORDER — VITAMIN B-12 1000 MCG PO TABS
1000.0000 ug | ORAL_TABLET | Freq: Every day | ORAL | Status: DC
  Administered 2020-01-20 – 2020-02-01 (×11): 1000 ug via ORAL
  Filled 2020-01-19 (×11): qty 1

## 2020-01-19 MED ORDER — ENSURE ENLIVE PO LIQD
237.0000 mL | Freq: Three times a day (TID) | ORAL | Status: DC
  Administered 2020-01-20 – 2020-02-01 (×30): 237 mL via ORAL

## 2020-01-19 MED ORDER — FOLIC ACID 1 MG PO TABS
1.0000 mg | ORAL_TABLET | Freq: Every day | ORAL | Status: DC
  Administered 2020-01-20 – 2020-02-01 (×11): 1 mg via ORAL
  Filled 2020-01-19 (×11): qty 1

## 2020-01-19 NOTE — Consult Note (Addendum)
WOC consult requested for left 3rd toe and left heel wounds.  X-ray indicates possible osteomyelitis.  This complex medical condition is beyond the scope of practice for [facility] nurses.  Progress notes indicate a consult from podiatry is pending. Please refer to their team for further questions regarding plan of care.   Please re-consult if further assistance is needed.  Thank-you,  Julien Girt MSN, [place], [place], [place], [place]

## 2020-01-19 NOTE — Progress Notes (Signed)
OT Cancellation Note  Patient Details Name: Alexander Alvarado MRN: [id number] DOB: [birth year]   Cancelled Treatment:    Reason Eval/Treat Not Completed: Medical issues which prohibited therapy;Other (comment). Pt with multiple upcoming procedures scheduled including possible revascularization of the LLE and toe amputation.  Per MD request will complete OT orders at this time but will reassess pt upon completion of pending surgical procedures upon receipt of new OT orders.  Shara Blazing, M.S., OTR/L Ascom: [phone] 01/19/20, 3:32 PM

## 2020-01-19 NOTE — Progress Notes (Signed)
PROGRESS NOTE    Alexander Alvarado  [id number] DOB: August 01, 1941 DOA: 01/18/2020 PCP: Glean Hess, MD   Brief Narrative:  Alexander Alvarado is a 79 y.o. male with medical history significant of hypertension, peripheral vascular disease s/p amputation of second toe and CVA  Brought to ED with concern of left-sided weakness which started this morning.  Per patient he has some residual weakness on left side but when he got up this morning he was unable to move his left side.  He normally walks with the help of walker since his prior stroke, he could not grasp a cup or walk .  His symptoms started gradually improving as the day passes.  He denies any facial droop, denies any change in his vision.  He has left-sided drooping eyelid for a long time.  He was also complaining of worsening pain in his left foot.  He has chronic wounds in his left third toe and heel.  Apparently seen by in wound care nurse at home and getting treatment with no relief.  He denies using any antibiotics or seen a physician for that.  He has his second toe amputated due to osteo in the past.  He denies any fever or chills.  No upper respiratory symptoms.  He lives alone and denies any sick contacts.  He was also complaining of some increased urinary urgency and frequency.  Denies any dysuria, hematuria or lower abdominal pain.  Subjective: Patient was complaining of left foot pain.  He was unable to sleep last night.  No new focal deficit.  Strength seems at baseline now.  Assessment & Plan:   Active Problems:   PAD (peripheral artery disease) (HCC)   Osteomyelitis of left foot (HCC)  Left sided weakness.  Patient with history of prior stroke.  Initial CT head without any acute findings.  No New focal deficit.  Appears to be at baseline now. Complete stroke work-up with -MRI of brain -MRA of head and neck. -Lipid profile -A1c-5.8 -PT/OT evaluation. -Frequent neuro checks.  Left foot wound with osteomyelitis.   Patient has chronic wounds with radiologic evidence of osteomyelitis.  According to the advice of Dr. Luana Shu we will get wound cultures and then start him on antibiotics. -Get wound culture- -Start him on vancomycin, ceftriaxone and p.o. metronidazole per lower extremity wound protocol for moderate wound after cultures were sent. -Podiatrist Dr. Luana Shu was consulted -they consulted vascular surgery and patient will go left lower extremity angiogram with possible intervention with them on Friday followed by amputation. -Continue broad-spectrum antibiotics for now.  Hypertension.  Blood pressure mildly elevated.  On multiple antihypertensives at home.  We will restart home antihypertensives after MRI to rule out stroke.  Increased urinary urgency and frequency. -Check UA-Not being collected yet -Check A1c-5.8  Objective: Vitals:   01/18/20 2344 01/19/20 0524 01/19/20 0811 01/19/20 1237  BP: 133/64 135/66 135/71 122/77  Pulse: (!) 52 (!) 53 62 90  Resp: 17 17 17 17   Temp: 98.3 F (36.8 C) 98.1 F (36.7 C) 98.1 F (36.7 C) 98.4 F (36.9 C)  TempSrc: Oral Oral Oral Oral  SpO2: 99% 99% 97% 94%  Weight:      Height:        Intake/Output Summary (Last 24 hours) at 01/19/2020 1518 Last data filed at 01/19/2020 1257 Gross per 24 hour  Intake 3 ml  Output 751 ml  Net -748 ml   Filed Weights   01/18/20 1244  Weight: 68 kg  Examination:  General exam: Appears calm and comfortable  Respiratory system: Clear to auscultation. Respiratory effort normal. Cardiovascular system: S1 & S2 heard, RRR. No JVD, murmurs, rubs, gallops or clicks. Gastrointestinal system: Soft, nontender, nondistended, bowel sounds positive. Central nervous system: Alert and oriented. No focal neurological deficits.Symmetric 5 x 5 power. Extremities: No edema, no cyanosis, pulses intact and symmetrical. Skin: No rashes, lesions or ulcers Psychiatry: Judgement and insight appear normal. Mood & affect  appropriate.    DVT prophylaxis: Lovenox Code Status: DNR  family Communication: Pt. was updated. Disposition Plan: Pending improvement and surgical intervention.  Most likely will need SNF placement.  Consultants:   Podiatery  Vascular surgery  Procedures:  Antimicrobials: Ceftriaxone Flagyl Vancomycin  Data Reviewed: I have personally reviewed following labs and imaging studies  CBC: Recent Labs  Lab 01/18/20 1246 01/19/20 0513  WBC 14.1* 11.1*  NEUTROABS 10.7*  --   HGB 18.2* 16.4  HCT 54.7* 49.6  MCV 92.4 93.6  PLT 279 [id number]   Basic Metabolic Panel: Recent Labs  Lab 01/18/20 1246 01/19/20 0513  NA 136 138  K 3.5 3.9  CL 98 101  CO2 27 27  GLUCOSE 174* 90  BUN 11 12  CREATININE 0.61 0.69  CALCIUM 9.2 8.9   GFR: Estimated Creatinine Clearance: 75.6 mL/min (by C-G formula based on SCr of 0.69 mg/dL). Liver Function Tests: Recent Labs  Lab 01/18/20 1246  AST 16  ALT 14  ALKPHOS 133*  BILITOT 0.7  PROT 7.7  ALBUMIN 4.0   No results for input(s): LIPASE, AMYLASE in the last 168 hours. No results for input(s): AMMONIA in the last 168 hours. Coagulation Profile: No results for input(s): INR, PROTIME in the last 168 hours. Cardiac Enzymes: No results for input(s): CKTOTAL, CKMB, CKMBINDEX, TROPONINI in the last 168 hours. BNP (last 3 results) No results for input(s): PROBNP in the last 8760 hours. HbA1C: Recent Labs    01/19/20 0513  HGBA1C 5.8*   CBG: No results for input(s): GLUCAP in the last 168 hours. Lipid Profile: Recent Labs    01/19/20 0513  CHOL 105  HDL 28*  LDLCALC 42  TRIG 175*  CHOLHDL 3.8   Thyroid Function Tests: No results for input(s): TSH, T4TOTAL, FREET4, T3FREE, THYROIDAB in the last 72 hours. Anemia Panel: No results for input(s): VITAMINB12, FOLATE, FERRITIN, TIBC, IRON, RETICCTPCT in the last 72 hours. Sepsis Labs: Recent Labs  Lab 01/18/20 1246  LATICACIDVEN 1.9    Recent Results (from the past 240  hour(s))  SARS CORONAVIRUS 2 (TAT 6-24 HRS) Nasopharyngeal Nasopharyngeal Swab     Status: None   Collection Time: 01/18/20  4:26 PM   Specimen: Nasopharyngeal Swab  Result Value Ref Range Status   SARS Coronavirus 2 NEGATIVE NEGATIVE Final    Comment: (NOTE) SARS-CoV-2 target nucleic acids are NOT DETECTED. The SARS-CoV-2 RNA is generally detectable in upper and lower respiratory specimens during the acute phase of infection. Negative results do not preclude SARS-CoV-2 infection, do not rule out co-infections with other pathogens, and should not be used as the sole basis for treatment or other patient management decisions. Negative results must be combined with clinical observations, patient history, and epidemiological information. The expected result is Negative. Fact Sheet for Patients: SugarRoll.be Fact Sheet for Healthcare Providers: https://www.woods-mathews.com/ This test is not yet approved or cleared by the [place] FDA and  has been authorized for detection and/or diagnosis of SARS-CoV-2 by FDA under an Emergency Use Authorization (EUA). This EUA will remain  in effect (meaning this test can be used) for the duration of the COVID-19 declaration under Section 56 4(b)(1) of the Act, 21 U.S.C. section 360bbb-3(b)(1), unless the authorization is terminated or revoked sooner. Performed at [facility], [place] [address], [place], [place] [address]   Wound or Superficial Culture     Status: None (Preliminary result)   Collection Time: 01/18/20  4:53 PM   Specimen: Wound  Result Value Ref Range Status   Specimen Description   Final    WOUND Performed at [facility], [address], [place], [place] [address]    Special Requests   Final    LEFT 3RD TOE Performed at [facility], [address] [place], [place] [address]    Gram Stain   Final    NO WBC SEEN MODERATE GRAM POSITIVE COCCI MODERATE GRAM  POSITIVE RODS RARE GRAM NEGATIVE RODS    Culture   Final    FEW STAPHYLOCOCCUS AUREUS CULTURE REINCUBATED FOR BETTER GROWTH Performed at [facility], [place] [address] [place], [place] [address]    Report Status PENDING  Incomplete  Wound or Superficial Culture     Status: None (Preliminary result)   Collection Time: 01/18/20  4:53 PM   Specimen: Wound  Result Value Ref Range Status   Specimen Description   Final    WOUND Performed at [facility], [address] [place], [place] [address]    Special Requests   Final    LEFT HEEL Performed at [facility], [place], [place] [address]    Gram Stain NO WBC SEEN RARE GRAM POSITIVE COCCI   Final   Culture   Final    FEW STAPHYLOCOCCUS AUREUS CULTURE REINCUBATED FOR BETTER GROWTH Performed at [facility], [place] [address] [place], [place] [address]    Report Status PENDING  Incomplete     Radiology Studies: CT Head Wo Contrast  Result Date: 01/18/2020 CLINICAL DATA:  Left-sided weakness EXAM: CT HEAD WITHOUT CONTRAST TECHNIQUE: Contiguous axial images were obtained from the base of the skull through the vertex without intravenous contrast. COMPARISON:  CT brain 09/10/2019 FINDINGS: Brain: No acute territorial infarction, hemorrhage or intracranial mass. Moderate atrophy. Chronic infarcts in the left cerebellum, right greater than left basal ganglia, and right white matter. Hypodensity in the white matter consistent with chronic small vessel ischemic change. Stable ventricular size with mild ex vacuo dilatation of right frontal horn. Vascular: No hyperdense vessels. Vertebral and carotid vascular calcification. Skull: Normal. Negative for fracture or focal lesion. Sinuses/Orbits: No acute finding. Other: None IMPRESSION: 1. No CT evidence for acute intracranial abnormality. 2. Atrophy and chronic small vessel ischemic changes of the white matter. Chronic multifocal lacunar infarcts.  Electronically Signed   By: Donavan Foil M.D.   On: 01/18/2020 17:02   US ARTERIAL ABI (SCREENING LOWER EXTREMITY)  Result Date: 01/19/2020 CLINICAL DATA:  79 year old male with peripheral arterial disease and bilateral surgical bypasses. EXAM: NONINVASIVE PHYSIOLOGIC VASCULAR STUDY OF BILATERAL LOWER EXTREMITIES TECHNIQUE: Evaluation of both lower extremities were performed at rest, including calculation of ankle-brachial indices with single level Doppler, pressure and pulse volume recording. COMPARISON:  None. FINDINGS: Right ABI:  0.61 Left ABI:  0.53 Right Lower Extremity: Abnormal biphasic arterial waveforms at the ankle. Left Lower Extremity: Abnormal monophasic arterial waveforms at the ankle. 0.5-0.79 Moderate PAD IMPRESSION: Abnormal bilateral resting ankle-brachial indices consistent with at least moderate underlying peripheral arterial disease. The abnormalities are slightly  worse on the left than the right. Signed, Criselda Peaches, MD, [facility] Electronically Signed   By: Jacqulynn Cadet M.D.   On: 01/19/2020 14:10   DG Foot Complete Left  Result Date: 01/18/2020 CLINICAL DATA:  Dorsal left third toe wound. Neuropathy EXAM: LEFT FOOT - COMPLETE 3+ VIEW COMPARISON:  None. FINDINGS: Prior second digit amputation at the proximal phalanx. Cortical irregularity involving the dorsal cortex of the third digit proximal phalanx distally. There is a hammertoe deformity involving the third digit. No fracture is seen. No dislocation. Soft tissue swelling without evidence of soft tissue gas. IMPRESSION: Cortical irregularity involving the dorsal cortex of the third digit proximal phalanx raising the suspicion for osteomyelitis in the setting of dorsal skin wound. If further imaging is clinically warranted, an MRI could be performed. Electronically Signed   By: Davina Poke D.O.   On: 01/18/2020 14:00   ECHOCARDIOGRAM  COMPLETE  Result Date: 01/19/2020    ECHOCARDIOGRAM REPORT   Patient Name:   OVA VOTH Date of Exam: 01/19/2020 Medical Rec #:  [id number]       Height:       70.0 in Accession #:    [id number]      Weight:       150.0 lb Date of Birth:  1941/01/18      BSA:          1.847 m Patient Age:    79 years        BP:           135/66 mmHg Patient Gender: M               HR:           72 bpm. Exam Location:  [facility] Procedure: 2D Echo, Color Doppler and Cardiac Doppler Indications:     I163.9 Stroke  History:         Patient has no prior history of Echocardiogram examinations.                  PVD, Signs/Symptoms:Left sided weakness; Risk                  Factors:Hypertension and HCL.  Sonographer:     Charmayne Sheer RDCS (AE) Referring Phys:  [id number] [facility] Jesten Cappuccio Diagnosing Phys: Harrell Gave End MD  Sonographer Comments: Suboptimal parasternal window and suboptimal subcostal window. IMPRESSIONS  1. Left ventricular ejection fraction, by estimation, is 45%. The left ventricle has mild to moderately decreased function. The left ventricle demonstrates global hypokinesis. Left ventricular diastolic parameters are consistent with Grade I diastolic dysfunction (impaired relaxation).  2. Right ventricular systolic function is normal. The right ventricular size is normal. Tricuspid regurgitation signal is inadequate for assessing PA pressure.  3. The mitral valve is degenerative. No evidence of mitral valve regurgitation. No evidence of mitral stenosis.  4. The aortic valve was not well visualized. Aortic valve regurgitation is not visualized. Mild aortic valve sclerosis is present, with no evidence of aortic valve stenosis. FINDINGS  Left Ventricle: Left ventricular ejection fraction, by estimation, is 45%. The left ventricle has mild to moderately decreased function. The left ventricle demonstrates global hypokinesis. The left ventricular internal cavity size was normal in size. There is no left ventricular hypertrophy. Abnormal  (paradoxical) septal motion, consistent with left bundle branch block. Left ventricular diastolic parameters are consistent with Grade I diastolic dysfunction (impaired relaxation). Right Ventricle: The right ventricular size is normal. Right vetricular  wall thickness was not assessed. Right ventricular systolic function is normal. Tricuspid regurgitation signal is inadequate for assessing PA pressure. Left Atrium: Left atrial size was normal in size. Right Atrium: Right atrial size was normal in size. Pericardium: The pericardium was not well visualized. Mitral Valve: The mitral valve is degenerative in appearance. There is mild thickening of the mitral valve leaflet(s). Moderate mitral annular calcification. No evidence of mitral valve regurgitation. No evidence of mitral valve stenosis. MV peak gradient, 4.2 mmHg. The mean mitral valve gradient is 2.0 mmHg. Tricuspid Valve: The tricuspid valve is not well visualized. Tricuspid valve regurgitation is not demonstrated. Aortic Valve: The aortic valve was not well visualized. Aortic valve regurgitation is not visualized. Mild aortic valve sclerosis is present, with no evidence of aortic valve stenosis. Mild to moderate aortic valve annular calcification. Aortic valve mean gradient measures 3.0 mmHg. Aortic valve peak gradient measures 6.1 mmHg. Aortic valve area, by VTI measures 1.56 cm. Pulmonic Valve: The pulmonic valve was not well visualized. Pulmonic valve regurgitation is not visualized. No evidence of pulmonic stenosis. Aorta: The aortic root is normal in size and structure. Pulmonary Artery: The pulmonary artery is of normal size. Venous: The inferior vena cava was not well visualized. IAS/Shunts: The interatrial septum was not well visualized.  LEFT VENTRICLE PLAX 2D LVIDd:         4.71 cm      Diastology LVIDs:         3.65 cm      LV e' lateral:   5.44 cm/s LV PW:         0.94 cm      LV E/e' lateral: 11.0 LV IVS:        0.81 cm      LV e' medial:    5.00  cm/s LVOT diam:     2.10 cm      LV E/e' medial:  11.9 LV SV:         32 LV SV Index:   17 LVOT Area:     3.46 cm  LV Volumes (MOD) LV vol d, MOD A4C: 107.0 ml LV vol s, MOD A4C: 49.2 ml LV SV MOD A4C:     107.0 ml LEFT ATRIUM           Index       RIGHT ATRIUM           Index LA diam:      3.90 cm 2.11 cm/m  RA Area:     14.14 cm 7.66 cm/m LA Vol (A4C): 33.2 ml 17.97 ml/m  AORTIC VALVE                   PULMONIC VALVE AV Area (Vmax):    1.54 cm    PV Vmax:       0.88 m/s AV Area (Vmean):   1.60 cm    PV Vmean:      60.300 cm/s AV Area (VTI):     1.56 cm    PV VTI:        0.154 m AV Vmax:           123.00 cm/s PV Peak grad:  3.1 mmHg AV Vmean:          81.200 cm/s PV Mean grad:  2.0 mmHg AV VTI:            0.203 m AV Peak Grad:      6.1 mmHg AV Mean Grad:  3.0 mmHg LVOT Vmax:         54.70 cm/s LVOT Vmean:        37.400 cm/s LVOT VTI:          0.092 m LVOT/AV VTI ratio: 0.45  AORTA Ao Root diam: 3.20 cm MITRAL VALVE MV Area (PHT): 2.91 cm    SHUNTS MV Peak grad:  4.2 mmHg    Systemic VTI:  0.09 m MV Mean grad:  2.0 mmHg    Systemic Diam: 2.10 cm MV Vmax:       1.03 m/s MV Vmean:      60.0 cm/s MV Decel Time: 261 msec MV E velocity: 59.70 cm/s MV A velocity: 78.10 cm/s MV E/A ratio:  0.76 Harrell Gave End MD Electronically signed by Nelva Bush MD Signature Date/Time: 01/19/2020/10:29:16 AM    Final     Scheduled Meds: . amLODipine  10 mg Oral Daily  . aspirin EC  81 mg Oral Daily  . atorvastatin  20 mg Oral QHS  . enoxaparin (LOVENOX) injection  40 mg Subcutaneous Q24H  . [START ON [id number] folic acid  1 mg Oral Daily  . melatonin  5 mg Oral QHS  . metroNIDAZOLE  500 mg Oral Q8H  . pantoprazole  40 mg Oral Daily  . sodium chloride flush  3 mL Intravenous Q12H  . [START ON 01/20/2020] vitamin B-12  1,000 mcg Oral Daily   Continuous Infusions: . cefTRIAXone (ROCEPHIN)  IV Stopped (01/18/20 2128)  . vancomycin       LOS: 1 day   Time spent: 40 minutes.  Lorella Nimrod, MD [facility]  If 7PM-7AM, please contact night-coverage [email or web address]  01/19/2020, 3:18 PM   This record has been created using Systems analyst. Errors have been sought and corrected,but may not always be located. Such creation errors do not reflect on the standard of care.

## 2020-01-19 NOTE — Consult Note (Signed)
PODIATRY / FOOT AND ANKLE SURGERY CONSULTATION NOTE  Requesting Physician: Lorella Nimrod MD  Reason for consult: L foot wound  Chief Complaint: L 3rd toe and heel wound   HPI: Alexander Alvarado is a 79 y.o. male who presents with multiple medical issues.  Patient arrived via EMS due to left-sided weakness and concern for potential CVA.  Patient was also noted to have a ulceration to the dorsal aspect of the left third toe with bone exposed as well as posterior aspect left heel.  Patient is currently being worked up for CVA with medical treatment by medical team.  Patient states he has had the ulceration on his left toe and heel for a number of months now.  Patient normally sees Dr. Vickki Muff, his podiatrist, for wound care management.  Patient has failed to follow-up since his last appointment in February.  Patient is also had a history of revascularization performed by the vascular team at [facility].  Dr. Vickki Muff requested an appointment be made for the patient at his last visit in February but again the patient failed to follow-up.  Patient has previously had an amputation to a portion of his left second toe.  PMHx:  Past Medical History:  Diagnosis Date  . Depression   . GERD (gastroesophageal reflux disease)   . High cholesterol   . Hypertension   . PVD (peripheral vascular disease) ([place])   . Stroke [facility])    left side weakness    Surgical Hx:  Past Surgical History:  Procedure Laterality Date  . AMPUTATION Left 08/31/2015   Procedure: AMPUTATION DIGIT left 2nd toe ;  Surgeon: Samara Deist, DPM;  Location: [place];  Service: Podiatry;  Laterality: Left;  . AMPUTATION TOE Left 08/2015   second toe  . CHOLECYSTECTOMY    . HIP ARTHROPLASTY Right 09/11/2019   Procedure: ARTHROPLASTY BIPOLAR HIP (HEMIARTHROPLASTY);  Surgeon: Leim Fabry, MD;  Location: [facility] ORS;  Service: Orthopedics;  Laterality: Right;  . LUMBAR DISC SURGERY    . VASCULAR SURGERY Left  2011   x 5 to left leg    FHx:  Family History  Problem Relation Age of Onset  . CAD Mother     Social History:  reports that he has been smoking cigarettes. He has been smoking about 0.50 packs per day. He has never used smokeless tobacco. He reports that he does not drink alcohol or use drugs.  Allergies: No Known Allergies  Review of Systems: General ROS: negative Respiratory ROS: no cough, shortness of breath, or wheezing Cardiovascular ROS: no chest pain or dyspnea on exertion Gastrointestinal ROS: no abdominal pain, change in bowel habits, or black or bloody stools Musculoskeletal ROS: positive for - muscular weakness and pain in foot - left Neurological ROS: positive for - numbness/tingling and weakness Dermatological ROS: positive for dry skin, hair changes, nail changes and Left heel and third toe wound  Medications Prior to Admission  Medication Sig Dispense Refill  . acetaminophen (TYLENOL) 325 MG tablet Take 2 tablets (650 mg total) by mouth every 6 (six) hours as needed for mild pain. 100 tablet 2  . amLODipine (NORVASC) 10 MG tablet Take 1 tablet (10 mg total) by mouth daily. 90 tablet 1  . aspirin 81 MG tablet Take 81 mg by mouth daily.     Marland Kitchen atorvastatin (LIPITOR) 20 MG tablet TAKE 1 TABLET BY MOUTH AT BEDTIME 90 tablet 1  . cyanocobalamin 1000 MCG tablet Take 1,000 mcg by mouth daily.     Marland Kitchen  folic acid (FOLVITE) 1 MG tablet Take 1 mg by mouth daily.     . hydrALAZINE (APRESOLINE) 25 MG tablet Take 1 tablet (25 mg total) by mouth 2 (two) times daily. 180 tablet 3  . metoprolol tartrate (LOPRESSOR) 25 MG tablet Take 1 tablet (25 mg total) by mouth 2 (two) times daily. 180 tablet 3  . omeprazole (PRILOSEC) 40 MG capsule Take 1 capsule (40 mg total) by mouth daily. 90 capsule 3  . quinapril (ACCUPRIL) 40 MG tablet Take 1 tablet (40 mg total) by mouth daily. 90 tablet 1  . oxyCODONE (OXY IR/ROXICODONE) 5 MG immediate release tablet Take 1 tablet (5 mg total) by mouth  every 3 (three) hours as needed for moderate pain (pain score 4-6). (Patient not taking: Reported on 01/18/2020) 30 tablet 0    Physical Exam: General: Alert and oriented.  No apparent distress.  Vascular: DP/PT pulses nonpalpable bilateral.  Capillary fill time does appear to be intact to all remaining digits of both feet.  Mild nonpitting edema to bilateral lower extremities with rubor with dependency of the left lower extremity that is reduced with elevation.  No hair growth present to bilateral lower extremities.  Skin appears to be warm to touch overall to bilateral lower extremities.  Neuro: Light touch sensation reduced to bilateral lower extremities left greater than right.  Derm: Ulceration present to the left PIPJ over the dorsal aspect of the toe with the PIPJ bone exposed as the head of the proximal phalanx appears to be exposed through the wound.  The bone appears to be hard and intact with no pain on palpation, dark necrotic changes seen over the wound consistent with dry gangrene, no drainage, mild odor, minimal periwound erythema, mild edema.  Wound measures approximately 1.5 cm x 1 cm x 0.4 cm to bone  Ulceration present to the posterior lateral aspect the left heel that appears to go to subcutaneous depth only, mild hyperkeratotic border around the periphery of the wound, mild serous drainage, no erythema, mild edema, minimal odor.  Wound measures approximately 2 cm x 1.5 x 0.2 cm.  Nails x9 appear to be thickened, elongated, dystrophic and brittle with subungual debris.  Notable dirt and grime to the plantar aspects of both feet, not hygienic.  MSK: Pain on palpation to the posterior aspect the left heel around the ulceration site.  Left second toe partial amputation.  Results for orders placed or performed during the hospital encounter of 01/18/20 (from the past 48 hour(s))  Lactic acid, plasma     Status: None   Collection Time: 01/18/20 12:46 PM  Result Value Ref Range    Lactic Acid, Venous 1.9 0.5 - 1.9 mmol/L    Comment: Performed at [facility], [address] [place], [place] [address]  Comprehensive metabolic panel     Status: Abnormal   Collection Time: 01/18/20 12:46 PM  Result Value Ref Range   Sodium 136 135 - 145 mmol/L   Potassium 3.5 3.5 - 5.1 mmol/L   Chloride 98 98 - 111 mmol/L   CO2 27 22 - 32 mmol/L   Glucose, Bld 174 (H) 70 - 99 mg/dL    Comment: Glucose reference range applies only to samples taken after fasting for at least 8 hours.   BUN 11 8 - 23 mg/dL   Creatinine, Ser 0.61 0.61 - 1.24 mg/dL   Calcium 9.2 8.9 - 10.3 mg/dL   Total Protein 7.7 6.5 - 8.1 g/dL   Albumin 4.0 3.5 -  5.0 g/dL   AST 16 15 - 41 U/L   ALT 14 0 - 44 U/L   Alkaline Phosphatase 133 (H) 38 - 126 U/L   Total Bilirubin 0.7 0.3 - 1.2 mg/dL   GFR calc non Af Amer >60 >60 mL/min   GFR calc Af Amer >60 >60 mL/min   Anion gap 11 5 - 15    Comment: Performed at [facility], [address] [place], [place] [address]  CBC with Differential     Status: Abnormal   Collection Time: 01/18/20 12:46 PM  Result Value Ref Range   WBC 14.1 (H) 4.0 - 10.5 K/uL   RBC 5.92 (H) 4.22 - 5.81 MIL/uL   Hemoglobin 18.2 (H) 13.0 - 17.0 g/dL   HCT 54.7 (H) 39.0 - 52.0 %   MCV 92.4 80.0 - 100.0 fL   MCH 30.7 26.0 - 34.0 pg   MCHC 33.3 30.0 - 36.0 g/dL   RDW 13.6 11.5 - 15.5 %   Platelets 279 150 - 400 K/uL   nRBC 0.0 0.0 - 0.2 %   Neutrophils Relative % 76 %   Neutro Abs 10.7 (H) 1.7 - 7.7 K/uL   Lymphocytes Relative 17 %   Lymphs Abs 2.4 0.7 - 4.0 K/uL   Monocytes Relative 7 %   Monocytes Absolute 0.9 0.1 - 1.0 K/uL   Eosinophils Relative 0 %   Eosinophils Absolute 0.0 0.0 - 0.5 K/uL   Basophils Relative 0 %   Basophils Absolute 0.1 0.0 - 0.1 K/uL   Immature Granulocytes 0 %   Abs Immature Granulocytes 0.05 0.00 - 0.07 K/uL    Comment: Performed at [facility], [address] [place], [place] [address]  SARS CORONAVIRUS 2 (TAT 6-24 HRS)  Nasopharyngeal Nasopharyngeal Swab     Status: None   Collection Time: 01/18/20  4:26 PM   Specimen: Nasopharyngeal Swab  Result Value Ref Range   SARS Coronavirus 2 NEGATIVE NEGATIVE    Comment: (NOTE) SARS-CoV-2 target nucleic acids are NOT DETECTED. The SARS-CoV-2 RNA is generally detectable in upper and lower respiratory specimens during the acute phase of infection. Negative results do not preclude SARS-CoV-2 infection, do not rule out co-infections with other pathogens, and should not be used as the sole basis for treatment or other patient management decisions. Negative results must be combined with clinical observations, patient history, and epidemiological information. The expected result is Negative. Fact Sheet for Patients: SugarRoll.be Fact Sheet for Healthcare Providers: https://www.woods-mathews.com/ This test is not yet approved or cleared by the [place] FDA and  has been authorized for detection and/or diagnosis of SARS-CoV-2 by FDA under an Emergency Use Authorization (EUA). This EUA will remain  in effect (meaning this test can be used) for the duration of the COVID-19 declaration under Section 56 4(b)(1) of the Act, 21 U.S.C. section 360bbb-3(b)(1), unless the authorization is terminated or revoked sooner. Performed at [facility], [place] [address] [place], [place] [address]   Wound or Superficial Culture     Status: None (Preliminary result)   Collection Time: 01/18/20  4:53 PM   Specimen: Wound  Result Value Ref Range   Specimen Description      WOUND Performed at [facility], [address], [place], [place] [address]    Special Requests      LEFT 3RD TOE Performed at [facility], [address] [place], [place] [address]    Gram Stain      NO  WBC SEEN MODERATE GRAM POSITIVE COCCI MODERATE GRAM POSITIVE RODS RARE GRAM NEGATIVE RODS    Culture      FEW STAPHYLOCOCCUS  AUREUS CULTURE REINCUBATED FOR BETTER GROWTH Performed at [facility], [place] [address], [place], [place] [address]    Report Status PENDING   Wound or Superficial Culture     Status: None (Preliminary result)   Collection Time: 01/18/20  4:53 PM   Specimen: Wound  Result Value Ref Range   Specimen Description      WOUND Performed at [facility], [address] [place], [place] [address]    Special Requests      LEFT HEEL Performed at [facility], [place], [place] [address]    Gram Stain NO WBC SEEN RARE GRAM POSITIVE COCCI     Culture      FEW STAPHYLOCOCCUS AUREUS CULTURE REINCUBATED FOR BETTER GROWTH Performed at [facility], [place] [address] [place], [place] [address]    Report Status PENDING   Sedimentation rate     Status: None   Collection Time: 01/18/20  6:57 PM  Result Value Ref Range   Sed Rate 7 0 - 20 mm/hr    Comment: Performed at [facility], [address] [place], [place] [address]  C-reactive protein     Status: Abnormal   Collection Time: 01/18/20  6:57 PM  Result Value Ref Range   CRP 3.3 (H) <1.0 mg/dL    Comment: Performed at [facility], [place] [address], [place], [place] [address]  Prealbumin     Status: Abnormal   Collection Time: 01/18/20  6:57 PM  Result Value Ref Range   Prealbumin 12.6 (L) 18 - 38 mg/dL    Comment: Performed at [facility] [address], [place], [place] [address]  Lipid panel     Status: Abnormal   Collection Time: 01/19/20  5:13 AM  Result Value Ref Range   Cholesterol 105 0 - 200 mg/dL   Triglycerides 175 (H) <150 mg/dL   HDL 28 (L) >40 mg/dL   Total CHOL/HDL Ratio 3.8 RATIO   VLDL 35 0 - 40 mg/dL   LDL Cholesterol 42 0 - 99 mg/dL    Comment:        Total Cholesterol/HDL:CHD Risk Coronary Heart Disease Risk Table                     Men   Women  1/2 Average Risk   3.4   3.3  Average Risk       5.0   4.4  2 X Average Risk   9.6   7.1  3 X  Average Risk  23.4   11.0        Use the calculated Patient Ratio above and the CHD Risk Table to determine the patient's CHD Risk.        ATP III CLASSIFICATION (LDL):  <100     mg/dL   Optimal  100-129  mg/dL   Near or Above                    Optimal  130-159  mg/dL   Borderline  160-189  mg/dL   High  >190     mg/dL   Very High Performed at [address] [place],  [address]   Hemoglobin A1c     Status: Abnormal   Collection Time: 01/19/20  5:13 AM  Result Value Ref Range   Hgb A1c MFr Bld 5.8 (H) 4.8 - 5.6 %    Comment: (NOTE) Pre diabetes:          5.7%-6.4% Diabetes:              >6.4% Glycemic control for   <7.0% adults with diabetes    Mean Plasma Glucose 119.76 mg/dL    Comment: Performed at [facility] [address], [place], [place] [id number]  Basic metabolic panel     Status: None   Collection Time: 01/19/20  5:13 AM  Result Value Ref Range   Sodium 138 135 - 145 mmol/L   Potassium 3.9 3.5 - 5.1 mmol/L   Chloride 101 98 - 111 mmol/L   CO2 27 22 - 32 mmol/L   Glucose, Bld 90 70 - 99 mg/dL    Comment: Glucose reference range applies only to samples taken after fasting for at least 8 hours.   BUN 12 8 - 23 mg/dL   Creatinine, Ser 0.69 0.61 - 1.24 mg/dL   Calcium 8.9 8.9 - 10.3 mg/dL   GFR calc non Af Amer >60 >60 mL/min   GFR calc Af Amer >60 >60 mL/min   Anion gap 10 5 - 15    Comment: Performed at [facility], [address] [place], [place] [address]  CBC     Status: Abnormal   Collection Time: 01/19/20  5:13 AM  Result Value Ref Range   WBC 11.1 (H) 4.0 - 10.5 K/uL   RBC 5.30 4.22 - 5.81 MIL/uL   Hemoglobin 16.4 13.0 - 17.0 g/dL   HCT 49.6 39.0 - 52.0 %   MCV 93.6 80.0 - 100.0 fL   MCH 30.9 26.0 - 34.0 pg   MCHC 33.1 30.0 - 36.0 g/dL   RDW 13.6 11.5 - 15.5 %   Platelets 253 150 - 400 K/uL   nRBC 0.0 0.0 - 0.2 %    Comment: Performed at [facility], [place], [place]  [address]   CT Head Wo Contrast  Result Date: 01/18/2020 CLINICAL DATA:  Left-sided weakness EXAM: CT HEAD WITHOUT CONTRAST TECHNIQUE: Contiguous axial images were obtained from the base of the skull through the vertex without intravenous contrast. COMPARISON:  CT brain 09/10/2019 FINDINGS: Brain: No acute territorial infarction, hemorrhage or intracranial mass. Moderate atrophy. Chronic infarcts in the left cerebellum, right greater than left basal ganglia, and right white matter. Hypodensity in the white matter consistent with chronic small vessel ischemic change. Stable ventricular size with mild ex vacuo dilatation of right frontal horn. Vascular: No hyperdense vessels. Vertebral and carotid vascular calcification. Skull: Normal. Negative for fracture or focal lesion. Sinuses/Orbits: No acute finding. Other: None IMPRESSION: 1. No CT evidence for acute intracranial abnormality. 2. Atrophy and chronic small vessel ischemic changes of the white matter. Chronic multifocal lacunar infarcts. Electronically Signed   By: Donavan Foil M.D.   On: 01/18/2020 17:02   DG Foot Complete Left  Result Date: 01/18/2020 CLINICAL DATA:  Dorsal left third toe wound. Neuropathy EXAM: LEFT FOOT - COMPLETE 3+ VIEW COMPARISON:  None. FINDINGS: Prior second digit amputation at the proximal phalanx. Cortical irregularity involving the dorsal cortex of the third digit proximal phalanx distally. There is a hammertoe deformity involving the third digit. No fracture is seen. No dislocation. Soft tissue swelling without evidence of soft tissue gas. IMPRESSION: Cortical irregularity involving the dorsal cortex of the third digit proximal phalanx  raising the suspicion for osteomyelitis in the setting of dorsal skin wound. If further imaging is clinically warranted, an MRI could be performed. Electronically Signed   By: Davina Poke D.O.   On: 01/18/2020 14:00   ECHOCARDIOGRAM COMPLETE  Result Date: 01/19/2020    ECHOCARDIOGRAM  REPORT   Patient Name:   Alexander Alvarado Date of Exam: 01/19/2020 Medical Rec #:  [id number]       Height:       70.0 in Accession #:    [id number]      Weight:       150.0 lb Date of Birth:  05/30/1941      BSA:          1.847 m Patient Age:    60 years        BP:           135/66 mmHg Patient Gender: M               HR:           72 bpm. Exam Location:  [facility] Procedure: 2D Echo, Color Doppler and Cardiac Doppler Indications:     I163.9 Stroke  History:         Patient has no prior history of Echocardiogram examinations.                  PVD, Signs/Symptoms:Left sided weakness; Risk                  Factors:Hypertension and HCL.  Sonographer:     Charmayne Sheer RDCS (AE) Referring Phys:  [id number] [facility] AMIN Diagnosing Phys: Harrell Gave End MD  Sonographer Comments: Suboptimal parasternal window and suboptimal subcostal window. IMPRESSIONS  1. Left ventricular ejection fraction, by estimation, is 45%. The left ventricle has mild to moderately decreased function. The left ventricle demonstrates global hypokinesis. Left ventricular diastolic parameters are consistent with Grade I diastolic dysfunction (impaired relaxation).  2. Right ventricular systolic function is normal. The right ventricular size is normal. Tricuspid regurgitation signal is inadequate for assessing PA pressure.  3. The mitral valve is degenerative. No evidence of mitral valve regurgitation. No evidence of mitral stenosis.  4. The aortic valve was not well visualized. Aortic valve regurgitation is not visualized. Mild aortic valve sclerosis is present, with no evidence of aortic valve stenosis. FINDINGS  Left Ventricle: Left ventricular ejection fraction, by estimation, is 45%. The left ventricle has mild to moderately decreased function. The left ventricle demonstrates global hypokinesis. The left ventricular internal cavity size was normal in size. There is no left ventricular hypertrophy. Abnormal (paradoxical) septal motion, consistent with left  bundle branch block. Left ventricular diastolic parameters are consistent with Grade I diastolic dysfunction (impaired relaxation). Right Ventricle: The right ventricular size is normal. Right vetricular wall thickness was not assessed. Right ventricular systolic function is normal. Tricuspid regurgitation signal is inadequate for assessing PA pressure. Left Atrium: Left atrial size was normal in size. Right Atrium: Right atrial size was normal in size. Pericardium: The pericardium was not well visualized. Mitral Valve: The mitral valve is degenerative in appearance. There is mild thickening of the mitral valve leaflet(s). Moderate mitral annular calcification. No evidence of mitral valve regurgitation. No evidence of mitral valve stenosis. MV peak gradient, 4.2 mmHg. The mean mitral valve gradient is 2.0 mmHg. Tricuspid Valve: The tricuspid valve is not well visualized. Tricuspid valve regurgitation is not demonstrated. Aortic Valve: The aortic valve was not well visualized. Aortic valve regurgitation is not visualized.  Mild aortic valve sclerosis is present, with no evidence of aortic valve stenosis. Mild to moderate aortic valve annular calcification. Aortic valve mean gradient measures 3.0 mmHg. Aortic valve peak gradient measures 6.1 mmHg. Aortic valve area, by VTI measures 1.56 cm. Pulmonic Valve: The pulmonic valve was not well visualized. Pulmonic valve regurgitation is not visualized. No evidence of pulmonic stenosis. Aorta: The aortic root is normal in size and structure. Pulmonary Artery: The pulmonary artery is of normal size. Venous: The inferior vena cava was not well visualized. IAS/Shunts: The interatrial septum was not well visualized.  LEFT VENTRICLE PLAX 2D LVIDd:         4.71 cm      Diastology LVIDs:         3.65 cm      LV e' lateral:   5.44 cm/s LV PW:         0.94 cm      LV E/e' lateral: 11.0 LV IVS:        0.81 cm      LV e' medial:    5.00 cm/s LVOT diam:     2.10 cm      LV E/e' medial:   11.9 LV SV:         32 LV SV Index:   17 LVOT Area:     3.46 cm  LV Volumes (MOD) LV vol d, MOD A4C: 107.0 ml LV vol s, MOD A4C: 49.2 ml LV SV MOD A4C:     107.0 ml LEFT ATRIUM           Index       RIGHT ATRIUM           Index LA diam:      3.90 cm 2.11 cm/m  RA Area:     14.14 cm 7.66 cm/m LA Vol (A4C): 33.2 ml 17.97 ml/m  AORTIC VALVE                   PULMONIC VALVE AV Area (Vmax):    1.54 cm    PV Vmax:       0.88 m/s AV Area (Vmean):   1.60 cm    PV Vmean:      60.300 cm/s AV Area (VTI):     1.56 cm    PV VTI:        0.154 m AV Vmax:           123.00 cm/s PV Peak grad:  3.1 mmHg AV Vmean:          81.200 cm/s PV Mean grad:  2.0 mmHg AV VTI:            0.203 m AV Peak Grad:      6.1 mmHg AV Mean Grad:      3.0 mmHg LVOT Vmax:         54.70 cm/s LVOT Vmean:        37.400 cm/s LVOT VTI:          0.092 m LVOT/AV VTI ratio: 0.45  AORTA Ao Root diam: 3.20 cm MITRAL VALVE MV Area (PHT): 2.91 cm    SHUNTS MV Peak grad:  4.2 mmHg    Systemic VTI:  0.09 m MV Mean grad:  2.0 mmHg    Systemic Diam: 2.10 cm MV Vmax:       1.03 m/s MV Vmean:      60.0 cm/s MV Decel Time: 261 msec MV E velocity: 59.70 cm/s MV A velocity: 78.10 cm/s  MV E/A ratio:  0.76 Harrell Gave End MD Electronically signed by Nelva Bush MD Signature Date/Time: 01/19/2020/10:29:16 AM    Final     Blood pressure 122/77, pulse 90, temperature 98.4 F (36.9 C), temperature source Oral, resp. rate 17, height 5\' 10"  (1.778 m), weight 68 kg, SpO2 94 %.  Assessment 1. Osteomyelitis left third toe with chronic ulceration over the PIPJ of the dorsal aspect, dry gangrene 2. Left posterior heel subcutaneous ulceration 3. PVD 4. Neuropathy  Plan -Patient seen and examined -X-ray reviewed and discussed with patient in detail of the left foot showing potential signs for osteomyelitis at the proximal phalanx at the PIPJ. -Clinically patient has bone exposed at the PIPJ level with no soft tissue coverage over this area indicating osteomyelitis  present.  Patient has minimal erythema around the wound and the redness is reduced with elevation of the left foot indicating vascular component of disease. -100% excisional subcutaneous wound debridement performed to the left posterior heel with 15 blade without incident.  The wound bed appeared to be healthy and granular overall with a bleeding base. -100% excisional subcutaneous, tenderness, bone debridement performed to the left third toe with a 15 blade without incident.  The wound bed still appears to be fibronecrotic overall but most of the necrotic material was resected, minimal bleeding occurred. -Dressed both wounds with Betadine soaked gauze, 4 x 4 gauze, Kerlix, tape.  Patient have dressings changed daily.  Orders placed. -Prevalon boots ordered for both feet when patient is resting in bed. -Appreciate vascular recommendations.  Plan for intervention on Friday. -Appreciate medicine recommendations for antibiotic therapy.  Culture results so far growing gram-positive cocci, gram-negative rods, gram-positive rods -staph aureus. -Discussed with patient that he will need to have a left third toe total amputation with potential metatarsal head resection if the infection appears to be going proximal to the proximal phalanx base.  Discussed all treatment options with the patient both conservative and surgical attempts at correction at this time patient is elected for amputation of the left third toe (partial third ray) and wound debridement to the left posterior heel. -Discussed with patient that procedure will need to take place after vascular intervention to improve blood flow status to left lower extremity to increase the odds of the amputation healing.  Patient understands. -We will plan to perform the amputation sometime later either this weekend or early next week after vascular intervention.  Caroline More, DPM 01/19/2020, 12:44 PM

## 2020-01-19 NOTE — Progress Notes (Signed)
PT Cancellation Note  Patient Details Name: Alexander Alvarado MRN: [id number] DOB: 11-Jul-1941   Cancelled Treatment:    Reason Eval/Treat Not Completed: Other (comment); Order received and chart reviewed. Pt noted to be pending specialty consult to vascular surgery to determine POC. Will hold PT evaluation at this time and initiate services as POC established and pt medically appropriate for therapy.    Heloise Beecham Gussie Murton PT, DPT 01/19/20, 9:30 AM

## 2020-01-19 NOTE — Progress Notes (Signed)
Initial Nutrition Assessment  DOCUMENTATION CODES:   Non-severe (moderate) malnutrition in context of social or environmental circumstances  INTERVENTION:  Recommend liberalizing diet to regular.  Provide Ensure Enlive po TID, each supplement provides 350 kcal and 20 grams of protein.  Provide Ocuvite daily for wound healing (provides zinc, vitamin A, vitamin C, Vitamin E, copper, and selenium).  NUTRITION DIAGNOSIS:   Moderate Malnutrition related to social / environmental circumstances(inadequate oral intake) as evidenced by moderate fat depletion, moderate muscle depletion.  GOAL:   Patient will meet greater than or equal to 90% of their needs  MONITOR:   PO intake, Supplement acceptance, Labs, Weight trends, Skin, I & O's  REASON FOR ASSESSMENT:   Consult Wound healing  ASSESSMENT:   79 year old male with PMHx of HTN, GERD, PVD, depression, hx CVA who is admitted with left sided weakness, left foot wound with osteomyelitis.   Met with patient at bedside. He reports he has had a poor appetite for a long time now. He is only able to eat one meal per day and it is usually small. He reports it is usually a hamburger. Discussed increased calorie and protein needs for wound healing. Patient is amenable to drinking oral nutrition supplements between meals. Also encouraged intake of more meals throughout the day, even if they are small at first. Encouraged high protein intake.   Patient reports he weighs between 150-170 lbs. RD obtained bed scale weight today of 73.8 kg (162.7 lbs). According to chart patient was 84.8 kg on 02/10/2019. He has lost 11 kg (13% body weight) over the past year, which is not significant for time frame.  Medications reviewed and include: folic acid 1 mg daily, melatonin 5 mg QHS, Flagyl, pantoprazole, vitamin B12 1000 micrograms daily, ceftriaxone, vancomycin.  Labs reviewed.  NUTRITION - FOCUSED PHYSICAL EXAM:    Most Recent Value  Orbital Region   Moderate depletion  Upper Arm Region  Severe depletion  Thoracic and Lumbar Region  Moderate depletion  Buccal Region  Moderate depletion  Temple Region  Moderate depletion  Clavicle Bone Region  Moderate depletion  Clavicle and Acromion Bone Region  Moderate depletion  Scapular Bone Region  Mild depletion  Dorsal Hand  Mild depletion  Patellar Region  Moderate depletion  Anterior Thigh Region  Moderate depletion  Posterior Calf Region  Moderate depletion  Edema (RD Assessment)  None  Hair  Reviewed  Eyes  Reviewed  Mouth  Reviewed  Skin  Reviewed  Nails  Reviewed     Diet Order:   Diet Order            Diet heart healthy/carb modified Room service appropriate? Yes; Fluid consistency: Thin  Diet effective now             EDUCATION NEEDS:   Education needs have been addressed  Skin:  Skin Assessment: Skin Integrity Issues:(wounds to left third toe and left heel)  Last BM:  Unknown  Height:   Ht Readings from Last 1 Encounters:  01/18/20 5' 10" (1.778 m)   Weight:   Wt Readings from Last 1 Encounters:  01/19/20 73.8 kg   Ideal Body Weight:  75.5 kg  BMI:  Body mass index is 23.34 kg/m.  Estimated Nutritional Needs:   Kcal:  2000-2200  Protein:  100-110 grams  Fluid:  1.8 L/day  Jacklynn Barnacle, MS, RD, LDN Pager number available on Amion

## 2020-01-19 NOTE — Progress Notes (Signed)
*  PRELIMINARY RESULTS* Echocardiogram 2D Echocardiogram has been performed.  Wallie Char Deyjah Kindel 01/19/2020, 9:20 AM

## 2020-01-19 NOTE — Progress Notes (Signed)
PT Cancellation Note  Patient Details Name: Alexander Alvarado MRN: [id number] DOB: 12-25-1940   Cancelled Treatment:    Reason Eval/Treat Not Completed: Other (comment): Pt with multiple upcoming procedures scheduled including possible revascularization of the LLE and toe amputation.   Per MD request will complete PT orders at this time but will reassess pt upon completion of pending surgical procedures upon receipt of new PT orders.    Linus Salmons PT, DPT 01/19/20, 3:08 PM

## 2020-01-19 NOTE — Progress Notes (Signed)
Pharmacy Antibiotic Note  Alexander Alvarado is a 79 y.o. male admitted on 01/18/2020. Concern for diabetic foot infection. Pharmacy has been consulted for vancomycin dosing.  Scr (mg/dL): 0.61 > 0.69  Plan: Continue Vancomycin 1750 mg IV q24h. AUC goal 400-550 Expected AUC 498.4  Expected Cssmin 9.0  Height: 5\' 10"  (177.8 cm) Weight: 150 lb (68 kg) IBW/kg (Calculated) : 73  Temp (24hrs), Avg:98.3 F (36.8 C), Min:98.1 F (36.7 C), Max:98.6 F (37 C)  Recent Labs  Lab 01/18/20 1246 01/19/20 0513  WBC 14.1* 11.1*  CREATININE 0.61 0.69  LATICACIDVEN 1.9  --     Estimated Creatinine Clearance: 75.6 mL/min (by C-G formula based on SCr of 0.69 mg/dL).    No Known Allergies  Antimicrobials this admission: Ceftriaxone 3/29 >> Vancomycin 3/29 >>  Dose adjustments this admission: NA  Microbiology results: 3/29 Wound Cx: pending  Thank you for allowing pharmacy to be a part of this patient's care.  Rowland Lathe, PharmD 01/19/2020 8:30 AM

## 2020-01-19 NOTE — Progress Notes (Signed)
OT Cancellation Note  Patient Details Name: MATHEUS SPORN MRN: [id number] DOB: [birth year]   Cancelled Treatment:    Reason Eval/Treat Not Completed: Medical issues which prohibited therapy;Other (comment). Thank you for the OT consult. Order received and chart reviewed. Pt noted to be pending multiple specialty consults to determine POC including vascular sx and neurology. Will hold OT evaluation at this time and initiate services as POC established and pt medically appropriate for therapy.   Shara Blazing, M.S., OTR/L Ascom: [phone] 01/19/20, 8:27 AM

## 2020-01-19 NOTE — Consult Note (Signed)
[facility] Vascular Consult Note  MRN : [id number]  ARREN EDLUND is a 79 y.o. (09/27/1941) male who presents with chief complaint of  Chief Complaint  Patient presents with  . Cellulitis   History of Present Illness:  The patient is a 30 with multiple medical issues including chronic wounds to the left foot and daily tobacco abuse who presented to the [facility] emergency department via EMS from home complaining of "left-sided weakness".  Patient with known history of chronic wounds to the left foot.  Specifically, located to the left third toe and left heel.  He has a past medical history of amputation in the past.  Patient notes increased pain to the left foot.  Denies any significant claudication or rest pain.  Patient notes symptoms have been present for for about 2 months.  States they have progressively worsened over the last 2 months, becoming larger in the size and foul-smelling.  He denies any fever, nausea vomiting.  He denies any shortness of breath or chest pain.  Vascular surgery was consulted by Dr. Luana Shu for possible endovascular intervention.  Current Facility-Administered Medications  Medication Dose Route Frequency Provider Last Rate Last Admin  . acetaminophen (TYLENOL) tablet 650 mg  650 mg Oral Q6H PRN Lorella Nimrod, MD   650 mg at 01/18/20 2127   Or  . acetaminophen (TYLENOL) suppository 650 mg  650 mg Rectal Q6H PRN Lorella Nimrod, MD      . amLODipine (NORVASC) tablet 10 mg  10 mg Oral Daily Lorella Nimrod, MD   10 mg at 01/19/20 0745  . aspirin EC tablet 81 mg  81 mg Oral Daily Lorella Nimrod, MD   81 mg at 01/19/20 0744  . atorvastatin (LIPITOR) tablet 20 mg  20 mg Oral QHS Lorella Nimrod, MD   20 mg at 01/18/20 2127  . cefTRIAXone (ROCEPHIN) 2 g in sodium chloride 0.9 % 100 mL IVPB  2 g Intravenous Q24H Lorella Nimrod, MD   Stopped at 01/18/20 2128  . enoxaparin (LOVENOX) injection 40 mg  40 mg Subcutaneous Q24H  Lorella Nimrod, MD   40 mg at 01/18/20 2127  . [START ON [id number] folic acid (FOLVITE) tablet 1 mg  1 mg Oral Daily Lorella Nimrod, MD      . ketorolac (TORADOL) 15 MG/ML injection 15 mg  15 mg Intravenous Q6H PRN Lorella Nimrod, MD   15 mg at 01/19/20 0749  . metroNIDAZOLE (FLAGYL) tablet 500 mg  500 mg Oral Q8H Lorella Nimrod, MD   500 mg at 01/19/20 0744  . ondansetron (ZOFRAN) tablet 4 mg  4 mg Oral Q6H PRN Lorella Nimrod, MD       Or  . ondansetron (ZOFRAN) injection 4 mg  4 mg Intravenous Q6H PRN Lorella Nimrod, MD      . pantoprazole (PROTONIX) EC tablet 40 mg  40 mg Oral Daily Lorella Nimrod, MD   40 mg at 01/19/20 0745  . polyethylene glycol (MIRALAX / GLYCOLAX) packet 17 g  17 g Oral Daily PRN Lorella Nimrod, MD      . sodium chloride flush (NS) 0.9 % injection 3 mL  3 mL Intravenous Q12H Lorella Nimrod, MD   3 mL at 01/19/20 0747  . vancomycin (VANCOREADY) IVPB 1750 mg/350 mL  1,750 mg Intravenous Q24H Lorella Nimrod, MD      . Derrill Memo ON 01/20/2020] vitamin B-12 (CYANOCOBALAMIN) tablet 1,000 mcg  1,000 mcg Oral Daily Lorella Nimrod, MD  Past Medical History:  Diagnosis Date  . Depression   . GERD (gastroesophageal reflux disease)   . High cholesterol   . Hypertension   . PVD (peripheral vascular disease) ([place])   . Stroke [facility])    left side weakness   Past Surgical History:  Procedure Laterality Date  . AMPUTATION Left 08/31/2015   Procedure: AMPUTATION DIGIT left 2nd toe ;  Surgeon: Samara Deist, DPM;  Location: [facility];  Service: Podiatry;  Laterality: Left;  . AMPUTATION TOE Left 08/2015   second toe  . CHOLECYSTECTOMY    . HIP ARTHROPLASTY Right 09/11/2019   Procedure: ARTHROPLASTY BIPOLAR HIP (HEMIARTHROPLASTY);  Surgeon: Leim Fabry, MD;  Location: [facility];  Service: Orthopedics;  Laterality: Right;  . LUMBAR DISC SURGERY    . VASCULAR SURGERY Left 2011   x 5 to left leg   Social History Social History   Tobacco Use  . Smoking status: Current Every Day  Smoker    Packs/day: 0.50    Types: Cigarettes  . Smokeless tobacco: Never Used  . Tobacco comment: 3-4 ciggs daily  Substance Use Topics  . Alcohol use: No    Alcohol/week: 0.0 standard drinks  . Drug use: No   Family History Family History  Problem Relation Age of Onset  . CAD Mother   Denies family history of peripheral artery disease, venous disease or renal disease.  No Known Allergies  REVIEW OF SYSTEMS (Negative unless checked)  Constitutional: [] Weight loss  [] Fever  [] Chills Cardiac: [] Chest pain   [] Chest pressure   [] Palpitations   [] Shortness of breath when laying flat   [] Shortness of breath at rest   [] Shortness of breath with exertion. Vascular:  [] Pain in legs with walking   [] Pain in legs at rest   [] Pain in legs when laying flat   [] Claudication   [x] Pain in feet when walking  [x] Pain in feet at rest  [x] Pain in feet when laying flat   [] History of DVT   [] Phlebitis   [] Swelling in legs   [] Varicose veins   [x] Non-healing ulcers Pulmonary:   [] Uses home oxygen   [] Productive cough   [] Hemoptysis   [] Wheeze  [] COPD   [] Asthma Neurologic:  [] Dizziness  [] Blackouts   [] Seizures   [] History of stroke   [] History of TIA  [] Aphasia   [] Temporary blindness   [] Dysphagia   [] Weakness or numbness in arms   [] Weakness or numbness in legs Musculoskeletal:  [] Arthritis   [] Joint swelling   [] Joint pain   [] Low back pain Hematologic:  [] Easy bruising  [] Easy bleeding   [] Hypercoagulable state   [] Anemic  [] Hepatitis Gastrointestinal:  [] Blood in stool   [] Vomiting blood  [] Gastroesophageal reflux/heartburn   [] Difficulty swallowing. Genitourinary:  [] Chronic kidney disease   [] Difficult urination  [] Frequent urination  [] Burning with urination   [] Blood in urine Skin:  [] Rashes   [x] Ulcers   [x] Wounds Psychological:  [] History of anxiety   []  History of major depression.  Physical Examination  Vitals:   01/18/20 2200 01/18/20 2344 01/19/20 0524 01/19/20 0811  BP: 132/66  133/64 135/66 135/71  Pulse: (!) 58 (!) 52 (!) 53 62  Resp: 19 17 17 17   Temp:  98.3 F (36.8 C) 98.1 F (36.7 C) 98.1 F (36.7 C)  TempSrc:  Oral Oral Oral  SpO2: (!) 88% 99% 99% 97%  Weight:      Height:       Body mass index is 21.52 kg/m. Gen:  WD/WN, NAD Head: [place]/AT, No  temporalis wasting. Prominent temp pulse not noted. Ear/Nose/Throat: Hearing grossly intact, nares w/o erythema or drainage, oropharynx w/o Erythema/Exudate Eyes: Sclera non-icteric, conjunctiva clear Neck: Trachea midline.  No JVD.  Pulmonary:  Good air movement, respirations not labored, equal bilaterally.  Cardiac: RRR, normal S1, S2. Vascular:  Vessel Right Left  Radial Palpable Palpable  Ulnar Palpable Palpable  Brachial Palpable Palpable  Carotid Palpable, without bruit Palpable, without bruit  Aorta Not palpable N/A  Femoral Palpable Palpable  Popliteal Palpable Palpable  PT Palpable Non-Palpable  DP Palpable Non-Palpable   Left lower extremity: Thigh soft.  Calf soft.  Extremities warm distally in toes.  Hard to palpate pedal pulses.  Motor/sensory is intact.  Wound noted to third toe and heel.      Document Information Photos    01/18/2020 18:00  Attached To:  Hospital Encounter on 01/18/20  Source Information Lorella Nimrod, MD  [facility]   Gastrointestinal: soft, non-tender/non-distended. No guarding/reflex.  Musculoskeletal: M/S 5/5 throughout.  Extremities without ischemic changes.  No deformity or atrophy. No edema. Neurologic: Sensation grossly intact in extremities.  Symmetrical.  Speech is fluent. Motor exam as listed above. Psychiatric: Judgment intact, Mood & affect appropriate for pt's clinical situation. Dermatologic: As above Lymph : No Cervical, Axillary, or Inguinal lymphadenopathy.  CBC Lab Results  Component Value Date   WBC 11.1 (H) 01/19/2020   HGB 16.4 01/19/2020   HCT 49.6 01/19/2020   MCV 93.6 01/19/2020   PLT 253 01/19/2020   BMET     Component Value Date/Time   NA 138 01/19/2020 0513   NA 138 02/10/2019 0852   NA 138 07/15/2012 0447   K 3.9 01/19/2020 0513   K 3.3 (L) 07/15/2012 0447   CL 101 01/19/2020 0513   CL 104 07/15/2012 0447   CO2 27 01/19/2020 0513   CO2 23 07/15/2012 0447   GLUCOSE 90 01/19/2020 0513   GLUCOSE 79 07/15/2012 0447   BUN 12 01/19/2020 0513   BUN 9 02/10/2019 0852   BUN 18 07/15/2012 0447   CREATININE 0.69 01/19/2020 0513   CREATININE 0.79 07/15/2012 0447   CALCIUM 8.9 01/19/2020 0513   CALCIUM 8.7 07/15/2012 0447   GFRNONAA >60 01/19/2020 0513   GFRNONAA >60 07/15/2012 0447   GFRAA >60 01/19/2020 0513   GFRAA >60 07/15/2012 0447   Estimated Creatinine Clearance: 75.6 mL/min (by C-G formula based on SCr of 0.69 mg/dL).  COAG Lab Results  Component Value Date   INR 1.1 09/10/2019   Radiology CT Head Wo Contrast  Result Date: 01/18/2020 CLINICAL DATA:  Left-sided weakness EXAM: CT HEAD WITHOUT CONTRAST TECHNIQUE: Contiguous axial images were obtained from the base of the skull through the vertex without intravenous contrast. COMPARISON:  CT brain 09/10/2019 FINDINGS: Brain: No acute territorial infarction, hemorrhage or intracranial mass. Moderate atrophy. Chronic infarcts in the left cerebellum, right greater than left basal ganglia, and right white matter. Hypodensity in the white matter consistent with chronic small vessel ischemic change. Stable ventricular size with mild ex vacuo dilatation of right frontal horn. Vascular: No hyperdense vessels. Vertebral and carotid vascular calcification. Skull: Normal. Negative for fracture or focal lesion. Sinuses/Orbits: No acute finding. Other: None IMPRESSION: 1. No CT evidence for acute intracranial abnormality. 2. Atrophy and chronic small vessel ischemic changes of the white matter. Chronic multifocal lacunar infarcts. Electronically Signed   By: Donavan Foil M.D.   On: 01/18/2020 17:02   DG Foot Complete Left  Result Date: 01/18/2020  CLINICAL DATA:  Dorsal left  third toe wound. Neuropathy EXAM: LEFT FOOT - COMPLETE 3+ VIEW COMPARISON:  None. FINDINGS: Prior second digit amputation at the proximal phalanx. Cortical irregularity involving the dorsal cortex of the third digit proximal phalanx distally. There is a hammertoe deformity involving the third digit. No fracture is seen. No dislocation. Soft tissue swelling without evidence of soft tissue gas. IMPRESSION: Cortical irregularity involving the dorsal cortex of the third digit proximal phalanx raising the suspicion for osteomyelitis in the setting of dorsal skin wound. If further imaging is clinically warranted, an MRI could be performed. Electronically Signed   By: Davina Poke D.O.   On: 01/18/2020 14:00   ECHOCARDIOGRAM COMPLETE  Result Date: 01/19/2020    ECHOCARDIOGRAM REPORT   Patient Name:   Alexander Alvarado Date of Exam: 01/19/2020 Medical Rec #:  [id number]       Height:       70.0 in Accession #:    [id number]      Weight:       150.0 lb Date of Birth:  01/22/1941      BSA:          1.847 m Patient Age:    49 years        BP:           135/66 mmHg Patient Gender: M               HR:           72 bpm. Exam Location:  [facility] Procedure: 2D Echo, Color Doppler and Cardiac Doppler Indications:     I163.9 Stroke  History:         Patient has no prior history of Echocardiogram examinations.                  PVD, Signs/Symptoms:Left sided weakness; Risk                  Factors:Hypertension and HCL.  Sonographer:     Charmayne Sheer RDCS (AE) Referring Phys:  [id number] [facility] AMIN Diagnosing Phys: Harrell Gave End MD  Sonographer Comments: Suboptimal parasternal window and suboptimal subcostal window. IMPRESSIONS  1. Left ventricular ejection fraction, by estimation, is 45%. The left ventricle has mild to moderately decreased function. The left ventricle demonstrates global hypokinesis. Left ventricular diastolic parameters are consistent with Grade I diastolic dysfunction (impaired  relaxation).  2. Right ventricular systolic function is normal. The right ventricular size is normal. Tricuspid regurgitation signal is inadequate for assessing PA pressure.  3. The mitral valve is degenerative. No evidence of mitral valve regurgitation. No evidence of mitral stenosis.  4. The aortic valve was not well visualized. Aortic valve regurgitation is not visualized. Mild aortic valve sclerosis is present, with no evidence of aortic valve stenosis. FINDINGS  Left Ventricle: Left ventricular ejection fraction, by estimation, is 45%. The left ventricle has mild to moderately decreased function. The left ventricle demonstrates global hypokinesis. The left ventricular internal cavity size was normal in size. There is no left ventricular hypertrophy. Abnormal (paradoxical) septal motion, consistent with left bundle branch block. Left ventricular diastolic parameters are consistent with Grade I diastolic dysfunction (impaired relaxation). Right Ventricle: The right ventricular size is normal. Right vetricular wall thickness was not assessed. Right ventricular systolic function is normal. Tricuspid regurgitation signal is inadequate for assessing PA pressure. Left Atrium: Left atrial size was normal in size. Right Atrium: Right atrial size was normal in size. Pericardium: The pericardium was not well visualized. Mitral Valve: The mitral valve  is degenerative in appearance. There is mild thickening of the mitral valve leaflet(s). Moderate mitral annular calcification. No evidence of mitral valve regurgitation. No evidence of mitral valve stenosis. MV peak gradient, 4.2 mmHg. The mean mitral valve gradient is 2.0 mmHg. Tricuspid Valve: The tricuspid valve is not well visualized. Tricuspid valve regurgitation is not demonstrated. Aortic Valve: The aortic valve was not well visualized. Aortic valve regurgitation is not visualized. Mild aortic valve sclerosis is present, with no evidence of aortic valve stenosis. Mild  to moderate aortic valve annular calcification. Aortic valve mean gradient measures 3.0 mmHg. Aortic valve peak gradient measures 6.1 mmHg. Aortic valve area, by VTI measures 1.56 cm. Pulmonic Valve: The pulmonic valve was not well visualized. Pulmonic valve regurgitation is not visualized. No evidence of pulmonic stenosis. Aorta: The aortic root is normal in size and structure. Pulmonary Artery: The pulmonary artery is of normal size. Venous: The inferior vena cava was not well visualized. IAS/Shunts: The interatrial septum was not well visualized.  LEFT VENTRICLE PLAX 2D LVIDd:         4.71 cm      Diastology LVIDs:         3.65 cm      LV e' lateral:   5.44 cm/s LV PW:         0.94 cm      LV E/e' lateral: 11.0 LV IVS:        0.81 cm      LV e' medial:    5.00 cm/s LVOT diam:     2.10 cm      LV E/e' medial:  11.9 LV SV:         32 LV SV Index:   17 LVOT Area:     3.46 cm  LV Volumes (MOD) LV vol d, MOD A4C: 107.0 ml LV vol s, MOD A4C: 49.2 ml LV SV MOD A4C:     107.0 ml LEFT ATRIUM           Index       RIGHT ATRIUM           Index LA diam:      3.90 cm 2.11 cm/m  RA Area:     14.14 cm 7.66 cm/m LA Vol (A4C): 33.2 ml 17.97 ml/m  AORTIC VALVE                   PULMONIC VALVE AV Area (Vmax):    1.54 cm    PV Vmax:       0.88 m/s AV Area (Vmean):   1.60 cm    PV Vmean:      60.300 cm/s AV Area (VTI):     1.56 cm    PV VTI:        0.154 m AV Vmax:           123.00 cm/s PV Peak grad:  3.1 mmHg AV Vmean:          81.200 cm/s PV Mean grad:  2.0 mmHg AV VTI:            0.203 m AV Peak Grad:      6.1 mmHg AV Mean Grad:      3.0 mmHg LVOT Vmax:         54.70 cm/s LVOT Vmean:        37.400 cm/s LVOT VTI:          0.092 m LVOT/AV VTI ratio: 0.45  AORTA Ao Root diam: 3.20 cm  MITRAL VALVE MV Area (PHT): 2.91 cm    SHUNTS MV Peak grad:  4.2 mmHg    Systemic VTI:  0.09 m MV Mean grad:  2.0 mmHg    Systemic Diam: 2.10 cm MV Vmax:       1.03 m/s MV Vmean:      60.0 cm/s MV Decel Time: 261 msec MV E velocity: 59.70 cm/s  MV A velocity: 78.10 cm/s MV E/A ratio:  0.76 Harrell Gave End MD Electronically signed by Nelva Bush MD Signature Date/Time: 01/19/2020/10:29:16 AM    Final    Assessment/Plan The patient is a 79 year old male with multiple medical issues including chronic wounds to the left foot and daily tobacco abuse   1.  Atherosclerotic disease to left lower extremity with chronic wound: Patient with known history of peripheral artery disease requiring toe amputation in the past and ongoing multiple risk factors for atherosclerotic disease including hypertension, hyperlipidemia, diabetes and daily tobacco abuse who presents with chronic wounds to the left foot.  In the setting of known atherosclerotic disease requiring toe amputation in the past and nonhealing wounds would recommend a left lower extremity angiogram with possible intervention and attempt to assess the patient's anatomy and contributing degree of peripheral artery disease.  If appropriate an attempt to revascularize leg can be made at that time.  Procedure, risk and benefit explained to the patient.  All questions answered.  Patient wished to proceed.  We will tentative plan on Friday with Dr. Delana Meyer.  Local wound care.  2.  Hyperlipidemia: On aspirin and statin for medical management. Encouraged good control as its slows the progression of atherosclerotic disease.  3.  Pre-Diabetes: Diet controlled at this time. Encouraged good control as its slows the progression of atherosclerotic disease.  4.  Daily tobacco abuse: We had a discussion for approximately three minutes regarding the absolute need for smoking cessation due to the deleterious nature of tobacco on the vascular system. We discussed the tobacco use would diminish patency of any intervention, and likely significantly worsen progressio of disease. We discussed multiple agents for quitting including replacement therapy or medications to reduce cravings such as Chantix. The patient  voices their understanding of the importance of smoking cessation.  Discussed with Dr. Francene Castle, PA-C  01/19/2020 11:36 AM  This note was created with Dragon medical transcription system.  Any error is purely unintentional.

## 2020-01-20 DIAGNOSIS — E440 Moderate protein-calorie malnutrition: Secondary | ICD-10-CM

## 2020-01-20 LAB — GLUCOSE, CAPILLARY: Glucose-Capillary: 132 mg/dL — ABNORMAL HIGH (ref 70–99)

## 2020-01-20 MED ORDER — INSULIN ASPART 100 UNIT/ML ~~LOC~~ SOLN
0.0000 [IU] | Freq: Three times a day (TID) | SUBCUTANEOUS | Status: DC
  Filled 2020-01-20: qty 1

## 2020-01-20 MED ORDER — DIPHENHYDRAMINE HCL 25 MG PO CAPS
25.0000 mg | ORAL_CAPSULE | Freq: Every evening | ORAL | Status: DC | PRN
  Administered 2020-01-20: 25 mg via ORAL
  Filled 2020-01-20: qty 1

## 2020-01-20 MED ORDER — INSULIN ASPART 100 UNIT/ML ~~LOC~~ SOLN: 0.0000 [IU] | Freq: Every day | SUBCUTANEOUS | Status: DC

## 2020-01-20 NOTE — Progress Notes (Signed)
Consent of the surgery for 01/23/20 was signed by pt. Kept in chart. To inform night RN.

## 2020-01-20 NOTE — Progress Notes (Signed)
PODIATRY / FOOT AND ANKLE SURGERY PROGRESS NOTE  Requesting Physician: Lorella Nimrod MD  Reason for consult: L foot wound  Chief Complaint: L 3rd toe and heel wound   HPI: Alexander Alvarado is a 79 y.o. male resting in bed comfortably.  Patient states that he has not really been able to sleep much since he has been in the hospital.  He also notes that he still has continued pain to the left foot.  Patient denies any other complaints at this time is still awaiting revascularization procedure of the left lower extremity followed by amputation of the left third toe.  Patient denies nausea, vomiting, fever, chills.  PMHx:  Past Medical History:  Diagnosis Date  . Depression   . GERD (gastroesophageal reflux disease)   . High cholesterol   . Hypertension   . PVD (peripheral vascular disease) ([place])   . Stroke [facility])    left side weakness    Surgical Hx:  Past Surgical History:  Procedure Laterality Date  . AMPUTATION Left 08/31/2015   Procedure: AMPUTATION DIGIT left 2nd toe ;  Surgeon: Samara Deist, DPM;  Location: [place];  Service: Podiatry;  Laterality: Left;  . AMPUTATION TOE Left 08/2015   second toe  . CHOLECYSTECTOMY    . HIP ARTHROPLASTY Right 09/11/2019   Procedure: ARTHROPLASTY BIPOLAR HIP (HEMIARTHROPLASTY);  Surgeon: Leim Fabry, MD;  Location: [facility] ORS;  Service: Orthopedics;  Laterality: Right;  . LUMBAR DISC SURGERY    . VASCULAR SURGERY Left 2011   x 5 to left leg    FHx:  Family History  Problem Relation Age of Onset  . CAD Mother     Social History:  reports that he has been smoking cigarettes. He has been smoking about 0.50 packs per day. He has never used smokeless tobacco. He reports that he does not drink alcohol or use drugs.  Allergies: No Known Allergies  Review of Systems: General ROS: negative Respiratory ROS: no cough, shortness of breath, or wheezing Cardiovascular ROS: no chest pain or dyspnea on exertion Gastrointestinal ROS: no  abdominal pain, change in bowel habits, or black or bloody stools Musculoskeletal ROS: positive for - muscular weakness and pain in foot - left Neurological ROS: positive for - numbness/tingling and weakness Dermatological ROS: positive for dry skin, hair changes, nail changes and Left heel and third toe wound  Medications Prior to Admission  Medication Sig Dispense Refill  . acetaminophen (TYLENOL) 325 MG tablet Take 2 tablets (650 mg total) by mouth every 6 (six) hours as needed for mild pain. 100 tablet 2  . amLODipine (NORVASC) 10 MG tablet Take 1 tablet (10 mg total) by mouth daily. 90 tablet 1  . aspirin 81 MG tablet Take 81 mg by mouth daily.     Marland Kitchen atorvastatin (LIPITOR) 20 MG tablet TAKE 1 TABLET BY MOUTH AT BEDTIME 90 tablet 1  . cyanocobalamin 1000 MCG tablet Take 1,000 mcg by mouth daily.     . folic acid (FOLVITE) 1 MG tablet Take 1 mg by mouth daily.     . hydrALAZINE (APRESOLINE) 25 MG tablet Take 1 tablet (25 mg total) by mouth 2 (two) times daily. 180 tablet 3  . metoprolol tartrate (LOPRESSOR) 25 MG tablet Take 1 tablet (25 mg total) by mouth 2 (two) times daily. 180 tablet 3  . omeprazole (PRILOSEC) 40 MG capsule Take 1 capsule (40 mg total) by mouth daily. 90 capsule 3  . quinapril (ACCUPRIL) 40 MG tablet Take 1 tablet (  40 mg total) by mouth daily. 90 tablet 1  . oxyCODONE (OXY IR/ROXICODONE) 5 MG immediate release tablet Take 1 tablet (5 mg total) by mouth every 3 (three) hours as needed for moderate pain (pain score 4-6). (Patient not taking: Reported on 01/18/2020) 30 tablet 0    Physical Exam: General: Alert and oriented.  No apparent distress.  Vascular: DP/PT pulses nonpalpable bilateral.  Capillary fill time does appear to be intact to all remaining digits of both feet.  Mild nonpitting edema to bilateral lower extremities with rubor with dependency of the left lower extremity that is reduced with elevation.  No hair growth present to bilateral lower extremities.  Skin  appears to be warm to touch overall to bilateral lower extremities.  Neuro: Light touch sensation reduced to bilateral lower extremities left greater than right.  Derm: Ulceration present to the left PIPJ over the dorsal aspect of the toe with the PIPJ bone exposed as the head of the proximal phalanx appears to be exposed through the wound.  The bone appears to be hard and intact with no pain on palpation, dark necrotic changes seen over the wound consistent with dry gangrene, no drainage, mild odor, minimal periwound erythema, mild edema.  Wound measures approximately 1.5 cm x 1 cm x 0.4 cm to bone  Ulceration present to the posterior lateral aspect the left heel that appears to go to subcutaneous depth only, mild hyperkeratotic border around the periphery of the wound, mild serous drainage, no erythema, mild edema, minimal odor.  Wound measures approximately 2 cm x 1.5 x 0.2 cm.  Nails x9 appear to be thickened, elongated, dystrophic and brittle with subungual debris.  Notable dirt and grime to the plantar aspects of both feet, not hygienic.  MSK: Pain on palpation to the posterior aspect the left heel around the ulceration site.  Left second toe partial amputation.  Results for orders placed or performed during the hospital encounter of 01/18/20 (from the past 48 hour(s))  Lactic acid, plasma     Status: None   Collection Time: 01/18/20 12:46 PM  Result Value Ref Range   Lactic Acid, Venous 1.9 0.5 - 1.9 mmol/L    Comment: Performed at [facility], [address], [place], [place] [address]  Comprehensive metabolic panel     Status: Abnormal   Collection Time: 01/18/20 12:46 PM  Result Value Ref Range   Sodium 136 135 - 145 mmol/L   Potassium 3.5 3.5 - 5.1 mmol/L   Chloride 98 98 - 111 mmol/L   CO2 27 22 - 32 mmol/L   Glucose, Bld 174 (H) 70 - 99 mg/dL    Comment: Glucose reference range applies only to samples taken after fasting for at least 8 hours.   BUN 11 8 - 23 mg/dL    Creatinine, Ser 0.61 0.61 - 1.24 mg/dL   Calcium 9.2 8.9 - 10.3 mg/dL   Total Protein 7.7 6.5 - 8.1 g/dL   Albumin 4.0 3.5 - 5.0 g/dL   AST 16 15 - 41 U/L   ALT 14 0 - 44 U/L   Alkaline Phosphatase 133 (H) 38 - 126 U/L   Total Bilirubin 0.7 0.3 - 1.2 mg/dL   GFR calc non Af Amer >60 >60 mL/min   GFR calc Af Amer >60 >60 mL/min   Anion gap 11 5 - 15    Comment: Performed at [facility], [address] [place], [place] [address]  CBC with Differential  Status: Abnormal   Collection Time: 01/18/20 12:46 PM  Result Value Ref Range   WBC 14.1 (H) 4.0 - 10.5 K/uL   RBC 5.92 (H) 4.22 - 5.81 MIL/uL   Hemoglobin 18.2 (H) 13.0 - 17.0 g/dL   HCT 54.7 (H) 39.0 - 52.0 %   MCV 92.4 80.0 - 100.0 fL   MCH 30.7 26.0 - 34.0 pg   MCHC 33.3 30.0 - 36.0 g/dL   RDW 13.6 11.5 - 15.5 %   Platelets 279 150 - 400 K/uL   nRBC 0.0 0.0 - 0.2 %   Neutrophils Relative % 76 %   Neutro Abs 10.7 (H) 1.7 - 7.7 K/uL   Lymphocytes Relative 17 %   Lymphs Abs 2.4 0.7 - 4.0 K/uL   Monocytes Relative 7 %   Monocytes Absolute 0.9 0.1 - 1.0 K/uL   Eosinophils Relative 0 %   Eosinophils Absolute 0.0 0.0 - 0.5 K/uL   Basophils Relative 0 %   Basophils Absolute 0.1 0.0 - 0.1 K/uL   Immature Granulocytes 0 %   Abs Immature Granulocytes 0.05 0.00 - 0.07 K/uL    Comment: Performed at [facility], [address] [place], [place] [address]  SARS CORONAVIRUS 2 (TAT 6-24 HRS) Nasopharyngeal Nasopharyngeal Swab     Status: None   Collection Time: 01/18/20  4:26 PM   Specimen: Nasopharyngeal Swab  Result Value Ref Range   SARS Coronavirus 2 NEGATIVE NEGATIVE    Comment: (NOTE) SARS-CoV-2 target nucleic acids are NOT DETECTED. The SARS-CoV-2 RNA is generally detectable in upper and lower respiratory specimens during the acute phase of infection. Negative results do not preclude SARS-CoV-2 infection, do not rule out co-infections with other pathogens, and should not be used as the sole basis  for treatment or other patient management decisions. Negative results must be combined with clinical observations, patient history, and epidemiological information. The expected result is Negative. Fact Sheet for Patients: SugarRoll.be Fact Sheet for Healthcare Providers: https://www.woods-mathews.com/ This test is not yet approved or cleared by the [place] FDA and  has been authorized for detection and/or diagnosis of SARS-CoV-2 by FDA under an Emergency Use Authorization (EUA). This EUA will remain  in effect (meaning this test can be used) for the duration of the COVID-19 declaration under Section 56 4(b)(1) of the Act, 21 U.S.C. section 360bbb-3(b)(1), unless the authorization is terminated or revoked sooner. Performed at [facility], [place] [address] [place], [place] [address]   Wound or Superficial Culture     Status: None (Preliminary result)   Collection Time: 01/18/20  4:53 PM   Specimen: Wound  Result Value Ref Range   Specimen Description      WOUND Performed at [facility], [address] [place], [place] [address]    Special Requests      LEFT 3RD TOE Performed at [facility], [place], [place] [address]    Gram Stain      NO WBC SEEN MODERATE GRAM POSITIVE COCCI MODERATE GRAM POSITIVE RODS RARE GRAM NEGATIVE RODS    Culture      FEW STAPHYLOCOCCUS AUREUS SUSCEPTIBILITIES TO FOLLOW Performed at [facility], [place] [address], [place], [place] [address]    Report Status PENDING   Wound or Superficial Culture     Status: None (Preliminary result)   Collection Time: 01/18/20  4:53 PM   Specimen: Wound  Result Value Ref Range   Specimen Description  WOUND Performed at [facility], [address] [place], [place] [address]    Special Requests      LEFT HEEL Performed at [facility], [place], [place] [address]    Gram  Stain NO WBC SEEN RARE GRAM POSITIVE COCCI     Culture      FEW STAPHYLOCOCCUS AUREUS SUSCEPTIBILITIES TO FOLLOW Performed at [facility], [place] [address] [place], [place] [address]    Report Status PENDING   Sedimentation rate     Status: None   Collection Time: 01/18/20  6:57 PM  Result Value Ref Range   Sed Rate 7 0 - 20 mm/hr    Comment: Performed at [facility], [place] [address] [place], [place] [address]  C-reactive protein     Status: Abnormal   Collection Time: 01/18/20  6:57 PM  Result Value Ref Range   CRP 3.3 (H) <1.0 mg/dL    Comment: Performed at [facility], [place] [address], [place], [place] [address]  Prealbumin     Status: Abnormal   Collection Time: 01/18/20  6:57 PM  Result Value Ref Range   Prealbumin 12.6 (L) 18 - 38 mg/dL    Comment: Performed at [place] [address], [place], [place] [address]  Lipid panel     Status: Abnormal   Collection Time: 01/19/20  5:13 AM  Result Value Ref Range   Cholesterol 105 0 - 200 mg/dL   Triglycerides 175 (H) <150 mg/dL   HDL 28 (L) >40 mg/dL   Total CHOL/HDL Ratio 3.8 RATIO   VLDL 35 0 - 40 mg/dL   LDL Cholesterol 42 0 - 99 mg/dL    Comment:        Total Cholesterol/HDL:CHD Risk Coronary Heart Disease Risk Table                     Men   Women  1/2 Average Risk   3.4   3.3  Average Risk       5.0   4.4  2 X Average Risk   9.6   7.1  3 X Average Risk  23.4   11.0        Use the calculated Patient Ratio above and the CHD Risk Table to determine the patient's CHD Risk.        ATP III CLASSIFICATION (LDL):  <100     mg/dL   Optimal  100-129  mg/dL   Near or Above                    Optimal  130-159  mg/dL   Borderline  160-189  mg/dL   High  >190     mg/dL   Very High Performed at [facility], [address] [place], [place] [address]   Hemoglobin A1c     Status: Abnormal   Collection Time: 01/19/20  5:13 AM  Result Value Ref Range   Hgb A1c MFr Bld 5.8 (H)  4.8 - 5.6 %    Comment: (NOTE) Pre diabetes:          5.7%-6.4% Diabetes:              >6.4% Glycemic control for   <7.0% adults with diabetes    Mean Plasma Glucose 119.76 mg/dL    Comment: Performed at [facility] [address] [place], [place] [id number]  Basic metabolic panel  Status: None   Collection Time: 01/19/20  5:13 AM  Result Value Ref Range   Sodium 138 135 - 145 mmol/L   Potassium 3.9 3.5 - 5.1 mmol/L   Chloride 101 98 - 111 mmol/L   CO2 27 22 - 32 mmol/L   Glucose, Bld 90 70 - 99 mg/dL    Comment: Glucose reference range applies only to samples taken after fasting for at least 8 hours.   BUN 12 8 - 23 mg/dL   Creatinine, Ser 0.69 0.61 - 1.24 mg/dL   Calcium 8.9 8.9 - 10.3 mg/dL   GFR calc non Af Amer >60 >60 mL/min   GFR calc Af Amer >60 >60 mL/min   Anion gap 10 5 - 15    Comment: Performed at [facility], [address] [place], [place] [address]  CBC     Status: Abnormal   Collection Time: 01/19/20  5:13 AM  Result Value Ref Range   WBC 11.1 (H) 4.0 - 10.5 K/uL   RBC 5.30 4.22 - 5.81 MIL/uL   Hemoglobin 16.4 13.0 - 17.0 g/dL   HCT 49.6 39.0 - 52.0 %   MCV 93.6 80.0 - 100.0 fL   MCH 30.9 26.0 - 34.0 pg   MCHC 33.1 30.0 - 36.0 g/dL   RDW 13.6 11.5 - 15.5 %   Platelets 253 150 - 400 K/uL   nRBC 0.0 0.0 - 0.2 %    Comment: Performed at [facility], [address] [place], [place] [address]  Urinalysis, Complete w Microscopic     Status: Abnormal   Collection Time: 01/19/20  3:16 PM  Result Value Ref Range   Color, Urine YELLOW (A) YELLOW   APPearance CLEAR (A) CLEAR   Specific Gravity, Urine 1.010 1.005 - 1.030   pH 7.0 5.0 - 8.0   Glucose, UA NEGATIVE NEGATIVE mg/dL   Hgb urine dipstick NEGATIVE NEGATIVE   Bilirubin Urine NEGATIVE NEGATIVE   Ketones, ur NEGATIVE NEGATIVE mg/dL   Protein, ur NEGATIVE NEGATIVE mg/dL   Nitrite NEGATIVE NEGATIVE   Leukocytes,Ua SMALL (A) NEGATIVE   RBC / HPF 0-5 0 - 5 RBC/hpf   WBC,  UA 0-5 0 - 5 WBC/hpf   Bacteria, UA NONE SEEN NONE SEEN   Squamous Epithelial / LPF NONE SEEN 0 - 5    Comment: Performed at [facility], [place], [place] [address]   CT Head Wo Contrast  Result Date: 01/18/2020 CLINICAL DATA:  Left-sided weakness EXAM: CT HEAD WITHOUT CONTRAST TECHNIQUE: Contiguous axial images were obtained from the base of the skull through the vertex without intravenous contrast. COMPARISON:  CT brain 09/10/2019 FINDINGS: Brain: No acute territorial infarction, hemorrhage or intracranial mass. Moderate atrophy. Chronic infarcts in the left cerebellum, right greater than left basal ganglia, and right white matter. Hypodensity in the white matter consistent with chronic small vessel ischemic change. Stable ventricular size with mild ex vacuo dilatation of right frontal horn. Vascular: No hyperdense vessels. Vertebral and carotid vascular calcification. Skull: Normal. Negative for fracture or focal lesion. Sinuses/Orbits: No acute finding. Other: None IMPRESSION: 1. No CT evidence for acute intracranial abnormality. 2. Atrophy and chronic small vessel ischemic changes of the white matter. Chronic multifocal lacunar infarcts. Electronically Signed   By: Donavan Foil M.D.   On: 01/18/2020 17:02   US ARTERIAL ABI (SCREENING LOWER EXTREMITY)  Result Date: 01/19/2020 CLINICAL DATA:  79 year old male with peripheral arterial disease and bilateral surgical bypasses. EXAM: NONINVASIVE PHYSIOLOGIC VASCULAR  STUDY OF BILATERAL LOWER EXTREMITIES TECHNIQUE: Evaluation of both lower extremities were performed at rest, including calculation of ankle-brachial indices with single level Doppler, pressure and pulse volume recording. COMPARISON:  None. FINDINGS: Right ABI:  0.61 Left ABI:  0.53 Right Lower Extremity: Abnormal biphasic arterial waveforms at the ankle. Left Lower Extremity: Abnormal monophasic arterial waveforms at the ankle. 0.5-0.79 Moderate PAD IMPRESSION:  Abnormal bilateral resting ankle-brachial indices consistent with at least moderate underlying peripheral arterial disease. The abnormalities are slightly worse on the left than the right. Signed, Criselda Peaches, MD, [facility] Radiology Electronically Signed   By: Jacqulynn Cadet M.D.   On: 01/19/2020 14:10   DG Foot Complete Left  Result Date: 01/18/2020 CLINICAL DATA:  Dorsal left third toe wound. Neuropathy EXAM: LEFT FOOT - COMPLETE 3+ VIEW COMPARISON:  None. FINDINGS: Prior second digit amputation at the proximal phalanx. Cortical irregularity involving the dorsal cortex of the third digit proximal phalanx distally. There is a hammertoe deformity involving the third digit. No fracture is seen. No dislocation. Soft tissue swelling without evidence of soft tissue gas. IMPRESSION: Cortical irregularity involving the dorsal cortex of the third digit proximal phalanx raising the suspicion for osteomyelitis in the setting of dorsal skin wound. If further imaging is clinically warranted, an MRI could be performed. Electronically Signed   By: Davina Poke D.O.   On: 01/18/2020 14:00   ECHOCARDIOGRAM COMPLETE  Result Date: 01/19/2020    ECHOCARDIOGRAM REPORT   Patient Name:   SHANG ADMIRE Date of Exam: 01/19/2020 Medical Rec #:  [id number]       Height:       70.0 in Accession #:    [id number]      Weight:       150.0 lb Date of Birth:  1941/03/08      BSA:          1.847 m Patient Age:    8 years        BP:           135/66 mmHg Patient Gender: M               HR:           72 bpm. Exam Location:  [facility] Procedure: 2D Echo, Color Doppler and Cardiac Doppler Indications:     I163.9 Stroke  History:         Patient has no prior history of Echocardiogram examinations.                  PVD, Signs/Symptoms:Left sided weakness; Risk                  Factors:Hypertension and HCL.  Sonographer:     Charmayne Sheer RDCS (AE) Referring Phys:  [id number] [facility]  AMIN Diagnosing Phys: Harrell Gave End MD  Sonographer Comments: Suboptimal parasternal window and suboptimal subcostal window. IMPRESSIONS  1. Left ventricular ejection fraction, by estimation, is 45%. The left ventricle has mild to moderately decreased function. The left ventricle demonstrates global hypokinesis. Left ventricular diastolic parameters are consistent with Grade I diastolic dysfunction (impaired relaxation).  2. Right ventricular systolic function is normal. The right ventricular size is normal. Tricuspid regurgitation signal is inadequate for assessing PA pressure.  3. The mitral valve is degenerative. No evidence of mitral valve regurgitation. No evidence of mitral stenosis.  4. The aortic valve was not well visualized. Aortic valve regurgitation is not visualized. Mild aortic valve sclerosis is present,  with no evidence of aortic valve stenosis. FINDINGS  Left Ventricle: Left ventricular ejection fraction, by estimation, is 45%. The left ventricle has mild to moderately decreased function. The left ventricle demonstrates global hypokinesis. The left ventricular internal cavity size was normal in size. There is no left ventricular hypertrophy. Abnormal (paradoxical) septal motion, consistent with left bundle branch block. Left ventricular diastolic parameters are consistent with Grade I diastolic dysfunction (impaired relaxation). Right Ventricle: The right ventricular size is normal. Right vetricular wall thickness was not assessed. Right ventricular systolic function is normal. Tricuspid regurgitation signal is inadequate for assessing PA pressure. Left Atrium: Left atrial size was normal in size. Right Atrium: Right atrial size was normal in size. Pericardium: The pericardium was not well visualized. Mitral Valve: The mitral valve is degenerative in appearance. There is mild thickening of the mitral valve leaflet(s). Moderate mitral annular calcification. No evidence of mitral valve  regurgitation. No evidence of mitral valve stenosis. MV peak gradient, 4.2 mmHg. The mean mitral valve gradient is 2.0 mmHg. Tricuspid Valve: The tricuspid valve is not well visualized. Tricuspid valve regurgitation is not demonstrated. Aortic Valve: The aortic valve was not well visualized. Aortic valve regurgitation is not visualized. Mild aortic valve sclerosis is present, with no evidence of aortic valve stenosis. Mild to moderate aortic valve annular calcification. Aortic valve mean gradient measures 3.0 mmHg. Aortic valve peak gradient measures 6.1 mmHg. Aortic valve area, by VTI measures 1.56 cm. Pulmonic Valve: The pulmonic valve was not well visualized. Pulmonic valve regurgitation is not visualized. No evidence of pulmonic stenosis. Aorta: The aortic root is normal in size and structure. Pulmonary Artery: The pulmonary artery is of normal size. Venous: The inferior vena cava was not well visualized. IAS/Shunts: The interatrial septum was not well visualized.  LEFT VENTRICLE PLAX 2D LVIDd:         4.71 cm      Diastology LVIDs:         3.65 cm      LV e' lateral:   5.44 cm/s LV PW:         0.94 cm      LV E/e' lateral: 11.0 LV IVS:        0.81 cm      LV e' medial:    5.00 cm/s LVOT diam:     2.10 cm      LV E/e' medial:  11.9 LV SV:         32 LV SV Index:   17 LVOT Area:     3.46 cm  LV Volumes (MOD) LV vol d, MOD A4C: 107.0 ml LV vol s, MOD A4C: 49.2 ml LV SV MOD A4C:     107.0 ml LEFT ATRIUM           Index       RIGHT ATRIUM           Index LA diam:      3.90 cm 2.11 cm/m  RA Area:     14.14 cm 7.66 cm/m LA Vol (A4C): 33.2 ml 17.97 ml/m  AORTIC VALVE                   PULMONIC VALVE AV Area (Vmax):    1.54 cm    PV Vmax:       0.88 m/s AV Area (Vmean):   1.60 cm    PV Vmean:      60.300 cm/s AV Area (VTI):     1.56 cm  PV VTI:        0.154 m AV Vmax:           123.00 cm/s PV Peak grad:  3.1 mmHg AV Vmean:          81.200 cm/s PV Mean grad:  2.0 mmHg AV VTI:            0.203 m AV Peak Grad:       6.1 mmHg AV Mean Grad:      3.0 mmHg LVOT Vmax:         54.70 cm/s LVOT Vmean:        37.400 cm/s LVOT VTI:          0.092 m LVOT/AV VTI ratio: 0.45  AORTA Ao Root diam: 3.20 cm MITRAL VALVE MV Area (PHT): 2.91 cm    SHUNTS MV Peak grad:  4.2 mmHg    Systemic VTI:  0.09 m MV Mean grad:  2.0 mmHg    Systemic Diam: 2.10 cm MV Vmax:       1.03 m/s MV Vmean:      60.0 cm/s MV Decel Time: 261 msec MV E velocity: 59.70 cm/s MV A velocity: 78.10 cm/s MV E/A ratio:  0.76 Christopher End MD Electronically signed by Nelva Bush MD Signature Date/Time: 01/19/2020/10:29:16 AM    Final     Blood pressure (!) 151/79, pulse 76, temperature 98.5 F (36.9 C), temperature source Oral, resp. rate 17, height 5\' 10"  (1.778 m), weight 73.8 kg, SpO2 96 %.  Assessment 1. Osteomyelitis left third toe with chronic ulceration over the PIPJ of the dorsal aspect, dry gangrene 2. Left posterior heel subcutaneous ulceration 3. PVD 4. Neuropathy  Plan -Patient seen and examined -Continue dressing changes daily to the left foot as instructed. -Continue Prevalon boots at all times when resting in bed or in chair. -Appreciate vascular recommendations.  Plan for intervention on Friday. -Appreciate medicine recommendations for antibiotic therapy.  Culture results so far growing gram-positive cocci, gram-negative rods, gram-positive rods - staph aureus.  Susceptibilities pending. WBC downtrending on admission. -Discussed with patient that he will need to have a left third toe total amputation with potential metatarsal head resection if the infection appears to be going proximal to the proximal phalanx base.  Discussed all treatment options with the patient both conservative and surgical attempts at correction at this time patient is elected for amputation of the left third toe (partial third ray) and wound debridement to the left posterior heel.  All questions answered including postoperative course in detail.  Patient will  have to remain partial weightbearing with heel contact only in a surgical shoe after procedure. -Discussed with patient that procedure will need to take place after vascular intervention to improve blood flow status to left lower extremity to increase the odds of the amputation healing.  Patient understands. -Patient schedule for procedure on the morning of 01/23/20, pt to be NPO midnight prior to procedure.  Pending vascular plan.  Caroline More, DPM 01/20/2020, 12:25 PM

## 2020-01-20 NOTE — Progress Notes (Addendum)
Pharmacy Antibiotic Note  Alexander Alvarado is a 79 y.o. male admitted on 01/18/2020. Concern for diabetic foot infection. Pharmacy has been consulted for vancomycin dosing.  Scr (mg/dL): 0.61 > 0.69  Yesterday, vascular surgery d/w patient left lower extremity angiogram and may attempt to revascularize leg (if appropriate). Also, podiatry d/w amputation of the left third toe (partial third ray) and wound debridement to the left posterior heel. The amputation will likely be this weekend or early next week. Pharmacy will continue to follow utility of Vancomycin pending susceptibilities.    Morning serum creatinine lab orders for today were discontinued yesterday for unclear reasons. Additionally, Vancomycin was not given yesterday.   Plan: Continue Vancomycin 1750 mg IV q24h. Will order serum creatinine for tomorrow.  AUC goal 400-550 Expected AUC 498.4  Expected Cssmin 9.0  Height: 5\' 10"  (177.8 cm) Weight: 162 lb 11.2 oz (73.8 kg)(bed scale) IBW/kg (Calculated) : 73  Temp (24hrs), Avg:98.3 F (36.8 C), Min:97.8 F (36.6 C), Max:98.6 F (37 C)  Recent Labs  Lab 01/18/20 1246 01/19/20 0513  WBC 14.1* 11.1*  CREATININE 0.61 0.69  LATICACIDVEN 1.9  --     Estimated Creatinine Clearance: 81.1 mL/min (by C-G formula based on SCr of 0.69 mg/dL).    No Known Allergies  Antimicrobials this admission: Ceftriaxone 3/29 >> Vancomycin 3/29 >> Metronidazole 3/30 >> Zosyn 3/29 >> 3/29  Dose adjustments this admission: NA  Microbiology results: 3/29 Wound Cx: moderate GPC/GPR; STAPHYLOCOCCUS AUREUS   Thank you for allowing pharmacy to be a part of this patient's care.  Rowland Lathe, PharmD 01/20/2020 10:58 AM

## 2020-01-20 NOTE — Evaluation (Signed)
Occupational Therapy Evaluation Patient Details Name: Alexander Alvarado MRN: [id number] DOB: 1941-01-28 Today's Date: 01/20/2020    History of Present Illness Alexander Alvarado is a 79 y.o. male who presents with multiple medical issues.  Patient arrived via EMS due to left-sided weakness and concern for potential CVA.  Patient was also noted to have a ulceration to the dorsal aspect of the left third toe with bone exposed as well as posterior aspect left heel.  Patient notes symptoms have been present for for about 2 months.  States they have progressively worsened over the last 2 months, becoming larger in the size and foul-smelling. Pt being followed by vascular surgery with plan for potential surgical intervention later in the week.  PMH includes CVA with residual L sided weakness, PVD, HTN, HLD, R hip hemiarthroplastly.   Clinical Impression   Alexander Alvarado was seen for OT evaluation this date. Prior to hospital admission, pt was living alone at home. He states he is modified independent for BADL management, and using a 4WW for all household mobility. He states that he does not leave the house at all, and has a good friend who assists him with grocery shopping. Currently pt demonstrates impairments as described below (See OT problem list) which functionally limit his ability to perform ADL/self-care tasks. Pt currently requires moderate to maximal assistance to perform LB BADL management at bed level, set-up/supervision assist to perform UB ADL mgt seated or at bed-level.  Pt would benefit from skilled OT serves to address noted impairments and functional limitations (see below for any additional details) in order to maximize safety and independence while minimizing falls risk and caregiver burden. Upon hospital discharge, recommend STR to maximize pt safety and return to PLOF.      Follow Up Recommendations  SNF    Equipment Recommendations  Other (comment)(TBD at next venue of care)     Recommendations for Other Services       Precautions / Restrictions Precautions Precautions: Fall Precaution Comments: High Fall Restrictions Weight Bearing Restrictions: Yes LLE Weight Bearing: Non weight bearing      Mobility Bed Mobility Overal bed mobility: Needs Assistance Bed Mobility: Rolling Rolling: Min assist         General bed mobility comments: Mobility assessment limited this date. Pt noted to be able to roll side to side to adjust position in bed with min assist.  Transfers                 General transfer comment: Deferred.    Balance Overall balance assessment: Mild deficits observed, not formally tested                                         ADL either performed or assessed with clinical judgement   ADL Overall ADL's : Needs assistance/impaired                                       General ADL Comments: Pt is functionally limited by generalized weakness, decreased activity tolerance, increased pain and NWB status through his LLE. He currently requires bed level MOD/MAX assist for toileting, and lower body ADL mgt. Could perform seated ADL tasks with assist to prop LLE to maintain NWB status. Set-up assist for seated/bed level UB ADL/groomng tasks.  Vision Baseline Vision/History: Wears glasses Wears Glasses: At all times Patient Visual Report: No change from baseline       Perception     Praxis      Pertinent Vitals/Pain Pain Assessment: Faces Faces Pain Scale: Hurts a little bit Pain Location: Stomach/abdomen pt c/o not having a BM "in days" Pain Descriptors / Indicators: Discomfort;Grimacing Pain Intervention(s): Limited activity within patient's tolerance;Monitored during session;Patient requesting pain meds-RN notified     Hand Dominance Right   Extremity/Trunk Assessment Upper Extremity Assessment Upper Extremity Assessment: Generalized weakness;LUE deficits/detail;RUE  deficits/detail RUE Deficits / Details: WFLs LUE Deficits / Details: Pt LUE notably weaker than RUE this date. Pt is unable to achieve shoulder flexion beyond 90*, grip strength 3/5, FMC significantly decreased/slowed. Pt states this is worse than his baseline level of weakness. LUE Coordination: decreased gross motor;decreased fine motor   Lower Extremity Assessment Lower Extremity Assessment: Defer to PT evaluation;Generalized weakness       Communication Communication Communication: No difficulties   Cognition Arousal/Alertness: Awake/alert Behavior During Therapy: WFL for tasks assessed/performed;Flat affect Overall Cognitive Status: Within Functional Limits for tasks assessed                                 General Comments: Pt engaged, and agreeable t/o evaluation. On multiple occasions makes comments such as "I just wish I would die", "I'm completely useless", or "God gave up on me years ago". Pt primary RN notified pt may benefit from psychiatry/chaplain consult. This Pryor Curia utilizes active listening and therapeutic use of self throughout the session to provide pt with emotional support.   General Comments       Exercises Other Exercises Other Exercises: Pt educated on role of OT in acute setting, falls prevention strategies, energy conservation strategies, and limited BUE therapeutic exercises with emphasis on LUE hand exercises this date. Pt has limited recall of education provided and states "Honey, I don't do nothing in this bed all day" With mild encouragement pt able to return demonstrate BUE ther-ex including bicep curls, shoulder raises, composit digit flexion/extension, and wrist flexion/extension.   Shoulder Instructions      Home Living Family/patient expects to be discharged to:: Private residence Living Arrangements: Alone Available Help at Discharge: Friend(s);Neighbor;Available PRN/intermittently Type of Home: Apartment Home Access: Level entry      Home Layout: One level     Bathroom Shower/Tub: Tub/shower unit;Curtain   Biochemist, clinical: Standard     Home Equipment: Walker - 4 wheels;Grab bars - toilet;Grab bars - tub/shower;Hand held shower head          Prior Functioning/Environment Level of Independence: Independent with assistive device(s)        Comments: Pt uses 4WW for all mobility. States "I can't take a step without my walker". Friends assist with shopping and bring him food. States he rarely leaves the house.        OT Problem List: Decreased strength;Decreased coordination;Decreased activity tolerance;Decreased safety awareness;Impaired balance (sitting and/or standing);Decreased knowledge of use of DME or AE;Decreased knowledge of precautions;Impaired UE functional use;Decreased range of motion      OT Treatment/Interventions: Self-care/ADL training;Therapeutic exercise;Therapeutic activities;DME and/or AE instruction;Patient/family education;Balance training    OT Goals(Current goals can be found in the care plan section) Acute Rehab OT Goals Patient Stated Goal: "to be useful again" OT Goal Formulation: With patient Time For Goal Achievement: 02/03/20 Potential to Achieve Goals: Good ADL Goals Pt Will Perform Grooming:  sitting;with modified independence Pt Will Perform Upper Body Dressing: sitting;with set-up;with supervision Pt Will Perform Lower Body Dressing: sit to/from stand;sitting/lateral leans;with min assist;with adaptive equipment(With LRAD PRN for improved safety and functional independence.) Pt Will Transfer to Toilet: with set-up;with supervision;bedside commode;stand pivot transfer(With LRAD PRN for improved safety and functional independence.)  OT Frequency: Min 2X/week   Barriers to D/C: Decreased caregiver support;Inaccessible home environment          Co-evaluation              AM-PAC OT "6 Clicks" Daily Activity     Outcome Measure Help from another person eating  meals?: A Little Help from another person taking care of personal grooming?: A Little Help from another person toileting, which includes using toliet, bedpan, or urinal?: A Lot Help from another person bathing (including washing, rinsing, drying)?: A Lot Help from another person to put on and taking off regular upper body clothing?: A Little Help from another person to put on and taking off regular lower body clothing?: A Lot 6 Click Score: 15   End of Session    Activity Tolerance: Patient tolerated treatment well Patient left: in bed;with call bell/phone within reach;with bed alarm set;Other (comment)(With bilat prevlon boots in place at start/end of session.)  OT Visit Diagnosis: Other abnormalities of gait and mobility (R26.89);Muscle weakness (generalized) (M62.81)                Time: 1321-1350 OT Time Calculation (min): 29 min Charges:  OT General Charges $OT Visit: 1 Visit OT Evaluation $OT Eval Moderate Complexity: 1 Mod OT Treatments $Self Care/Home Management : 8-22 mins  Shara Blazing, M.S., OTR/L Ascom: [phone] 01/20/20, 2:29 PM

## 2020-01-20 NOTE — Progress Notes (Signed)
PROGRESS NOTE  Alexander Alvarado  [id number] DOB: 12-13-1940 DOA: 01/18/2020 PCP: Glean Hess, MD   Brief Narrative: Alexander Sallade Hunteris a 79 y.o.malewith medical history significant ofhypertension, peripheral vascular diseases/pamputation of second toe and CVA  Brought to ED with concern of left-sided weakness which started this morning.Per patient he has some residual weakness on left side but when he got up this morning he was unable to move his left side.He normally walks with the help of walker since his prior stroke,he could not grasp a cup or walk .Hissymptoms started gradually improving as the day passes. He denies any facial droop, denies any change in his vision. He has left-sided drooping eyelid for a long time.  He was also complaining of worsening pain in his left foot. He has chronic wounds in his left third toe and heel. Apparently seen by in wound care nurse at home and getting treatment with no relief. He denies using any antibiotics or seen a physician for that. He has his second toe amputated due to osteo in the past. He denies any fever or chills. No upper respiratory symptoms. He lives alone and denies any sick contacts. He was also complaining of some increased urinary urgency and frequency. Denies any dysuria, hematuria or lower abdominal pain.  Assessment & Plan: Active Problems:   PAD (peripheral artery disease) (HCC)   Osteomyelitis of left foot (HCC)   Malnutrition of moderate degree  Left-sided weakness: Stable, more sensory component than motor on my exam. CT head initially without abnormality. LDL 42. - Pt declines MRI/MRA due to inability to lay flat due to sinus issues. Can consider repeat CT head in the future.  - Continue PT, OT  Left 3rd toe osteomyelitis and left heel ulceration complicated by PAD: No clinical evidence of venous insufficiency. CRP 3.3 - Vascular surgery consulted, planning angiogram. Pt has hx revascularization  in the past. ABI consistent with L > R moderate PAD.  - Podiatry planning left heel debridement and left 3rd toe amputations with possible taking metatarsal head.  - Continue broad spectrum antibiotics. Would benefit from intraoperative cultures more than superficial cultures currently pending.  - Keep on SSI for tight glucose control. HbA1c consistent with prediabetes at 5.8%.  Tobacco use: Long term.  - Cessation counseling provided, drawing line between his use and current vascular issues.   HTN:  - Restart home antihypertensives.   Urinary urgency, frequency:  - Negative UA.  - Bladder scan prn.  RN Pressure Injury Documentation: Pressure Injury 01/20/20 Toe (Comment  which one) Anterior;Left Black dried wound (Active)  01/20/20 0830  Location: Toe (Comment  which one)  Location Orientation: Anterior;Left  Staging:   Wound Description (Comments): Black dried wound  Present on Admission:      Pressure Injury 01/20/20 Heel Left (Active)  01/20/20 0830  Location: Heel  Location Orientation: Left  Staging:   Wound Description (Comments):   Present on Admission:    Moderate protein calorie malnutrition. Supplement protein as able to optimize wound healing.   DVT prophylaxis: Lovenox Code Status: DNR Family Communication: None at bedside Disposition Plan: Patient from home, will require SNF at discharge post operatively.   Consultants:   Podiatry, Dr. Luana Shu  Vascular surgery  Procedures:   None  Antimicrobials:  Vancomycin, ceftriaxone, flagyl  Gram stain: Staphylococcus aureus, susceptibilities pending. Unclear if this is causative organism or skin flora.  Subjective: Pain controlled better, but still burning, moderate, constant.   Objective: Vitals:   01/19/20 1610 01/19/20  2346 01/20/20 0755 01/20/20 1547  BP:  139/74 (!) 151/79 (!) 148/82  Pulse:  74 76 87  Resp:  18 17 17   Temp:  97.8 F (36.6 C) 98.5 F (36.9 C) 98.3 F (36.8 C)  TempSrc:  Oral  Oral Oral  SpO2:  93% 96% 92%  Weight: 73.8 kg     Height:        Intake/Output Summary (Last 24 hours) at 01/20/2020 1731 Last data filed at 01/20/2020 0358 Gross per 24 hour  Intake 360 ml  Output 1400 ml  Net -1040 ml   Filed Weights   01/18/20 1244 01/19/20 1610  Weight: 68 kg 73.8 kg    Gen: 79 y.o. male in no distress  Pulm: Non-labored breathing room air. Clear to auscultation bilaterally.  CV: Regular rate and rhythm. No murmur, rub, or gallop. No JVD, no pedal edema. GI: Abdomen soft, non-tender, non-distended, with normoactive bowel sounds. No organomegaly or masses felt. Ext: Warm, no deformities. Left foot freshly wrapped with dressing c/d/i. Skin: Besides left foot, no other ulcers noted on visualized skin. Neuro: Alert and oriented. No focal neurological deficits. Psych: Judgement and insight appear normal. Mood & affect appropriate.   Data Reviewed: I have personally reviewed following labs and imaging studies  CBC: Recent Labs  Lab 01/18/20 1246 01/19/20 0513  WBC 14.1* 11.1*  NEUTROABS 10.7*  --   HGB 18.2* 16.4  HCT 54.7* 49.6  MCV 92.4 93.6  PLT 279 [id number]   Basic Metabolic Panel: Recent Labs  Lab 01/18/20 1246 01/19/20 0513  NA 136 138  K 3.5 3.9  CL 98 101  CO2 27 27  GLUCOSE 174* 90  BUN 11 12  CREATININE 0.61 0.69  CALCIUM 9.2 8.9   GFR: Estimated Creatinine Clearance: 81.1 mL/min (by C-G formula based on SCr of 0.69 mg/dL). Liver Function Tests: Recent Labs  Lab 01/18/20 1246  AST 16  ALT 14  ALKPHOS 133*  BILITOT 0.7  PROT 7.7  ALBUMIN 4.0   No results for input(s): LIPASE, AMYLASE in the last 168 hours. No results for input(s): AMMONIA in the last 168 hours. Coagulation Profile: No results for input(s): INR, PROTIME in the last 168 hours. Cardiac Enzymes: No results for input(s): CKTOTAL, CKMB, CKMBINDEX, TROPONINI in the last 168 hours. BNP (last 3 results) No results for input(s): PROBNP in the last 8760  hours. HbA1C: Recent Labs    01/19/20 0513  HGBA1C 5.8*   CBG: No results for input(s): GLUCAP in the last 168 hours. Lipid Profile: Recent Labs    01/19/20 0513  CHOL 105  HDL 28*  LDLCALC 42  TRIG 175*  CHOLHDL 3.8   Thyroid Function Tests: No results for input(s): TSH, T4TOTAL, FREET4, T3FREE, THYROIDAB in the last 72 hours. Anemia Panel: No results for input(s): VITAMINB12, FOLATE, FERRITIN, TIBC, IRON, RETICCTPCT in the last 72 hours. Urine analysis:    Component Value Date/Time   COLORURINE YELLOW (A) 01/19/2020 1516   APPEARANCEUR CLEAR (A) 01/19/2020 1516   APPEARANCEUR Hazy 07/14/2012 2232   LABSPEC 1.010 01/19/2020 1516   LABSPEC 1.024 07/14/2012 2232   PHURINE 7.0 01/19/2020 1516   GLUCOSEU NEGATIVE 01/19/2020 1516   GLUCOSEU Negative 07/14/2012 2232   HGBUR NEGATIVE 01/19/2020 1516   BILIRUBINUR NEGATIVE 01/19/2020 1516   BILIRUBINUR neg 02/10/2019 0847   BILIRUBINUR Negative 07/14/2012 2232   KETONESUR NEGATIVE 01/19/2020 1516   PROTEINUR NEGATIVE 01/19/2020 1516   UROBILINOGEN 0.2 02/10/2019 0847   NITRITE NEGATIVE 01/19/2020 1516  LEUKOCYTESUR SMALL (A) 01/19/2020 1516   LEUKOCYTESUR Negative 07/14/2012 2232   Recent Results (from the past 240 hour(s))  SARS CORONAVIRUS 2 (TAT 6-24 HRS) Nasopharyngeal Nasopharyngeal Swab     Status: None   Collection Time: 01/18/20  4:26 PM   Specimen: Nasopharyngeal Swab  Result Value Ref Range Status   SARS Coronavirus 2 NEGATIVE NEGATIVE Final    Comment: (NOTE) SARS-CoV-2 target nucleic acids are NOT DETECTED. The SARS-CoV-2 RNA is generally detectable in upper and lower respiratory specimens during the acute phase of infection. Negative results do not preclude SARS-CoV-2 infection, do not rule out co-infections with other pathogens, and should not be used as the sole basis for treatment or other patient management decisions. Negative results must be combined with clinical observations, patient history,  and epidemiological information. The expected result is Negative. Fact Sheet for Patients: SugarRoll.be Fact Sheet for Healthcare Providers: https://www.woods-mathews.com/ This test is not yet approved or cleared by the [place] FDA and  has been authorized for detection and/or diagnosis of SARS-CoV-2 by FDA under an Emergency Use Authorization (EUA). This EUA will remain  in effect (meaning this test can be used) for the duration of the COVID-19 declaration under Section 56 4(b)(1) of the Act, 21 U.S.C. section 360bbb-3(b)(1), unless the authorization is terminated or revoked sooner. Performed at [facility], [place] [address] [place], [place] [address]   Wound or Superficial Culture     Status: None (Preliminary result)   Collection Time: 01/18/20  4:53 PM   Specimen: Wound  Result Value Ref Range Status   Specimen Description   Final    WOUND Performed at [facility], [address] [place], [place] [address]    Special Requests   Final    LEFT 3RD TOE Performed at [facility], [address] [place], [place] [address]    Gram Stain   Final    NO WBC SEEN MODERATE GRAM POSITIVE COCCI MODERATE GRAM POSITIVE RODS RARE GRAM NEGATIVE RODS    Culture   Final    FEW STAPHYLOCOCCUS AUREUS SUSCEPTIBILITIES TO FOLLOW Performed at [facility], [place] [address], [place], [place] [address]    Report Status PENDING  Incomplete  Wound or Superficial Culture     Status: None (Preliminary result)   Collection Time: 01/18/20  4:53 PM   Specimen: Wound  Result Value Ref Range Status   Specimen Description   Final    WOUND Performed at [facility], [address], [place], [place] [address]    Special Requests   Final    LEFT HEEL Performed at [facility], [address] [place], [place] [address]    Gram Stain NO WBC SEEN RARE GRAM POSITIVE COCCI   Final   Culture   Final     FEW STAPHYLOCOCCUS AUREUS SUSCEPTIBILITIES TO FOLLOW Performed at [facility], [place] [address], [place],  [address]    Report Status PENDING  Incomplete      Radiology Studies: US ARTERIAL ABI (SCREENING LOWER EXTREMITY)  Result Date: 01/19/2020 CLINICAL DATA:  79 year old male with peripheral arterial disease and bilateral surgical bypasses. EXAM: NONINVASIVE PHYSIOLOGIC VASCULAR STUDY OF BILATERAL LOWER EXTREMITIES TECHNIQUE: Evaluation of both lower extremities were performed at rest, including calculation of ankle-brachial indices with single level Doppler, pressure and pulse volume recording. COMPARISON:  None. FINDINGS: Right ABI:  0.61 Left ABI:  0.53 Right Lower Extremity: Abnormal biphasic arterial waveforms at the ankle. Left Lower  Extremity: Abnormal monophasic arterial waveforms at the ankle. 0.5-0.79 Moderate PAD IMPRESSION: Abnormal bilateral resting ankle-brachial indices consistent with at least moderate underlying peripheral arterial disease. The abnormalities are slightly worse on the left than the right. Signed, Criselda Peaches, MD, [facility] Radiology Electronically Signed   By: Jacqulynn Cadet M.D.   On: 01/19/2020 14:10   ECHOCARDIOGRAM COMPLETE  Result Date: 01/19/2020    ECHOCARDIOGRAM REPORT   Patient Name:   Alexander Alvarado Date of Exam: 01/19/2020 Medical Rec #:  [id number]       Height:       70.0 in Accession #:    [id number]      Weight:       150.0 lb Date of Birth:  [birth year]      BSA:          1.847 m Patient Age:    44 years        BP:           135/66 mmHg Patient Gender: M               HR:           72 bpm. Exam Location:  [facility] Procedure: 2D Echo, Color Doppler and Cardiac Doppler Indications:     I163.9 Stroke  History:         Patient has no prior history of Echocardiogram examinations.                  PVD, Signs/Symptoms:Left sided weakness; Risk                  Factors:Hypertension and  HCL.  Sonographer:     Charmayne Sheer RDCS (AE) Referring Phys:  [id number] [facility] AMIN Diagnosing Phys: Harrell Gave End MD  Sonographer Comments: Suboptimal parasternal window and suboptimal subcostal window. IMPRESSIONS  1. Left ventricular ejection fraction, by estimation, is 45%. The left ventricle has mild to moderately decreased function. The left ventricle demonstrates global hypokinesis. Left ventricular diastolic parameters are consistent with Grade I diastolic dysfunction (impaired relaxation).  2. Right ventricular systolic function is normal. The right ventricular size is normal. Tricuspid regurgitation signal is inadequate for assessing PA pressure.  3. The mitral valve is degenerative. No evidence of mitral valve regurgitation. No evidence of mitral stenosis.  4. The aortic valve was not well visualized. Aortic valve regurgitation is not visualized. Mild aortic valve sclerosis is present, with no evidence of aortic valve stenosis. FINDINGS  Left Ventricle: Left ventricular ejection fraction, by estimation, is 45%. The left ventricle has mild to moderately decreased function. The left ventricle demonstrates global hypokinesis. The left ventricular internal cavity size was normal in size. There is no left ventricular hypertrophy. Abnormal (paradoxical) septal motion, consistent with left bundle branch block. Left ventricular diastolic parameters are consistent with Grade I diastolic dysfunction (impaired relaxation). Right Ventricle: The right ventricular size is normal. Right vetricular wall thickness was not assessed. Right ventricular systolic function is normal. Tricuspid regurgitation signal is inadequate for assessing PA pressure. Left Atrium: Left atrial size was normal in size. Right Atrium: Right atrial size was normal in size. Pericardium: The pericardium was not well visualized. Mitral Valve: The mitral valve is degenerative in appearance. There is mild thickening of the mitral valve leaflet(s).  Moderate mitral annular calcification. No evidence of mitral valve regurgitation. No evidence of mitral valve stenosis. MV peak gradient, 4.2 mmHg. The mean mitral valve gradient is 2.0 mmHg. Tricuspid Valve: The  tricuspid valve is not well visualized. Tricuspid valve regurgitation is not demonstrated. Aortic Valve: The aortic valve was not well visualized. Aortic valve regurgitation is not visualized. Mild aortic valve sclerosis is present, with no evidence of aortic valve stenosis. Mild to moderate aortic valve annular calcification. Aortic valve mean gradient measures 3.0 mmHg. Aortic valve peak gradient measures 6.1 mmHg. Aortic valve area, by VTI measures 1.56 cm. Pulmonic Valve: The pulmonic valve was not well visualized. Pulmonic valve regurgitation is not visualized. No evidence of pulmonic stenosis. Aorta: The aortic root is normal in size and structure. Pulmonary Artery: The pulmonary artery is of normal size. Venous: The inferior vena cava was not well visualized. IAS/Shunts: The interatrial septum was not well visualized.  LEFT VENTRICLE PLAX 2D LVIDd:         4.71 cm      Diastology LVIDs:         3.65 cm      LV e' lateral:   5.44 cm/s LV PW:         0.94 cm      LV E/e' lateral: 11.0 LV IVS:        0.81 cm      LV e' medial:    5.00 cm/s LVOT diam:     2.10 cm      LV E/e' medial:  11.9 LV SV:         32 LV SV Index:   17 LVOT Area:     3.46 cm  LV Volumes (MOD) LV vol d, MOD A4C: 107.0 ml LV vol s, MOD A4C: 49.2 ml LV SV MOD A4C:     107.0 ml LEFT ATRIUM           Index       RIGHT ATRIUM           Index LA diam:      3.90 cm 2.11 cm/m  RA Area:     14.14 cm 7.66 cm/m LA Vol (A4C): 33.2 ml 17.97 ml/m  AORTIC VALVE                   PULMONIC VALVE AV Area (Vmax):    1.54 cm    PV Vmax:       0.88 m/s AV Area (Vmean):   1.60 cm    PV Vmean:      60.300 cm/s AV Area (VTI):     1.56 cm    PV VTI:        0.154 m AV Vmax:           123.00 cm/s PV Peak grad:  3.1 mmHg AV Vmean:          81.200  cm/s PV Mean grad:  2.0 mmHg AV VTI:            0.203 m AV Peak Grad:      6.1 mmHg AV Mean Grad:      3.0 mmHg LVOT Vmax:         54.70 cm/s LVOT Vmean:        37.400 cm/s LVOT VTI:          0.092 m LVOT/AV VTI ratio: 0.45  AORTA Ao Root diam: 3.20 cm MITRAL VALVE MV Area (PHT): 2.91 cm    SHUNTS MV Peak grad:  4.2 mmHg    Systemic VTI:  0.09 m MV Mean grad:  2.0 mmHg    Systemic Diam: 2.10 cm MV Vmax:  1.03 m/s MV Vmean:      60.0 cm/s MV Decel Time: 261 msec MV E velocity: 59.70 cm/s MV A velocity: 78.10 cm/s MV E/A ratio:  0.76 Christopher End MD Electronically signed by Nelva Bush MD Signature Date/Time: 01/19/2020/10:29:16 AM    Final     Scheduled Meds:  amLODipine  10 mg Oral Daily   aspirin EC  81 mg Oral Daily   atorvastatin  20 mg Oral QHS   enoxaparin (LOVENOX) injection  40 mg Subcutaneous Q24H   feeding supplement (ENSURE ENLIVE)  237 mL Oral TID BM   folic acid  1 mg Oral Daily   melatonin  5 mg Oral QHS   metroNIDAZOLE  500 mg Oral Q8H   multivitamin-lutein  1 capsule Oral Daily   pantoprazole  40 mg Oral Daily   sodium chloride flush  3 mL Intravenous Q12H   vitamin B-12  1,000 mcg Oral Daily   Continuous Infusions:  cefTRIAXone (ROCEPHIN)  IV 2 g (01/19/20 2255)   vancomycin 1,750 mg (01/20/20 0307)     LOS: 2 days   Time spent: 25 minutes.  Patrecia Pour, MD [facility] [email or web address] 01/20/2020, 5:31 PM

## 2020-01-20 NOTE — Evaluation (Signed)
Physical Therapy Evaluation Patient Details Name: Alexander Alvarado MRN: [id number] DOB: [birth year] Today's Date: 01/20/2020   History of Present Illness  Alexander Alvarado is a 79 y.o. male who presents with multiple medical issues.  Patient arrived via EMS due to left-sided weakness and concern for potential CVA.  Patient was also noted to have a ulceration to the dorsal aspect of the left third toe with bone exposed as well as posterior aspect left heel.  Patient notes symptoms have been present for for about 2 months.  States they have progressively worsened over the last 2 months, becoming larger in the size and foul-smelling. Pt being followed by vascular surgery with plan for potential surgical intervention later in the week.  PMH includes CVA with residual L sided weakness, PVD, HTN, HLD, R hip hemiarthroplastly.    Clinical Impression  Pt pleasant and motivated to participate during the session.  Pt found on room air with [place] removed and lying next to pt in bed.  SpO2 checked while on room air at 86%, nursing notified.  Pt returned to 2LO2/min  with SpO2 increasing quickly to the low 90s.  Pt put forth good effort throughout the session with SpO2 remaining in the low to mid 90s with activity on 2LO2/min.  Pt did not require physical assist to get in/out of bed or to stand from an elevated EOB but once in standing pt required frequent min A to prevent posterior LOB.  Pt's L foot placed on top of writer's foot to ensure NWB compliance while in standing.  Pt reported only on fall in the last 6 months that led to a R hip fracture but reported feeling unsteady frequently with many near miss falls over the last year.  Pt will benefit from PT services in a SNF setting upon discharge to safely address deficits listed in patient problem list for decreased caregiver assistance and eventual return to PLOF.      Follow Up Recommendations SNF    Equipment Recommendations  Rolling walker with 5" wheels     Recommendations for Other Services       Precautions / Restrictions Precautions Precautions: Fall Precaution Comments: High Fall Restrictions Weight Bearing Restrictions: Yes LLE Weight Bearing: Non weight bearing      Mobility  Bed Mobility Overal bed mobility: Needs Assistance Bed Mobility: Supine to Sit;Sit to Supine Rolling: Min assist   Supine to sit: Supervision Sit to supine: Supervision   General bed mobility comments: Increased time and effort with bed mobility tasks but no physical assistance required.  Transfers Overall transfer level: Needs assistance Equipment used: Rolling walker (2 wheeled) Transfers: Sit to/from Stand Sit to Stand: From elevated surface;Min assist         General transfer comment: Mod verbal cues for sequencing with pt's L foot on top of this PT's foot to ensure WB compliance. Min A provided for stability once in standing to prevent posterior LOB.  Ambulation/Gait             General Gait Details: Unable  Stairs            Wheelchair Mobility    Modified Rankin (Stroke Patients Only)       Balance Overall balance assessment: Needs assistance Sitting-balance support: No upper extremity supported Sitting balance-[name]: Good     Standing balance support: Bilateral upper extremity supported Standing balance-[name]: Poor Standing balance comment: Min A to prevent posterior LOB in standing  Pertinent Vitals/Pain Pain Assessment: 0-10 Pain Score: 8  Faces Pain Scale: Hurts a little bit Pain Location: L foot Pain Descriptors / Indicators: Burning Pain Intervention(s): Premedicated before session;Monitored during session    [place] expects to be discharged to:: Private residence Living Arrangements: Alone Available Help at Discharge: Friend(s);Neighbor;Available PRN/intermittently Type of Home: Apartment Home Access: Level entry     Home  Layout: One level Home Equipment: Walker - 4 wheels;Grab bars - toilet;Grab bars - tub/shower;Hand held shower head;Wheelchair - manual      Prior Function Level of Independence: Independent with assistive device(s)         Comments: Mod Ind amb with rollator HH distances.  Pt reported "I can't take a step without my walker". Friends assist with shopping and bring him food. States he rarely leaves the house.     Hand Dominance   Dominant Hand: Right    Extremity/Trunk Assessment   Upper Extremity Assessment Upper Extremity Assessment: Defer to OT evaluation RUE Deficits / Details: WFLs LUE Deficits / Details: Pt LUE notably weaker than RUE this date. Pt is unable to achieve shoulder flexion beyond 90*, grip strength 3/5, FMC significantly decreased/slowed. Pt states this is worse than his baseline level of weakness. LUE Coordination: decreased gross motor;decreased fine motor    Lower Extremity Assessment Lower Extremity Assessment: Generalized weakness       Communication   Communication: No difficulties  Cognition Arousal/Alertness: Awake/alert Behavior During Therapy: WFL for tasks assessed/performed;Flat affect Overall Cognitive Status: Within Functional Limits for tasks assessed                                 General Comments: Pt engaged, and agreeable t/o evaluation. On multiple occasions makes comments such as "I just wish I would die", "I'm completely useless", or "God gave up on me years ago". Pt primary RN notified pt may benefit from psychiatry/chaplain consult. This Pryor Curia utilizes active listening and therapeutic use of self throughout the session to provide pt with emotional support.      General Comments      Exercises Total Joint Exercises Ankle Circles/Pumps: AROM;Strengthening;Both;10 reps Quad Sets: Strengthening;Both;10 reps Gluteal Sets: Strengthening;Both;10 reps Hip ABduction/ADduction: AROM;Strengthening;Both;10 reps Straight  Leg Raises: AROM;Strengthening;Both;10 reps Long Arc Quad: Strengthening;Both;10 reps;15 reps Knee Flexion: Strengthening;Both;10 reps;15 reps Marching in Standing: AROM;Left;5 reps;Standing Other Exercises Other Exercises: Pt educated on role of OT in acute setting, falls prevention strategies, energy conservation strategies, and limited BUE therapeutic exercises with emphasis on LUE hand exercises this date. Pt has limited recall of education provided and states "Honey, I don't do nothing in this bed all day" With mild encouragement pt able to return demonstrate BUE ther-ex including bicep curls, shoulder raises, composit digit flexion/extension, and wrist flexion/extension.   Assessment/Plan    PT Assessment Patient needs continued PT services  PT Problem List Decreased strength;Decreased activity tolerance;Decreased balance;Decreased mobility;Decreased knowledge of use of DME;Decreased safety awareness;Decreased knowledge of precautions;Pain       PT Treatment Interventions DME instruction;Gait training;Functional mobility training;Therapeutic activities;Therapeutic exercise;Balance training;Patient/family education    PT Goals (Current goals can be found in the Care Plan section)  Acute Rehab PT Goals Patient Stated Goal: Improved balance with walking PT Goal Formulation: With patient Time For Goal Achievement: 02/02/20 Potential to Achieve Goals: Fair    Frequency Min 2X/week   Barriers to discharge Inaccessible home environment;Decreased caregiver support      Co-evaluation  AM-PAC PT "6 Clicks" Mobility  Outcome Measure Help needed turning from your back to your side while in a flat bed without using bedrails?: A Little Help needed moving from lying on your back to sitting on the side of a flat bed without using bedrails?: A Little Help needed moving to and from a bed to a chair (including a wheelchair)?: A Little Help needed standing up from a chair  using your arms (e.g., wheelchair or bedside chair)?: A Little Help needed to walk in hospital room?: Total Help needed climbing 3-5 steps with a railing? : Total 6 Click Score: 14    End of Session Equipment Utilized During Treatment: Gait belt;Oxygen Activity Tolerance: Patient tolerated treatment well Patient left: in bed;with call bell/phone within reach;with bed alarm set;Other (comment)(Prevalon boots donned to B feet) Nurse Communication: Mobility status PT Visit Diagnosis: Unsteadiness on feet (R26.81);History of falling (Z91.81);Difficulty in walking, not elsewhere classified (R26.2);Muscle weakness (generalized) (M62.81);Pain Pain - Right/Left: Left Pain - part of body: Ankle and joints of foot    Time: 1456-1540 PT Time Calculation (min) (ACUTE ONLY): 44 min   Charges:   PT Evaluation $PT Eval Moderate Complexity: 1 Mod PT Treatments $Therapeutic Exercise: 8-22 mins        D. Royetta Asal PT, DPT 01/20/20, 4:03 PM

## 2020-01-21 ENCOUNTER — Other Ambulatory Visit (INDEPENDENT_AMBULATORY_CARE_PROVIDER_SITE_OTHER): Payer: Self-pay | Admitting: Vascular Surgery

## 2020-01-21 LAB — AEROBIC CULTURE W GRAM STAIN (SUPERFICIAL SPECIMEN)
Gram Stain: NONE SEEN
Gram Stain: NONE SEEN

## 2020-01-21 LAB — GLUCOSE, CAPILLARY
Glucose-Capillary: 127 mg/dL — ABNORMAL HIGH (ref 70–99)
Glucose-Capillary: 161 mg/dL — ABNORMAL HIGH (ref 70–99)
Glucose-Capillary: 136 mg/dL — ABNORMAL HIGH (ref 70–99)
Glucose-Capillary: 147 mg/dL — ABNORMAL HIGH (ref 70–99)

## 2020-01-21 LAB — CREATININE, SERUM
Creatinine, Ser: 0.66 mg/dL (ref 0.61–1.24)
GFR calc Af Amer: >60 mL/min (ref >60)
GFR calc non Af Amer: >60 mL/min (ref >60)

## 2020-01-21 MED ORDER — QUETIAPINE FUMARATE 25 MG PO TABS
25.0000 mg | ORAL_TABLET | Freq: Every day | ORAL | Status: DC
  Administered 2020-01-21: 25 mg via ORAL
  Filled 2020-01-21: qty 1

## 2020-01-21 MED ORDER — TRAZODONE HCL 100 MG PO TABS
100.0000 mg | ORAL_TABLET | Freq: Every day | ORAL | Status: DC
  Administered 2020-01-21 – 2020-01-31 (×11): 100 mg via ORAL
  Filled 2020-01-21 (×11): qty 1

## 2020-01-21 MED ORDER — GABAPENTIN 100 MG PO CAPS
100.0000 mg | ORAL_CAPSULE | Freq: Three times a day (TID) | ORAL | Status: DC
  Administered 2020-01-21 – 2020-01-29 (×21): 100 mg via ORAL
  Filled 2020-01-21 (×21): qty 1

## 2020-01-21 MED ORDER — QUETIAPINE FUMARATE 25 MG PO TABS: 50.0000 mg | ORAL_TABLET | Freq: Once | ORAL | Status: DC

## 2020-01-21 MED ORDER — SODIUM CHLORIDE 0.9 % IV SOLN: INTRAVENOUS | Status: DC

## 2020-01-21 MED ORDER — TRAMADOL HCL 50 MG PO TABS
50.0000 mg | ORAL_TABLET | Freq: Two times a day (BID) | ORAL | Status: DC | PRN
  Administered 2020-01-21 – 2020-01-25 (×5): 50 mg via ORAL
  Filled 2020-01-21 (×5): qty 1

## 2020-01-21 MED ORDER — ENOXAPARIN SODIUM 40 MG/0.4ML ~~LOC~~ SOLN
40.0000 mg | SUBCUTANEOUS | Status: AC
  Administered 2020-01-21: 22:00:00 40 mg via SUBCUTANEOUS
  Filled 2020-01-21: qty 0.4

## 2020-01-21 MED ORDER — TRAZODONE HCL 50 MG PO TABS
50.0000 mg | ORAL_TABLET | Freq: Every evening | ORAL | Status: DC | PRN
  Administered 2020-01-21 – 2020-01-31 (×6): 50 mg via ORAL
  Filled 2020-01-21 (×7): qty 1

## 2020-01-21 NOTE — Progress Notes (Signed)
[facility] Daily Progress Note   Subjective: Patient without new complaint.   Objective: Vitals:   01/20/20 0755 01/20/20 1547 01/20/20 2326 01/21/20 0755  BP: (!) 151/79 (!) 148/82 (!) 143/76 138/78  Pulse: 76 87 72 69  Resp: 17 17 18 16   Temp: 98.5 F (36.9 C) 98.3 F (36.8 C) 98.5 F (36.9 C) 98.9 F (37.2 C)  TempSrc: Oral Oral Oral   SpO2: 96% 92% 91% 94%  Weight:      Height:        Intake/Output Summary (Last 24 hours) at 01/21/2020 0947 Last data filed at 01/21/2020 0400 Gross per 24 hour  Intake 1006.74 ml  Output --  Net 1006.74 ml   Physical Exam: A&Ox3, NAD CV: RRR Pulmonary: CTA Bilaterally Abdomen: Soft, Nontender, Nondistended Vascular:  Left Lower Extremity: Thigh soft.  Calf soft.  Wound stable.  Unable to palpate pedal pulses.   Laboratory: CBC    Component Value Date/Time   WBC 11.1 (H) 01/19/2020 0513   HGB 16.4 01/19/2020 0513   HGB 20.6 (HH) 02/10/2019 0852   HCT 49.6 01/19/2020 0513   HCT 59.9 (H) 02/10/2019 0852   PLT 253 01/19/2020 0513   PLT 189 02/10/2019 0852   BMET    Component Value Date/Time   NA 138 01/19/2020 0513   NA 138 02/10/2019 0852   NA 138 07/15/2012 0447   K 3.9 01/19/2020 0513   K 3.3 (L) 07/15/2012 0447   CL 101 01/19/2020 0513   CL 104 07/15/2012 0447   CO2 27 01/19/2020 0513   CO2 23 07/15/2012 0447   GLUCOSE 90 01/19/2020 0513   GLUCOSE 79 07/15/2012 0447   BUN 12 01/19/2020 0513   BUN 9 02/10/2019 0852   BUN 18 07/15/2012 0447   CREATININE 0.66 01/21/2020 0500   CREATININE 0.79 07/15/2012 0447   CALCIUM 8.9 01/19/2020 0513   CALCIUM 8.7 07/15/2012 0447   GFRNONAA >60 01/21/2020 0500   GFRNONAA >60 07/15/2012 0447   GFRAA >60 01/21/2020 0500   GFRAA >60 07/15/2012 0447   Assessment/Planning: The patient is a 79 year old male who presented to the [facility] emergency department with worsening of his chronic wounds to left foot.  1) plan for left lower  extremity angiogram with possible intervention tomorrow.  Procedure, risks and benefits explained to the patient.  All questions answered.  Patient wishes to proceed.  2) Pre-op for tomorrow  Discussed with Dr. Eber Hong [facility] PA-C 01/21/2020 9:47 AM

## 2020-01-21 NOTE — Progress Notes (Signed)
Patient refused insulin at 0800, 1200. States that " I don't take insulin". Dressing changed to left heel. No c/o pain . Still has not been able to sleep , new order for Trazadone 100 mg PO at HS . Prevalon boots on at this time. Consent for Angiogram filled out and placed in medical record

## 2020-01-21 NOTE — NC FL2 (Signed)
[place] LEVEL OF CARE SCREENING TOOL     IDENTIFICATION  Patient Name: Alexander Alvarado Birthdate: October 24, 1940 Sex: male Admission Date (Current Location): 01/18/2020  [place] Number:  Engineering geologist and Address:  [facility], [address], [place], [place] [address]      Provider Number:    Attending Physician Name and Address:  Patrecia Pour, MD  Relative Name and Phone Number:       Current Level of Care: SNF Recommended Level of Care: [place] Prior Approval Number:    Date Approved/Denied:   PASRR Number: [id number]  Discharge Plan: SNF    Current Diagnoses: Patient Active Problem List   Diagnosis Date Noted  . Malnutrition of moderate degree 01/19/2020  . Osteomyelitis of left foot ([place]) 01/18/2020  . Left-sided weakness   . Closed hip fracture requiring operative repair, right, sequela   . Acute on chronic respiratory failure with hypoxia ([place])   . Aspiration pneumonia of right lower lobe due to gastric secretions ([place])   . Tachycardia   . Femur fracture, right ([place]) 09/10/2019  . Amputation of toe of left foot ([place]) 02/10/2019  . Pre-diabetes 01/07/2017  . B12 nutritional deficiency 12/07/2016  . Folic acid deficiency [id number]  . Cerebrovascular accident (CVA) due to occlusion of cerebral artery ([place]) 12/30/2015  . Senile ecchymosis 12/30/2015  . Essential hypertension 03/12/2015  . Gastro-esophageal reflux disease without esophagitis 03/12/2015  . Hyperlipidemia, mixed 03/12/2015  . Insomnia, persistent 03/12/2015  . Neoplasm of uncertain behavior of skin of hand 03/12/2015  . Arthritis, degenerative 03/12/2015  . PAD (peripheral artery disease) ([place]) 03/12/2015  . Compulsive tobacco user syndrome 03/12/2015  . H/O alcohol abuse 10/29/2012    Orientation RESPIRATION BLADDER Height & Weight     Self, Time, Situation, Place  Normal Continent, External catheter Weight:  73.8 kg(bed scale) Height:  5\' 10"  (177.8 cm)  BEHAVIORAL SYMPTOMS/MOOD NEUROLOGICAL BOWEL NUTRITION STATUS      Continent Diet(Regular)  AMBULATORY STATUS COMMUNICATION OF NEEDS Skin   Extensive Assist Verbally Surgical wounds                       Personal Care Assistance Level of Assistance  Bathing, Feeding, Dressing Bathing Assistance: Maximum assistance Feeding assistance: Maximum assistance Dressing Assistance: Maximum assistance     Functional Limitations Info             SPECIAL CARE FACTORS FREQUENCY  OT (By licensed OT), PT (By licensed PT)                    Contractures Contractures Info: Not present    Additional Factors Info  Code Status, Allergies Code Status Info: DNR Allergies Info: NKA           Current Medications (01/21/2020):  This is the current hospital active medication list Current Facility-Administered Medications  Medication Dose Route Frequency Provider Last Rate Last Admin  . [START ON 01/22/2020] 0.9 %  sodium chloride infusion   Intravenous Continuous Stegmayer, Kimberly A, PA-C      . acetaminophen (TYLENOL) tablet 650 mg  650 mg Oral Q6H PRN Lorella Nimrod, MD   650 mg at 01/20/20 [id number]   Or  . acetaminophen (TYLENOL) suppository 650 mg  650 mg Rectal Q6H PRN Lorella Nimrod, MD      . amLODipine (NORVASC) tablet 10 mg  10 mg Oral Daily Lorella Nimrod, MD   10 mg  at 01/21/20 0842  . aspirin EC tablet 81 mg  81 mg Oral Daily Lorella Nimrod, MD   81 mg at 01/21/20 0843  . atorvastatin (LIPITOR) tablet 20 mg  20 mg Oral QHS Lorella Nimrod, MD   20 mg at 01/20/20 2006  . cefTRIAXone (ROCEPHIN) 2 g in sodium chloride 0.9 % 100 mL IVPB  2 g Intravenous Q24H Lorella Nimrod, MD   Stopped at 01/20/20 2042  . diphenhydrAMINE (BENADRYL) capsule 25 mg  25 mg Oral QHS PRN Lang Snow, NP   25 mg at 01/20/20 2157  . enoxaparin (LOVENOX) injection 40 mg  40 mg Subcutaneous Q24H Lorella Nimrod, MD   40 mg at 01/20/20 2007  . feeding  supplement (ENSURE ENLIVE) (ENSURE ENLIVE) liquid 237 mL  237 mL Oral TID BM Lorella Nimrod, MD   237 mL at 01/21/20 0843  . folic acid (FOLVITE) tablet 1 mg  1 mg Oral Daily Lorella Nimrod, MD   1 mg at 01/21/20 0843  . insulin aspart (novoLOG) injection 0-5 Units  0-5 Units Subcutaneous QHS Vance Gather B, MD      . insulin aspart (novoLOG) injection 0-9 Units  0-9 Units Subcutaneous TID WC Vance Gather B, MD      . ketorolac (TORADOL) 15 MG/ML injection 15 mg  15 mg Intravenous Q6H PRN Lorella Nimrod, MD   15 mg at 01/20/20 0835  . melatonin tablet 5 mg  5 mg Oral QHS Lorella Nimrod, MD   5 mg at 01/20/20 2007  . metroNIDAZOLE (FLAGYL) tablet 500 mg  500 mg Oral Q8H Lorella Nimrod, MD   500 mg at 01/21/20 0548  . multivitamin-lutein (OCUVITE-LUTEIN) capsule 1 capsule  1 capsule Oral Daily Lorella Nimrod, MD   1 capsule at 01/21/20 0843  . ondansetron (ZOFRAN) tablet 4 mg  4 mg Oral Q6H PRN Lorella Nimrod, MD       Or  . ondansetron (ZOFRAN) injection 4 mg  4 mg Intravenous Q6H PRN Lorella Nimrod, MD      . pantoprazole (PROTONIX) EC tablet 40 mg  40 mg Oral Daily Lorella Nimrod, MD   40 mg at 01/21/20 0843  . polyethylene glycol (MIRALAX / GLYCOLAX) packet 17 g  17 g Oral Daily PRN Lorella Nimrod, MD   17 g at 01/20/20 1359  . sodium chloride flush (NS) 0.9 % injection 3 mL  3 mL Intravenous Q12H Lorella Nimrod, MD   3 mL at 01/20/20 2012  . traZODone (DESYREL) tablet 100 mg  100 mg Oral QHS Patrecia Pour, MD      . traZODone (DESYREL) tablet 50 mg  50 mg Oral QHS PRN Lang Snow, NP   50 mg at 01/21/20 0122  . vancomycin (VANCOREADY) IVPB 1750 mg/350 mL  1,750 mg Intravenous Q24H Lorella Nimrod, MD   Stopped at 01/21/20 0241  . vitamin B-12 (CYANOCOBALAMIN) tablet 1,000 mcg  1,000 mcg Oral Daily Lorella Nimrod, MD   1,000 mcg at 01/21/20 [id number]     Discharge Medications: Please see discharge summary for a list of discharge medications.  Relevant Imaging Results:  Relevant Lab  Results:   Additional Information SS# [id number]  Shelbie Ammons, RN

## 2020-01-21 NOTE — TOC Initial Note (Signed)
Transition of Care [facility]) - Initial/Assessment Note    Patient Details  Name: Alexander Alvarado MRN: [id number] Date of Birth: [birth year]  Transition of Care [facility]) CM/SW Contact:    Shelbie Ammons, RN Phone Number: 01/21/2020, 10:30 AM  Clinical Narrative:     RNCM assessed patient at bedside. Patient is from home but doesn't think he will be able to return there. Patient is open to going to a SNF and reports he will go anywhere except [facility], states he would prefer to go to [facility] as this is near his home. RNCM confirmed PASSR, completed FL2 and initiated bed search.               Expected Discharge Plan: [place]     Patient Goals and CMS Choice     Choice offered to / list presented to : Patient  Expected Discharge Plan and Services Expected Discharge Plan: [place]   Discharge Planning Services: CM Consult Post Acute Care Choice: [place] Living arrangements for the past 2 months: Single Family Home                                      Prior Living Arrangements/Services Living arrangements for the past 2 months: Single Family Home Lives with:: Self Patient language and need for interpreter reviewed:: Yes Do you feel safe going back to the place where you live?: Yes      Need for Family Participation in Patient Care: Yes (Comment) Care giver support system in place?: Yes (comment)   Criminal Activity/Legal Involvement Pertinent to Current Situation/Hospitalization: No - Comment as needed  Activities of Daily Living Home Assistive Devices/Equipment: None ADL Screening (condition at time of admission) Patient's cognitive ability adequate to safely complete daily activities?: No Is the patient deaf or have difficulty hearing?: No Does the patient have difficulty seeing, even when wearing glasses/contacts?: No Does the patient have difficulty concentrating, remembering, or making decisions?:  No Patient able to express need for assistance with ADLs?: No Does the patient have difficulty dressing or bathing?: No Independently performs ADLs?: No Communication: Independent Does the patient have difficulty walking or climbing stairs?: Yes Weakness of Legs: Left Weakness of Arms/Hands: None  Permission Sought/Granted                  Emotional Assessment Appearance:: Appears stated age Attitude/Demeanor/Rapport: Engaged Affect (typically observed): Appropriate Orientation: : Oriented to Self, Oriented to Place, Oriented to  Time, Oriented to Situation Alcohol / Substance Use: Not Applicable Psych Involvement: No (comment)  Admission diagnosis:  PAD (peripheral artery disease) ([place]) [I73.9] Left-sided weakness [R53.1] Osteomyelitis of left foot, unspecified type (HCC) [M86.9] Osteomyelitis of third toe of left foot ([place]) [M86.9] Patient Active Problem List   Diagnosis Date Noted  . Malnutrition of moderate degree 01/19/2020  . Osteomyelitis of left foot ([place]) 01/18/2020  . Left-sided weakness   . Closed hip fracture requiring operative repair, right, sequela   . Acute on chronic respiratory failure with hypoxia ([place])   . Aspiration pneumonia of right lower lobe due to gastric secretions ([place])   . Tachycardia   . Femur fracture, right ([place]) 09/10/2019  . Amputation of toe of left foot ([place]) 02/10/2019  . Pre-diabetes 01/07/2017  . B12 nutritional deficiency 12/07/2016  . Folic acid deficiency [id number]  . Cerebrovascular accident (CVA) due to occlusion of cerebral artery ([place]) 12/30/2015  .  Senile ecchymosis 12/30/2015  . Essential hypertension 03/12/2015  . Gastro-esophageal reflux disease without esophagitis 03/12/2015  . Hyperlipidemia, mixed 03/12/2015  . Insomnia, persistent 03/12/2015  . Neoplasm of uncertain behavior of skin of hand 03/12/2015  . Arthritis, degenerative 03/12/2015  . PAD (peripheral artery disease) ([place]) 03/12/2015  . Compulsive tobacco  user syndrome 03/12/2015  . H/O alcohol abuse 10/29/2012   PCP:  Glean Hess, MD Pharmacy:   [facility] [address] [place] - [place] [place] [address] Phone: [phone] Fax: [phone]     Social Determinants of Health (SDOH) Interventions    Readmission Risk Interventions No flowsheet data found.

## 2020-01-21 NOTE — Care Management Important Message (Signed)
Important Message  Patient Details  Name: Alexander Alvarado MRN: [id number] Date of Birth: [birth year]   Medicare Important Message Given:  Yes     Loann Quill 01/21/2020, 11:53 AM

## 2020-01-21 NOTE — Progress Notes (Signed)
PROGRESS NOTE  Alexander Alvarado  [id number] DOB: Nov 11, 1940 DOA: 01/18/2020 PCP: Glean Hess, MD   Brief Narrative: [name] a 79 y.o.malewith medical history significant ofhypertension, peripheral vascular diseases/pamputation of second toe and CVA  Brought to ED with concern of left-sided weakness which started this morning.Per patient he has some residual weakness on left side but when he got up this morning he was unable to move his left side.He normally walks with the help of walker since his prior stroke,he could not grasp a cup or walk .Hissymptoms started gradually improving as the day passes. He denies any facial droop, denies any change in his vision. He has left-sided drooping eyelid for a long time.  He was also complaining of worsening pain in his left foot. He has chronic wounds in his left third toe and heel. Apparently seen by in wound care nurse at home and getting treatment with no relief. He denies using any antibiotics or seen a physician for that. He has his second toe amputated due to osteo in the past. He denies any fever or chills. No upper respiratory symptoms. He lives alone and denies any sick contacts. He was also complaining of some increased urinary urgency and frequency. Denies any dysuria, hematuria or lower abdominal pain.  Assessment & Plan: Active Problems:   PAD (peripheral artery disease) (HCC)   Osteomyelitis of left foot (HCC)   Malnutrition of moderate degree  Left-sided weakness: Stable, more sensory component than motor on my exam. CT head initially without abnormality. LDL 42. - Pt declines MRI/MRA due to inability to lay flat due to sinus issues. Can consider repeat CT head in the future.  - Continue PT, OT  Left 3rd toe osteomyelitis and left heel ulceration complicated by PAD: No clinical evidence of venous insufficiency. CRP 3.3 - Vascular surgery consulted, planning angiogram 4/2. Pt has hx  revascularization in the past. ABI consistent with L > R moderate PAD.  - Podiatry planning left heel debridement and left 3rd toe amputations with possible taking metatarsal head 4/3. Will need to hold lovenox 24hrs prior to this. - Continue broad spectrum antibiotics. Would benefit from intraoperative cultures. Not entirely clear how significant superficial culture results are. - Keep on SSI for tight glucose control. HbA1c consistent with prediabetes at 5.8%. - Augment pain control with gabapentin for neuropathic component. Also add tramadol. Continue tylenol, toradol.  Tobacco use: Long term.  - Cessation counseling provided, drawing line between his use and current vascular issues.   HTN:  - Restarted home antihypertensives.   Urinary urgency, frequency:  - Negative UA.  - Bladder scan prn.  Insomnia:  - Increase trazodone dose  RN Pressure Injury Documentation: Pressure Injury 01/20/20 Toe (Comment  which one) Anterior;Left Black dried wound (Active)  01/20/20 0830  Location: Toe (Comment  which one)  Location Orientation: Anterior;Left  Staging:   Wound Description (Comments): Black dried wound  Present on Admission:      Pressure Injury 01/20/20 Heel Left (Active)  01/20/20 0830  Location: Heel  Location Orientation: Left  Staging:   Wound Description (Comments):   Present on Admission:    Moderate protein calorie malnutrition. Supplement protein as able to optimize wound healing.   DVT prophylaxis: Lovenox Code Status: DNR Family Communication: None at bedside Disposition Plan: Patient from home, will require SNF at discharge post operatively.   Consultants:   Podiatry, Dr. Luana Shu  Vascular surgery  Procedures:   None  Antimicrobials:  Vancomycin, ceftriaxone, flagyl  Subjective: Still having severe insomnia, not improved with trazodone, though he feels sleepy just can't sleep. Pain is moderate, constant, burning. No chest pain dyspnea or  bleeding.  Objective: Vitals:   01/20/20 0755 01/20/20 1547 01/20/20 2326 01/21/20 0755  BP: (!) 151/79 (!) 148/82 (!) 143/76 138/78  Pulse: 76 87 72 69  Resp: 17 17 18 16   Temp: 98.5 F (36.9 C) 98.3 F (36.8 C) 98.5 F (36.9 C) 98.9 F (37.2 C)  TempSrc: Oral Oral Oral   SpO2: 96% 92% 91% 94%  Weight:      Height:        Intake/Output Summary (Last 24 hours) at 01/21/2020 1624 Last data filed at 01/21/2020 1441 Gross per 24 hour  Intake 1126.74 ml  Output 500 ml  Net 626.74 ml   Filed Weights   01/18/20 1244 01/19/20 1610  Weight: 68 kg 73.8 kg   Gen: 79 y.o. male in no distress Pulm: Nonlabored breathing room air. Clear. CV: Regular rate and rhythm. No murmur, rub, or gallop. No JVD, no dependent edema. GI: Abdomen soft, non-tender, non-distended, with normoactive bowel sounds.  Ext: Warm, no deformities Skin: No new rashes, lesions or ulcers on visualized skin. Left foot dressing c/d/i, boots in place bilaterally. Neuro: Alert and oriented. No focal neurological deficits. Psych: Judgement and insight appear fair. Mood euthymic & affect congruent. Behavior is appropriate.    Data Reviewed: I have personally reviewed following labs and imaging studies  CBC: Recent Labs  Lab 01/18/20 1246 01/19/20 0513  WBC 14.1* 11.1*  NEUTROABS 10.7*  --   HGB 18.2* 16.4  HCT 54.7* 49.6  MCV 92.4 93.6  PLT 279 [id number]   Basic Metabolic Panel: Recent Labs  Lab 01/18/20 1246 01/19/20 0513 01/21/20 0500  NA 136 138  --   K 3.5 3.9  --   CL 98 101  --   CO2 27 27  --   GLUCOSE 174* 90  --   BUN 11 12  --   CREATININE 0.61 0.69 0.66  CALCIUM 9.2 8.9  --    GFR: Estimated Creatinine Clearance: 81.1 mL/min (by C-G formula based on SCr of 0.66 mg/dL). Liver Function Tests: Recent Labs  Lab 01/18/20 1246  AST 16  ALT 14  ALKPHOS 133*  BILITOT 0.7  PROT 7.7  ALBUMIN 4.0   No results for input(s): LIPASE, AMYLASE in the last 168 hours. No results for input(s): AMMONIA  in the last 168 hours. Coagulation Profile: No results for input(s): INR, PROTIME in the last 168 hours. Cardiac Enzymes: No results for input(s): CKTOTAL, CKMB, CKMBINDEX, TROPONINI in the last 168 hours. BNP (last 3 results) No results for input(s): PROBNP in the last 8760 hours. HbA1C: Recent Labs    01/19/20 0513  HGBA1C 5.8*   CBG: Recent Labs  Lab 01/20/20 2110 01/21/20 0752 01/21/20 1151  GLUCAP 132* 127* 161*   Lipid Profile: Recent Labs    01/19/20 0513  CHOL 105  HDL 28*  LDLCALC 42  TRIG 175*  CHOLHDL 3.8   Thyroid Function Tests: No results for input(s): TSH, T4TOTAL, FREET4, T3FREE, THYROIDAB in the last 72 hours. Anemia Panel: No results for input(s): VITAMINB12, FOLATE, FERRITIN, TIBC, IRON, RETICCTPCT in the last 72 hours. Urine analysis:    Component Value Date/Time   COLORURINE YELLOW (A) 01/19/2020 1516   APPEARANCEUR CLEAR (A) 01/19/2020 1516   APPEARANCEUR Hazy 07/14/2012 2232   LABSPEC 1.010 01/19/2020 1516   LABSPEC 1.024 07/14/2012 2232   PHURINE  7.0 01/19/2020 [place] 01/19/2020 1516   GLUCOSEU Negative 07/14/2012 2232   HGBUR NEGATIVE 01/19/2020 1516   [place] 01/19/2020 1516   BILIRUBINUR neg 02/10/2019 0847   BILIRUBINUR Negative 07/14/2012 2232   KETONESUR NEGATIVE 01/19/2020 1516   PROTEINUR NEGATIVE 01/19/2020 1516   UROBILINOGEN 0.2 02/10/2019 0847   NITRITE NEGATIVE 01/19/2020 1516   LEUKOCYTESUR SMALL (A) 01/19/2020 1516   LEUKOCYTESUR Negative 07/14/2012 2232   Recent Results (from the past 240 hour(s))  SARS CORONAVIRUS 2 (TAT 6-24 HRS) Nasopharyngeal Nasopharyngeal Swab     Status: None   Collection Time: 01/18/20  4:26 PM   Specimen: Nasopharyngeal Swab  Result Value Ref Range Status   SARS Coronavirus 2 NEGATIVE NEGATIVE Final    Comment: (NOTE) SARS-CoV-2 target nucleic acids are NOT DETECTED. The SARS-CoV-2 RNA is generally detectable in upper and lower respiratory specimens during  the acute phase of infection. Negative results do not preclude SARS-CoV-2 infection, do not rule out co-infections with other pathogens, and should not be used as the sole basis for treatment or other patient management decisions. Negative results must be combined with clinical observations, patient history, and epidemiological information. The expected result is Negative. Fact Sheet for Patients: SugarRoll.be Fact Sheet for Healthcare Providers: https://www.woods-mathews.com/ This test is not yet approved or cleared by the [place] FDA and  has been authorized for detection and/or diagnosis of SARS-CoV-2 by FDA under an Emergency Use Authorization (EUA). This EUA will remain  in effect (meaning this test can be used) for the duration of the COVID-19 declaration under Section 56 4(b)(1) of the Act, 21 U.S.C. section 360bbb-3(b)(1), unless the authorization is terminated or revoked sooner. Performed at [facility], [place] [address], [place], [place] [address]   Wound or Superficial Culture     Status: None   Collection Time: 01/18/20  4:53 PM   Specimen: Wound  Result Value Ref Range Status   Specimen Description   Final    WOUND Performed at [facility], [address], [place], [place] [address]    Special Requests   Final    LEFT 3RD TOE Performed at [facility], [employer], [place] [address]    Gram Stain   Final    NO WBC SEEN MODERATE GRAM POSITIVE COCCI MODERATE GRAM POSITIVE RODS RARE GRAM NEGATIVE RODS    Culture   Final    FEW METHICILLIN RESISTANT STAPHYLOCOCCUS AUREUS WITHIN MIXED ORGANISMS Performed at [facility], [address], [place], [place] [address]    Report Status 01/21/2020 FINAL  Final   Organism ID, Bacteria METHICILLIN RESISTANT STAPHYLOCOCCUS AUREUS  Final      Susceptibility   Methicillin resistant staphylococcus aureus - MIC*    CIPROFLOXACIN >=8  RESISTANT Resistant     ERYTHROMYCIN >=8 RESISTANT Resistant     GENTAMICIN <=0.5 SENSITIVE Sensitive     OXACILLIN >=4 RESISTANT Resistant     TETRACYCLINE <=1 SENSITIVE Sensitive     VANCOMYCIN <=0.5 SENSITIVE Sensitive     TRIMETH/SULFA <=10 SENSITIVE Sensitive     CLINDAMYCIN <=0.25 SENSITIVE Sensitive     RIFAMPIN <=0.5 SENSITIVE Sensitive     Inducible Clindamycin NEGATIVE Sensitive     * FEW METHICILLIN RESISTANT STAPHYLOCOCCUS AUREUS  Wound or Superficial Culture     Status: None   Collection Time: 01/18/20  4:53 PM   Specimen: Wound  Result Value Ref Range Status   Specimen Description   Final  WOUND Performed at [facility], [address] [place], [place] [address]    Special Requests   Final    LEFT HEEL Performed at [facility], [place], [place] [address]    Gram Stain   Final    NO WBC SEEN RARE GRAM POSITIVE COCCI Performed at [facility], [place] [address] [place], [place] [address]    Culture FEW METHICILLIN RESISTANT STAPHYLOCOCCUS AUREUS  Final   Report Status 01/21/2020 FINAL  Final   Organism ID, Bacteria METHICILLIN RESISTANT STAPHYLOCOCCUS AUREUS  Final      Susceptibility   Methicillin resistant staphylococcus aureus - MIC*    CIPROFLOXACIN >=8 RESISTANT Resistant     ERYTHROMYCIN >=8 RESISTANT Resistant     GENTAMICIN <=0.5 SENSITIVE Sensitive     OXACILLIN >=4 RESISTANT Resistant     TETRACYCLINE <=1 SENSITIVE Sensitive     VANCOMYCIN 1 SENSITIVE Sensitive     TRIMETH/SULFA <=10 SENSITIVE Sensitive     CLINDAMYCIN <=0.25 SENSITIVE Sensitive     RIFAMPIN <=0.5 SENSITIVE Sensitive     Inducible Clindamycin NEGATIVE Sensitive     * FEW METHICILLIN RESISTANT STAPHYLOCOCCUS AUREUS      Radiology Studies: No results found.  Scheduled Meds: . amLODipine  10 mg Oral Daily  . aspirin EC  81 mg Oral Daily  . atorvastatin  20 mg Oral QHS  . enoxaparin (LOVENOX) injection  40 mg Subcutaneous Q24H  .  feeding supplement (ENSURE ENLIVE)  237 mL Oral TID BM  . folic acid  1 mg Oral Daily  . insulin aspart  0-5 Units Subcutaneous QHS  . insulin aspart  0-9 Units Subcutaneous TID WC  . melatonin  5 mg Oral QHS  . metroNIDAZOLE  500 mg Oral Q8H  . multivitamin-lutein  1 capsule Oral Daily  . pantoprazole  40 mg Oral Daily  . sodium chloride flush  3 mL Intravenous Q12H  . traZODone  100 mg Oral QHS  . vitamin B-12  1,000 mcg Oral Daily   Continuous Infusions: . [START ON 01/22/2020] sodium chloride    . cefTRIAXone (ROCEPHIN)  IV Stopped (01/20/20 2042)  . vancomycin Stopped (01/21/20 0241)     LOS: 3 days   Time spent: 25 minutes.  Patrecia Pour, MD [facility] [email or web address] 01/21/2020, 4:24 PM

## 2020-01-22 ENCOUNTER — Encounter: Payer: Self-pay | Admitting: Anesthesiology

## 2020-01-22 ENCOUNTER — Inpatient Hospital Stay: Payer: Medicare HMO

## 2020-01-22 DIAGNOSIS — L97429 Non-pressure chronic ulcer of left heel and midfoot with unspecified severity: Secondary | ICD-10-CM

## 2020-01-22 DIAGNOSIS — F17210 Nicotine dependence, cigarettes, uncomplicated: Secondary | ICD-10-CM

## 2020-01-22 DIAGNOSIS — I69354 Hemiplegia and hemiparesis following cerebral infarction affecting left non-dominant side: Secondary | ICD-10-CM

## 2020-01-22 DIAGNOSIS — Z89422 Acquired absence of other left toe(s): Secondary | ICD-10-CM

## 2020-01-22 DIAGNOSIS — B9562 Methicillin resistant Staphylococcus aureus infection as the cause of diseases classified elsewhere: Secondary | ICD-10-CM

## 2020-01-22 DIAGNOSIS — L97419 Non-pressure chronic ulcer of right heel and midfoot with unspecified severity: Secondary | ICD-10-CM

## 2020-01-22 DIAGNOSIS — Z87311 Personal history of (healed) other pathological fracture: Secondary | ICD-10-CM

## 2020-01-22 DIAGNOSIS — I10 Essential (primary) hypertension: Secondary | ICD-10-CM

## 2020-01-22 LAB — COMPREHENSIVE METABOLIC PANEL WITH GFR
ALT: 12 U/L (ref 0–44)
AST: 17 U/L (ref 15–41)
Albumin: 2.9 g/dL — ABNORMAL LOW (ref 3.5–5.0)
Alkaline Phosphatase: 82 U/L (ref 38–126)
Anion gap: 10 (ref 5–15)
BUN: 12 mg/dL (ref 8–23)
CO2: 27 mmol/L (ref 22–32)
Calcium: 8.8 mg/dL — ABNORMAL LOW (ref 8.9–10.3)
Chloride: 102 mmol/L (ref 98–111)
Creatinine, Ser: 0.65 mg/dL (ref 0.61–1.24)
GFR calc Af Amer: >60 mL/min (ref >60)
GFR calc non Af Amer: >60 mL/min (ref >60)
Glucose, Bld: 121 mg/dL — ABNORMAL HIGH (ref 70–99)
Potassium: 3.2 mmol/L — ABNORMAL LOW (ref 3.5–5.1)
Sodium: 139 mmol/L (ref 135–145)
Total Bilirubin: 0.6 mg/dL (ref 0.3–1.2)
Total Protein: 6 g/dL — ABNORMAL LOW (ref 6.5–8.1)

## 2020-01-22 LAB — CBC
HCT: 48.7 % (ref 39.0–52.0)
Hemoglobin: 15.8 g/dL (ref 13.0–17.0)
MCH: 30.5 pg (ref 26.0–34.0)
MCHC: 32.4 g/dL (ref 30.0–36.0)
MCV: 94 fL (ref 80.0–100.0)
Platelets: 263 10*3/uL (ref 150–400)
RBC: 5.18 MIL/uL (ref 4.22–5.81)
RDW: 13.6 % (ref 11.5–15.5)
WBC: 11 10*3/uL — ABNORMAL HIGH (ref 4.0–10.5)
nRBC: 0 % (ref 0.0–0.2)

## 2020-01-22 LAB — MAGNESIUM: Magnesium: 1.8 mg/dL (ref 1.7–2.4)

## 2020-01-22 MED ORDER — DIPHENHYDRAMINE HCL 50 MG/ML IJ SOLN: 50.0000 mg | Freq: Once | INTRAMUSCULAR | Status: DC | PRN

## 2020-01-22 MED ORDER — CEFAZOLIN SODIUM-DEXTROSE 2-4 GM/100ML-% IV SOLN
2.0000 g | INTRAVENOUS | Status: DC
  Filled 2020-01-22: qty 100

## 2020-01-22 MED ORDER — HYDROMORPHONE HCL 1 MG/ML IJ SOLN
1.0000 mg | Freq: Once | INTRAMUSCULAR | Status: AC | PRN
  Administered 2020-01-24: 1 mg via INTRAVENOUS
  Filled 2020-01-22: qty 1

## 2020-01-22 MED ORDER — CHLORHEXIDINE GLUCONATE 4 % EX LIQD: 60.0000 mL | Freq: Once | CUTANEOUS | Status: DC

## 2020-01-22 MED ORDER — POTASSIUM CHLORIDE IN NACL 20-0.45 MEQ/L-% IV SOLN
INTRAVENOUS | Status: DC
  Filled 2020-01-22 (×13): qty 1000

## 2020-01-22 MED ORDER — METHYLPREDNISOLONE SODIUM SUCC 125 MG IJ SOLR: 125.0000 mg | Freq: Once | INTRAMUSCULAR | Status: DC | PRN

## 2020-01-22 MED ORDER — METRONIDAZOLE 500 MG PO TABS
500.0000 mg | ORAL_TABLET | Freq: Three times a day (TID) | ORAL | Status: DC
  Administered 2020-01-22 – 2020-01-29 (×17): 500 mg via ORAL
  Filled 2020-01-22 (×19): qty 1

## 2020-01-22 MED ORDER — MIDAZOLAM HCL 2 MG/ML PO SYRP: 8.0000 mg | ORAL_SOLUTION | Freq: Once | ORAL | Status: DC | PRN

## 2020-01-22 MED ORDER — SODIUM CHLORIDE 0.9 % IV SOLN: INTRAVENOUS | Status: DC

## 2020-01-22 MED ORDER — ONDANSETRON HCL 4 MG/2ML IJ SOLN: 4.0000 mg | Freq: Four times a day (QID) | INTRAMUSCULAR | Status: DC | PRN

## 2020-01-22 MED ORDER — ENSURE PRE-SURGERY PO LIQD
296.0000 mL | Freq: Once | ORAL | Status: DC
  Filled 2020-01-22: qty 296

## 2020-01-22 MED ORDER — CEFAZOLIN SODIUM-DEXTROSE 2-4 GM/100ML-% IV SOLN
INTRAVENOUS | Status: AC
  Filled 2020-01-22: qty 100

## 2020-01-22 MED ORDER — QUETIAPINE FUMARATE 25 MG PO TABS
25.0000 mg | ORAL_TABLET | Freq: Every evening | ORAL | Status: DC | PRN
  Administered 2020-01-22 – 2020-01-31 (×10): 25 mg via ORAL
  Filled 2020-01-22 (×11): qty 1

## 2020-01-22 MED ORDER — FAMOTIDINE 20 MG PO TABS: 40.0000 mg | ORAL_TABLET | Freq: Once | ORAL | Status: DC | PRN

## 2020-01-22 NOTE — Progress Notes (Signed)
PROGRESS NOTE  Alexander Alvarado  [id number] DOB: [birth year] DOA: 01/18/2020 PCP: Glean Hess, MD   Brief Narrative: Chistian Burruel Hunteris a 79 y.o.malewith medical history significant ofhypertension, peripheral vascular diseases/pamputation of second toe and CVA  Brought to ED with concern of left-sided weakness which started this morning.Per patient he has some residual weakness on left side but when he got up this morning he was unable to move his left side.He normally walks with the help of walker since his prior stroke,he could not grasp a cup or walk .Hissymptoms started gradually improving as the day passes. He denies any facial droop, denies any change in his vision. He has left-sided drooping eyelid for a long time.  He was also complaining of worsening pain in his left foot. He has chronic wounds in his left third toe and heel. Apparently seen by in wound care nurse at home and getting treatment with no relief. He denies using any antibiotics or seen a physician for that. He has his second toe amputated due to osteo in the past. He denies any fever or chills. No upper respiratory symptoms. He lives alone and denies any sick contacts. He was also complaining of some increased urinary urgency and frequency. Denies any dysuria, hematuria or lower abdominal pain.  Assessment & Plan: Active Problems:   PAD (peripheral artery disease) (HCC)   Osteomyelitis of left foot (HCC)   Malnutrition of moderate degree  Left-sided weakness: Stable, more sensory component than motor on my exam. CT head initially without abnormality. LDL 42. - Pt declines MRI/MRA due to inability to lay flat due to sinus issues. Can consider repeat CT head in the future.  - Continue PT, OT  Left 3rd toe osteomyelitis and left heel ulceration complicated by PAD: No clinical evidence of venous insufficiency. CRP 3.3 - Vascular surgery consulted, will need to reschedule angiogram under  anesthesia due to patient's inability to lay flat. Pt does not particularly report orthopnea and no other symptoms of heart failure, will consider diuresis. Pt has hx revascularization in the past. ABI consistent with L > R moderate PAD.  - Podiatry planning left heel debridement and left 3rd toe amputations with possible taking metatarsal head after revascularization. Will need to hold lovenox 24hrs prior to this. - Continue broad spectrum antibiotics. Would benefit from intraoperative cultures. Not entirely clear how significant superficial culture results are. ID consulted for recommendations regarding antimicrobial therapy. - Keep on SSI for tight glucose control. HbA1c consistent with prediabetes at 5.8%. - Continue gabapentin for neuropathic component, prn tramadol. Continue tylenol, toradol.  Tobacco use: Long term.  - Cessation counseling provided, drawing line between his use and current vascular issues.   Acute hypoxic respiratory failure: Unclear etiology though patient has long history of tobacco use and has required supplemental oxygen in past admissions, had aspiration pneumonia.  - Check CXR. Consider diuresis if evidence of CHF. LVEF was felt to be about 45% with global hypokinesis on echo which was performed for CVA evaluation. This was not a comprehensive echo.   HTN:  - Restarted home antihypertensives.   Urinary urgency, frequency:  - Negative UA.  - Bladder scan prn.  Insomnia:  - Increase trazodone dose  RN Pressure Injury Documentation: Pressure Injury 01/20/20 Toe (Comment  which one) Anterior;Left Black dried wound (Active)  01/20/20 0830  Location: Toe (Comment  which one)  Location Orientation: Anterior;Left  Staging:   Wound Description (Comments): Black dried wound  Present on Admission:  Pressure Injury 01/20/20 Heel Left (Active)  01/20/20 0830  Location: Heel  Location Orientation: Left  Staging:   Wound Description (Comments):   Present on  Admission:    Moderate protein calorie malnutrition. Supplement protein as able to optimize wound healing.   DVT prophylaxis: Lovenox Code Status: DNR Family Communication: None at bedside Disposition Plan: Patient from home, will likely require SNF at discharge post operatively.   Consultants:   Podiatry, Dr. Luana Shu  Vascular surgery  Infectious diseases  Procedures:   None  Antimicrobials:  Vancomycin, ceftriaxone, flagyl  Subjective: No complaints this morning. He's actually laying quite flat reporting that he had a great night's sleep finally and has no shortness of breath or pain. No symptoms of aspiration reported.   Objective: Vitals:   01/21/20 2208 01/22/20 0700 01/22/20 1037 01/22/20 1040  BP: 139/62 (!) 118/57 137/80 137/80  Pulse: 79 63 76 83  Resp: 17 18 17 17   Temp: 98.1 F (36.7 C) 98.7 F (37.1 C)    TempSrc:  Axillary    SpO2: 90% 90% (!) 88% 92%  Weight:      Height:        Intake/Output Summary (Last 24 hours) at 01/22/2020 1300 Last data filed at 01/22/2020 0522 Gross per 24 hour  Intake 120 ml  Output 200 ml  Net -80 ml   Filed Weights   01/18/20 1244 01/19/20 1610  Weight: 68 kg 73.8 kg   Gen: 79 y.o. male in no distress Pulm: Nonlabored breathing, no crackles or wheezes. CV: Regular rate and rhythm. No murmur, rub, or gallop. No JVD, no dependent edema at all. GI: Abdomen soft, non-tender, non-distended, with normoactive bowel sounds.  Ext: Warm, no deformities Skin: No new rashes, lesions or ulcers on visualized skin. Left foot wrapped, c/d/i. Neuro: Alert and oriented. No new deficits. Psych: Judgement and insight appear fair. Mood euthymic & affect congruent. Behavior is appropriate.    Data Reviewed: I have personally reviewed following labs and imaging studies  CBC: Recent Labs  Lab 01/18/20 1246 01/19/20 0513 01/22/20 0500  WBC 14.1* 11.1* 11.0*  NEUTROABS 10.7*  --   --   HGB 18.2* 16.4 15.8  HCT 54.7* 49.6 48.7  MCV  92.4 93.6 94.0  PLT 279 253 [id number]   Basic Metabolic Panel: Recent Labs  Lab 01/18/20 1246 01/19/20 0513 01/21/20 0500 01/22/20 0500  NA 136 138  --  139  K 3.5 3.9  --  3.2*  CL 98 101  --  102  CO2 27 27  --  27  GLUCOSE 174* 90  --  121*  BUN 11 12  --  12  CREATININE 0.61 0.69 0.66 0.65  CALCIUM 9.2 8.9  --  8.8*  MG  --   --   --  1.8   GFR: Estimated Creatinine Clearance: 81.1 mL/min (by C-G formula based on SCr of 0.65 mg/dL). Liver Function Tests: Recent Labs  Lab 01/18/20 1246 01/22/20 0500  AST 16 17  ALT 14 12  ALKPHOS 133* 82  BILITOT 0.7 0.6  PROT 7.7 6.0*  ALBUMIN 4.0 2.9*   No results for input(s): LIPASE, AMYLASE in the last 168 hours. No results for input(s): AMMONIA in the last 168 hours. Coagulation Profile: No results for input(s): INR, PROTIME in the last 168 hours. Cardiac Enzymes: No results for input(s): CKTOTAL, CKMB, CKMBINDEX, TROPONINI in the last 168 hours. BNP (last 3 results) No results for input(s): PROBNP in the last 8760 hours. HbA1C: No  results for input(s): HGBA1C in the last 72 hours. CBG: Recent Labs  Lab 01/20/20 2110 01/21/20 0752 01/21/20 1151 01/21/20 1734 01/21/20 2129  GLUCAP 132* 127* 161* 136* 147*   Lipid Profile: No results for input(s): CHOL, HDL, LDLCALC, TRIG, CHOLHDL, LDLDIRECT in the last 72 hours. Thyroid Function Tests: No results for input(s): TSH, T4TOTAL, FREET4, T3FREE, THYROIDAB in the last 72 hours. Anemia Panel: No results for input(s): VITAMINB12, FOLATE, FERRITIN, TIBC, IRON, RETICCTPCT in the last 72 hours. Urine analysis:    Component Value Date/Time   COLORURINE YELLOW (A) 01/19/2020 1516   APPEARANCEUR CLEAR (A) 01/19/2020 1516   APPEARANCEUR Hazy 07/14/2012 2232   LABSPEC 1.010 01/19/2020 1516   LABSPEC 1.024 07/14/2012 2232   PHURINE 7.0 01/19/2020 1516   GLUCOSEU NEGATIVE 01/19/2020 1516   GLUCOSEU Negative 07/14/2012 2232   HGBUR NEGATIVE 01/19/2020 1516   BILIRUBINUR NEGATIVE  01/19/2020 1516   BILIRUBINUR neg 02/10/2019 0847   BILIRUBINUR Negative 07/14/2012 2232   KETONESUR NEGATIVE 01/19/2020 1516   PROTEINUR NEGATIVE 01/19/2020 1516   UROBILINOGEN 0.2 02/10/2019 0847   NITRITE NEGATIVE 01/19/2020 1516   LEUKOCYTESUR SMALL (A) 01/19/2020 1516   LEUKOCYTESUR Negative 07/14/2012 2232   Recent Results (from the past 240 hour(s))  SARS CORONAVIRUS 2 (TAT 6-24 HRS) Nasopharyngeal Nasopharyngeal Swab     Status: None   Collection Time: 01/18/20  4:26 PM   Specimen: Nasopharyngeal Swab  Result Value Ref Range Status   SARS Coronavirus 2 NEGATIVE NEGATIVE Final    Comment: (NOTE) SARS-CoV-2 target nucleic acids are NOT DETECTED. The SARS-CoV-2 RNA is generally detectable in upper and lower respiratory specimens during the acute phase of infection. Negative results do not preclude SARS-CoV-2 infection, do not rule out co-infections with other pathogens, and should not be used as the sole basis for treatment or other patient management decisions. Negative results must be combined with clinical observations, patient history, and epidemiological information. The expected result is Negative. Fact Sheet for Patients: SugarRoll.be Fact Sheet for Healthcare Providers: https://www.woods-mathews.com/ This test is not yet approved or cleared by the [place] FDA and  has been authorized for detection and/or diagnosis of SARS-CoV-2 by FDA under an Emergency Use Authorization (EUA). This EUA will remain  in effect (meaning this test can be used) for the duration of the COVID-19 declaration under Section 56 4(b)(1) of the Act, 21 U.S.C. section 360bbb-3(b)(1), unless the authorization is terminated or revoked sooner. Performed at [facility], [place] [address], [place], [place] [address]   Wound or Superficial Culture     Status: None   Collection Time: 01/18/20  4:53 PM   Specimen: Wound  Result Value Ref Range  Status   Specimen Description   Final    WOUND Performed at [facility], [address], [place], [place] [address]    Special Requests   Final    LEFT 3RD TOE Performed at [facility], [place], [place] [address]    Gram Stain   Final    NO WBC SEEN MODERATE GRAM POSITIVE COCCI MODERATE GRAM POSITIVE RODS RARE GRAM NEGATIVE RODS    Culture   Final    FEW METHICILLIN RESISTANT STAPHYLOCOCCUS AUREUS WITHIN MIXED ORGANISMS Performed at [facility], [address] [place], [place] [address]    Report Status 01/21/2020 FINAL  Final   Organism ID, Bacteria METHICILLIN RESISTANT STAPHYLOCOCCUS AUREUS  Final      Susceptibility   Methicillin resistant staphylococcus aureus -  MIC*    CIPROFLOXACIN >=8 RESISTANT Resistant     ERYTHROMYCIN >=8 RESISTANT Resistant     GENTAMICIN <=0.5 SENSITIVE Sensitive     OXACILLIN >=4 RESISTANT Resistant     TETRACYCLINE <=1 SENSITIVE Sensitive     VANCOMYCIN <=0.5 SENSITIVE Sensitive     TRIMETH/SULFA <=10 SENSITIVE Sensitive     CLINDAMYCIN <=0.25 SENSITIVE Sensitive     RIFAMPIN <=0.5 SENSITIVE Sensitive     Inducible Clindamycin NEGATIVE Sensitive     * FEW METHICILLIN RESISTANT STAPHYLOCOCCUS AUREUS  Wound or Superficial Culture     Status: None   Collection Time: 01/18/20  4:53 PM   Specimen: Wound  Result Value Ref Range Status   Specimen Description   Final    WOUND Performed at [facility], [address] [place], [place] [address]    Special Requests   Final    LEFT HEEL Performed at [facility], [address] [place], [place] [address]    Gram Stain   Final    NO WBC SEEN RARE GRAM POSITIVE COCCI Performed at [facility], [place] [address] [place], [place] [address]    Culture FEW METHICILLIN RESISTANT STAPHYLOCOCCUS AUREUS  Final   Report Status 01/21/2020 FINAL  Final   Organism ID, Bacteria METHICILLIN RESISTANT STAPHYLOCOCCUS AUREUS  Final       Susceptibility   Methicillin resistant staphylococcus aureus - MIC*    CIPROFLOXACIN >=8 RESISTANT Resistant     ERYTHROMYCIN >=8 RESISTANT Resistant     GENTAMICIN <=0.5 SENSITIVE Sensitive     OXACILLIN >=4 RESISTANT Resistant     TETRACYCLINE <=1 SENSITIVE Sensitive     VANCOMYCIN 1 SENSITIVE Sensitive     TRIMETH/SULFA <=10 SENSITIVE Sensitive     CLINDAMYCIN <=0.25 SENSITIVE Sensitive     RIFAMPIN <=0.5 SENSITIVE Sensitive     Inducible Clindamycin NEGATIVE Sensitive     * FEW METHICILLIN RESISTANT STAPHYLOCOCCUS AUREUS      Radiology Studies: No results found.  Scheduled Meds: . amLODipine  10 mg Oral Daily  . aspirin EC  81 mg Oral Daily  . atorvastatin  20 mg Oral QHS  . chlorhexidine  60 mL Topical Once  . feeding supplement (ENSURE ENLIVE)  237 mL Oral TID BM  . feeding supplement  296 mL Oral Once  . folic acid  1 mg Oral Daily  . gabapentin  100 mg Oral TID  . melatonin  5 mg Oral QHS  . metroNIDAZOLE  500 mg Oral Q8H  . multivitamin-lutein  1 capsule Oral Daily  . pantoprazole  40 mg Oral Daily  . sodium chloride flush  3 mL Intravenous Q12H  . traZODone  100 mg Oral QHS  . vitamin B-12  1,000 mcg Oral Daily   Continuous Infusions: . 0.45 % NaCl with KCl 20 mEq / L 75 mL/hr at 01/22/20 0750  . cefTRIAXone (ROCEPHIN)  IV 2 g (01/21/20 2149)  . vancomycin 1,750 mg (01/22/20 0104)     LOS: 4 days   Time spent: 25 minutes.  Patrecia Pour, MD [facility] [email or web address] 01/22/2020, 12:59 PM

## 2020-01-22 NOTE — OR Nursing (Signed)
Pt reports he can not breath lying flat, he reports sinus drainage as cause of choking when lying less than 90 degrees. Raul Del notified.

## 2020-01-22 NOTE — Progress Notes (Signed)
Surgery cancelled for 01/23/20, due to angiogram being delayed until next week due to patient compliance issues.  Plan for surgery on Wednesday after the CTA is performed as patient is still stable.  Will see tomorrow and discuss

## 2020-01-22 NOTE — Progress Notes (Signed)
OT Cancellation Note  Patient Details Name: Alexander Alvarado MRN: [id number] DOB: 1941/04/10   Cancelled Treatment:    Reason Eval/Treat Not Completed: Patient at procedure or test/ unavailable Pt not in room upon OT arrival - pt currently off floor for procedure. OT will follow up and continue POC as available.  Dessie Coma, M.S. OTR/L  01/22/20, 11:03 AM

## 2020-01-22 NOTE — Anesthesia Preprocedure Evaluation (Deleted)
Anesthesia Evaluation    Airway        Dental   Pulmonary Current Smoker and Patient abstained from smoking.,           Cardiovascular hypertension,      Neuro/Psych    GI/Hepatic   Endo/Other    Renal/GU      Musculoskeletal   Abdominal   Peds  Hematology   Anesthesia Other Findings Past Medical History: No date: Depression No date: GERD (gastroesophageal reflux disease) No date: High cholesterol No date: Hypertension No date: PVD (peripheral vascular disease) ([place]) No date: Stroke [facility])     Comment:  left side weakness   Reproductive/Obstetrics                             Anesthesia Physical Anesthesia Plan Anesthesia Quick Evaluation

## 2020-01-22 NOTE — Progress Notes (Signed)
Physical Therapy Treatment Patient Details Name: Alexander Alvarado MRN: [id number] DOB: 16-Jun-1941 Today's Date: 01/22/2020    History of Present Illness YOUNUS GUARNIERI is a 79 y.o. male who presents with multiple medical issues.  Patient arrived via EMS due to left-sided weakness and concern for potential CVA.  Patient was also noted to have a ulceration to the dorsal aspect of the left third toe with bone exposed as well as posterior aspect left heel.  Patient notes symptoms have been present for for about 2 months.  States they have progressively worsened over the last 2 months, becoming larger in the size and foul-smelling. Pt being followed by vascular surgery with plan for potential surgical intervention later in the week.  PMH includes CVA with residual L sided weakness, PVD, HTN, HLD, R hip hemiarthroplastly.    PT Comments    Pt initially did not want to work with PT secondary to being "tired" but was later agreeable to participating with some in-bed exercise and bed positioning for comfort. Pt found with supplemental oxygen off with a SpO2 in the low-mid 80's, oxygen reapplied and SpO2 returned to the high 80's/low 90's. Pt deferred sitting up EOB and requested to stay lying in the bed.  Pt will benefit from PT services in a SNF setting upon discharge to safely address deficits listed in patient problem list for decreased caregiver assistance and eventual return to PLOF.    Follow Up Recommendations  SNF     Equipment Recommendations  Rolling walker with 5" wheels    Recommendations for Other Services       Precautions / Restrictions Precautions Precautions: Fall Restrictions Weight Bearing Restrictions: Yes LLE Weight Bearing: Non weight bearing    Mobility  Bed Mobility               General bed mobility comments: Pt declined bed mobility this afternoon secondary to feeling "tired"  Transfers                    Ambulation/Gait                  Stairs             Wheelchair Mobility    Modified Rankin (Stroke Patients Only)       Balance                                            Cognition Arousal/Alertness: Awake/alert Behavior During Therapy: WFL for tasks assessed/performed;Flat affect Overall Cognitive Status: Within Functional Limits for tasks assessed                                        Exercises Total Joint Exercises Ankle Circles/Pumps: AROM;Strengthening;Both;10 reps Quad Sets: Strengthening;Both;10 reps Gluteal Sets: Strengthening;Both;10 reps Hip ABduction/ADduction: AROM;Strengthening;Both;10 reps Straight Leg Raises: AROM;Strengthening;Both;10 reps Other Exercises Other Exercises: Prevalon boot application, pillow positioning for comfort    General Comments        Pertinent Vitals/Pain Pain Assessment: 0-10 Pain Score: 8  Pain Location: L heel Pain Descriptors / Indicators: Burning Pain Intervention(s): Monitored during session    Home Living                      Prior Function  PT Goals (current goals can now be found in the care plan section) Progress towards PT goals: Progressing toward goals    Frequency    Min 2X/week      PT Plan Current plan remains appropriate    Co-evaluation              AM-PAC PT "6 Clicks" Mobility   Outcome Measure  Help needed turning from your back to your side while in a flat bed without using bedrails?: A Little Help needed moving from lying on your back to sitting on the side of a flat bed without using bedrails?: A Little Help needed moving to and from a bed to a chair (including a wheelchair)?: A Little Help needed standing up from a chair using your arms (e.g., wheelchair or bedside chair)?: A Little Help needed to walk in hospital room?: Total Help needed climbing 3-5 steps with a railing? : Total 6 Click Score: 14    End of Session Equipment Utilized During  Treatment: Oxygen Activity Tolerance: Patient tolerated treatment well Patient left: in bed;with call bell/phone within reach;with bed alarm set;Other (comment)(Prevalon boots donned to B feet) Nurse Communication: Mobility status PT Visit Diagnosis: Unsteadiness on feet (R26.81);History of falling (Z91.81);Difficulty in walking, not elsewhere classified (R26.2);Muscle weakness (generalized) (M62.81);Pain Pain - Right/Left: Left Pain - part of body: Ankle and joints of foot     Time: 1515-1530 PT Time Calculation (min) (ACUTE ONLY): 15 min  Charges:                        Annabelle Harman, SPT 01/22/20 3:51 PM

## 2020-01-22 NOTE — Progress Notes (Signed)
[facility] Daily Progress Note  Communication Note  Patient refusing angiogram this AM. Patient refusing to lay flat, was discussed with him multiple times during week and in presence of bedside nurse yesterday. Patient states his "sinus drain when he lays flat and cant breathe". Will reschedule until next week and do under general anesthesia (Tuesday with Dr. Delana Meyer).   Marcelle Overlie PA-C 01/22/2020 12:28 PM

## 2020-01-22 NOTE — Progress Notes (Signed)
Patients o2 was 84% room air started on 2l and o2 came up to 90%

## 2020-01-22 NOTE — Consult Note (Signed)
NAME: Alexander Alvarado  DOB: 02-Mar-1941  MRN: [id number]  Date/Time: 01/22/2020 1:18 PM  REQUESTING PROVIDER: Dr.Grunz Subjective:  REASON FOR CONSULT: left foot infection ? Alexander Alvarado is a 79 y.o. male with a history of PVD, HTN, CVA was admitted on 01/18/20 with weakness on the left side of the body which he noted when he woke up that morning. He had workup including MRI and neuro eval. He has had a foot wound for many months- Pt had a fracture of the rt femur following a fall in NOV 2020 and underwent rt hemiarthroplasty on 09/11/19. [facility] and says he was in bed most of the time leading to a left heel ulcer. Pt has had 2nd toe amputation in 2016 and has been followed by Dr.Fowler- He has PVD.  He last saw Dr.Fowler in Feb 2021 Was noted to have both heel ulcerations . He had debridement in the office and he was asked to follow up in 2 weeks but he did not show up.  During this hospitalization he has been started on VAnco/cefriaoxne and flagyl- culture was sent . Was seen by Dr.Baker Awaiting angio by vascular They took him today but he could not lay flat and it had to be postponed for Monday I am seeing for antibiotic recommendation Pt is a current smoker   Past Medical History:  Diagnosis Date  . Depression   . GERD (gastroesophageal reflux disease)   . High cholesterol   . Hypertension   . PVD (peripheral vascular disease) ([place])   . Stroke [facility])    left side weakness    Past Surgical History:  Procedure Laterality Date  . AMPUTATION Left 08/31/2015   Procedure: AMPUTATION DIGIT left 2nd toe ;  Surgeon: Samara Deist, DPM;  Location: [facility];  Service: Podiatry;  Laterality: Left;  . AMPUTATION TOE Left 08/2015   second toe  . CHOLECYSTECTOMY    . HIP ARTHROPLASTY Right 09/11/2019   Procedure: ARTHROPLASTY BIPOLAR HIP (HEMIARTHROPLASTY);  Surgeon: Leim Fabry, MD;  Location: [facility];  Service: Orthopedics;  Laterality:  Right;  . LUMBAR DISC SURGERY    . VASCULAR SURGERY Left 2011   x 5 to left leg    Social History   Socioeconomic History  . Marital status: Single    Spouse name: Not on file  . Number of children: Not on file  . Years of education: Not on file  . Highest education level: Not on file  Occupational History  . Not on file  Tobacco Use  . Smoking status: Current Every Day Smoker    Packs/day: 0.50    Types: Cigarettes  . Smokeless tobacco: Never Used  . Tobacco comment: 3-4 ciggs daily  Substance and Sexual Activity  . Alcohol use: No    Alcohol/week: 0.0 standard drinks  . Drug use: No  . Sexual activity: Not Currently  Other Topics Concern  . Not on file  Social History Narrative  . Not on file   Social Determinants of Health   Financial Resource Strain:   . Difficulty of Paying Living Expenses:   Food Insecurity:   . Worried About Charity fundraiser in the Last Year:   . Arboriculturist in the Last Year:   Transportation Needs:   . Film/video editor (Medical):   Marland Kitchen Lack of Transportation (Non-Medical):   Physical Activity:   . Days of Exercise per Week:   . Minutes of Exercise  per Session:   Stress:   . Feeling of Stress :   Social Connections:   . Frequency of Communication with Friends and Family:   . Frequency of Social Gatherings with Friends and Family:   . Attends Religious Services:   . Active Member of Clubs or Organizations:   . Attends Archivist Meetings:   Marland Kitchen Marital Status:   Intimate Partner Violence:   . Fear of Current or Ex-Partner:   . Emotionally Abused:   Marland Kitchen Physically Abused:   . Sexually Abused:     Family History  Problem Relation Age of Onset  . CAD Mother    No Known Allergies   ? Current Facility-Administered Medications  Medication Dose Route Frequency Provider Last Rate Last Admin  . 0.45 % NaCl with KCl 20 mEq / L infusion   Intravenous Continuous Patrecia Pour, MD 75 mL/hr at 01/22/20 0750 New Bag at  01/22/20 0750  . acetaminophen (TYLENOL) tablet 650 mg  650 mg Oral Q6H PRN Lorella Nimrod, MD   650 mg at 01/21/20 1444   Or  . acetaminophen (TYLENOL) suppository 650 mg  650 mg Rectal Q6H PRN Lorella Nimrod, MD      . amLODipine (NORVASC) tablet 10 mg  10 mg Oral Daily Lorella Nimrod, MD   10 mg at 01/21/20 0842  . aspirin EC tablet 81 mg  81 mg Oral Daily Lorella Nimrod, MD   81 mg at 01/21/20 0843  . atorvastatin (LIPITOR) tablet 20 mg  20 mg Oral QHS Lorella Nimrod, MD   20 mg at 01/21/20 2132  . cefTRIAXone (ROCEPHIN) 2 g in sodium chloride 0.9 % 100 mL IVPB  2 g Intravenous Q24H Lorella Nimrod, MD 200 mL/hr at 01/21/20 2149 2 g at 01/21/20 2149  . chlorhexidine (HIBICLENS) 4 % liquid 4 application  60 mL Topical Once Caroline More, DPM      . feeding supplement (ENSURE ENLIVE) (ENSURE ENLIVE) liquid 237 mL  237 mL Oral TID BM Lorella Nimrod, MD   237 mL at 01/21/20 2208  . feeding supplement (ENSURE PRE-SURGERY) liquid 296 mL  296 mL Oral Once Caroline More, DPM      . folic acid (FOLVITE) tablet 1 mg  1 mg Oral Daily Lorella Nimrod, MD   1 mg at 01/21/20 0843  . gabapentin (NEURONTIN) capsule 100 mg  100 mg Oral TID Patrecia Pour, MD   100 mg at 01/21/20 2132  . HYDROmorphone (DILAUDID) injection 1 mg  1 mg Intravenous Once PRN Stegmayer, Kimberly A, PA-C      . ketorolac (TORADOL) 15 MG/ML injection 15 mg  15 mg Intravenous Q6H PRN Lorella Nimrod, MD   15 mg at 01/21/20 1446  . melatonin tablet 5 mg  5 mg Oral QHS Lorella Nimrod, MD   5 mg at 01/21/20 2132  . metroNIDAZOLE (FLAGYL) tablet 500 mg  500 mg Oral Q8H Lorella Nimrod, MD   500 mg at 01/21/20 2132  . multivitamin-lutein (OCUVITE-LUTEIN) capsule 1 capsule  1 capsule Oral Daily Lorella Nimrod, MD   1 capsule at 01/21/20 0843  . ondansetron (ZOFRAN) tablet 4 mg  4 mg Oral Q6H PRN Lorella Nimrod, MD       Or  . ondansetron (ZOFRAN) injection 4 mg  4 mg Intravenous Q6H PRN Lorella Nimrod, MD      . ondansetron (ZOFRAN) injection 4 mg  4 mg  Intravenous Q6H PRN Stegmayer, Kimberly A, PA-C      . pantoprazole (  PROTONIX) EC tablet 40 mg  40 mg Oral Daily Lorella Nimrod, MD   40 mg at 01/21/20 0843  . polyethylene glycol (MIRALAX / GLYCOLAX) packet 17 g  17 g Oral Daily PRN Lorella Nimrod, MD   17 g at 01/20/20 1359  . QUEtiapine (SEROQUEL) tablet 25 mg  25 mg Oral QHS PRN Lang Snow, NP      . sodium chloride flush (NS) 0.9 % injection 3 mL  3 mL Intravenous Q12H Lorella Nimrod, MD   3 mL at 01/22/20 0856  . traMADol (ULTRAM) tablet 50 mg  50 mg Oral Q12H PRN Patrecia Pour, MD   50 mg at 01/21/20 1655  . traZODone (DESYREL) tablet 100 mg  100 mg Oral QHS Patrecia Pour, MD   100 mg at 01/21/20 2134  . traZODone (DESYREL) tablet 50 mg  50 mg Oral QHS PRN Lang Snow, NP   50 mg at 01/21/20 0122  . vancomycin (VANCOREADY) IVPB 1750 mg/350 mL  1,750 mg Intravenous Q24H Lorella Nimrod, MD 175 mL/hr at 01/22/20 0104 1,750 mg at 01/22/20 0104  . vitamin B-12 (CYANOCOBALAMIN) tablet 1,000 mcg  1,000 mcg Oral Daily Lorella Nimrod, MD   1,000 mcg at 01/21/20 [id number]     Abtx:  Anti-infectives (From admission, onward)   Start     Dose/Rate Route Frequency Ordered Stop   01/22/20 1012  ceFAZolin (ANCEF) IVPB 2g/100 mL premix  Status:  Discontinued    Note to Pharmacy: To be given in specials   2 g 200 mL/hr over 30 Minutes Intravenous 30 min pre-op 01/22/20 1010 01/22/20 1220   01/19/20 0100  vancomycin (VANCOREADY) IVPB 1750 mg/350 mL     1,750 mg 175 mL/hr over 120 Minutes Intravenous Every 24 hours 01/18/20 1930     01/18/20 2200  metroNIDAZOLE (FLAGYL) tablet 500 mg     500 mg Oral Every 8 hours 01/18/20 1824     01/18/20 2000  cefTRIAXone (ROCEPHIN) 2 g in sodium chloride 0.9 % 100 mL IVPB     2 g 200 mL/hr over 30 Minutes Intravenous Every 24 hours 01/18/20 1824     01/18/20 1630  vancomycin (VANCOCIN) IVPB 1000 mg/200 mL premix     1,000 mg 200 mL/hr over 60 Minutes Intravenous  Once 01/18/20 1626 01/18/20 1942    01/18/20 1630  piperacillin-tazobactam (ZOSYN) IVPB 3.375 g     3.375 g 100 mL/hr over 30 Minutes Intravenous  Once 01/18/20 1626 01/18/20 1838      REVIEW OF SYSTEMS:  Const: negative fever, negative chills,  Eyes: negative diplopia or visual changes, negative eye pain ENT: negative coryza, negative sore throat Resp: negative cough, hemoptysis, dyspnea Cards: negative for chest pain, palpitations, lower extremity edema GU: negative for frequency, dysuria and hematuria GI: Negative for abdominal pain, diarrhea, bleeding, constipation Skin: negative for rash and pruritus Heme: negative for easy bruising and gum/nose bleeding [id number] weakness Neurolo:left sided weakness  Psych: negative for feelings of anxiety, depression  Endocrine: negative for thyroid, diabetes Allergy/Immunology- negative for any medication or food allergies ?  Objective:  VITALS:  BP 137/80   Pulse 83   Temp 98.7 F (37.1 C) (Axillary)   Resp 17   Ht 5\' 10"  (1.778 m)   Wt 73.8 kg Comment: bed scale  SpO2 92%   BMI 23.34 kg/m  PHYSICAL EXAM:  General: Alert, cooperative, no distress, appears stated age.  Head: Normocephalic, without obvious abnormality, atraumatic. Eyes: Conjunctivae clear, anicteric sclerae. Pupils are  equal ENT Nares normal. No drainage or sinus tenderness. Lips, mucosa, and tongue normal. No Thrush Neck: Supple, symmetrical, no adenopathy, thyroid: non tender no carotid bruit and no JVD. Back: No CVA tenderness. Lungs: Clear to auscultation bilaterally. No Wheezing or Rhonchi. No rales. Heart: Regular rate and rhythm, no murmur, rub or gallop. Abdomen: Soft, non-tender,not distended. Bowel sounds normal. No masses Extremities: left heel ulcer Left third toe- ischemic ulceration /3rd toe       Skin: No rashes or lesions. Or bruising Lymph: Cervical, supraclavicular normal. Neurologic: Grossly non-focal Pertinent Labs Lab Results CBC    Component Value Date/Time    WBC 11.0 (H) 01/22/2020 0500   RBC 5.18 01/22/2020 0500   HGB 15.8 01/22/2020 0500   HGB 20.6 (HH) 02/10/2019 0852   HCT 48.7 01/22/2020 0500   HCT 59.9 (H) 02/10/2019 0852   PLT 263 01/22/2020 0500   PLT 189 02/10/2019 0852   MCV 94.0 01/22/2020 0500   MCV 94 02/10/2019 0852   MCV 90 07/15/2012 0447   MCH 30.5 01/22/2020 0500   MCHC 32.4 01/22/2020 0500   RDW 13.6 01/22/2020 0500   RDW 15.8 (H) 02/10/2019 0852   RDW 13.2 07/15/2012 0447   LYMPHSABS 2.4 01/18/2020 1246   LYMPHSABS 2.6 02/10/2019 0852   LYMPHSABS 3.3 07/15/2012 0447   MONOABS 0.9 01/18/2020 1246   MONOABS 1.0 07/15/2012 0447   EOSABS 0.0 01/18/2020 1246   EOSABS 0.0 02/10/2019 0852   EOSABS 0.1 07/15/2012 0447   BASOSABS 0.1 01/18/2020 1246   BASOSABS 0.1 02/10/2019 0852   BASOSABS 0.0 07/15/2012 0447    CMP Latest Ref Rng & Units 01/22/2020 01/21/2020 01/19/2020  Glucose 70 - 99 mg/dL 121(H) - 90  BUN 8 - 23 mg/dL 12 - 12  Creatinine 0.61 - 1.24 mg/dL 0.65 0.66 0.69  Sodium 135 - 145 mmol/L 139 - 138  Potassium 3.5 - 5.1 mmol/L 3.2(L) - 3.9  Chloride 98 - 111 mmol/L 102 - 101  CO2 22 - 32 mmol/L 27 - 27  Calcium 8.9 - 10.3 mg/dL 8.8(L) - 8.9  Total Protein 6.5 - 8.1 g/dL 6.0(L) - -  Total Bilirubin 0.3 - 1.2 mg/dL 0.6 - -  Alkaline Phos 38 - 126 U/L 82 - -  AST 15 - 41 U/L 17 - -  ALT 0 - 44 U/L 12 - -      Microbiology: Recent Results (from the past 240 hour(s))  SARS CORONAVIRUS 2 (TAT 6-24 HRS) Nasopharyngeal Nasopharyngeal Swab     Status: None   Collection Time: 01/18/20  4:26 PM   Specimen: Nasopharyngeal Swab  Result Value Ref Range Status   SARS Coronavirus 2 NEGATIVE NEGATIVE Final    Comment: (NOTE) SARS-CoV-2 target nucleic acids are NOT DETECTED. The SARS-CoV-2 RNA is generally detectable in upper and lower respiratory specimens during the acute phase of infection. Negative results do not preclude SARS-CoV-2 infection, do not rule out co-infections with other pathogens, and should  not be used as the sole basis for treatment or other patient management decisions. Negative results must be combined with clinical observations, patient history, and epidemiological information. The expected result is Negative. Fact Sheet for Patients: SugarRoll.be Fact Sheet for Healthcare Providers: https://www.woods-mathews.com/ This test is not yet approved or cleared by the [place] FDA and  has been authorized for detection and/or diagnosis of SARS-CoV-2 by FDA under an Emergency Use Authorization (EUA). This EUA will remain  in effect (meaning this test can be used) for the duration  of the COVID-19 declaration under Section 56 4(b)(1) of the Act, 21 U.S.C. section 360bbb-3(b)(1), unless the authorization is terminated or revoked sooner. Performed at [facility], [place] [address] [place], [place] [address]   Wound or Superficial Culture     Status: None   Collection Time: 01/18/20  4:53 PM   Specimen: Wound  Result Value Ref Range Status   Specimen Description   Final    WOUND Performed at [facility], [address] [place], [place] [address]    Special Requests   Final    LEFT 3RD TOE Performed at [facility], [place], [place] [address]    Gram Stain   Final    NO WBC SEEN MODERATE GRAM POSITIVE COCCI MODERATE GRAM POSITIVE RODS RARE GRAM NEGATIVE RODS    Culture   Final    FEW METHICILLIN RESISTANT STAPHYLOCOCCUS AUREUS WITHIN MIXED ORGANISMS Performed at [facility], [address] [place], [place] [address]    Report Status 01/21/2020 FINAL  Final   Organism ID, Bacteria METHICILLIN RESISTANT STAPHYLOCOCCUS AUREUS  Final      Susceptibility   Methicillin resistant staphylococcus aureus - MIC*    CIPROFLOXACIN >=8 RESISTANT Resistant     ERYTHROMYCIN >=8 RESISTANT Resistant     GENTAMICIN <=0.5 SENSITIVE Sensitive     OXACILLIN >=4 RESISTANT Resistant      TETRACYCLINE <=1 SENSITIVE Sensitive     VANCOMYCIN <=0.5 SENSITIVE Sensitive     TRIMETH/SULFA <=10 SENSITIVE Sensitive     CLINDAMYCIN <=0.25 SENSITIVE Sensitive     RIFAMPIN <=0.5 SENSITIVE Sensitive     Inducible Clindamycin NEGATIVE Sensitive     * FEW METHICILLIN RESISTANT STAPHYLOCOCCUS AUREUS  Wound or Superficial Culture     Status: None   Collection Time: 01/18/20  4:53 PM   Specimen: Wound  Result Value Ref Range Status   Specimen Description   Final    WOUND Performed at [facility], [address] [place], [place] [address]    Special Requests   Final    LEFT HEEL Performed at [facility], [address] [place], [place] [address]    Gram Stain   Final    NO WBC SEEN RARE GRAM POSITIVE COCCI Performed at [facility], [place] [address] [place], [place] [address]    Culture FEW METHICILLIN RESISTANT STAPHYLOCOCCUS AUREUS  Final   Report Status 01/21/2020 FINAL  Final   Organism ID, Bacteria METHICILLIN RESISTANT STAPHYLOCOCCUS AUREUS  Final      Susceptibility   Methicillin resistant staphylococcus aureus - MIC*    CIPROFLOXACIN >=8 RESISTANT Resistant     ERYTHROMYCIN >=8 RESISTANT Resistant     GENTAMICIN <=0.5 SENSITIVE Sensitive     OXACILLIN >=4 RESISTANT Resistant     TETRACYCLINE <=1 SENSITIVE Sensitive     VANCOMYCIN 1 SENSITIVE Sensitive     TRIMETH/SULFA <=10 SENSITIVE Sensitive     CLINDAMYCIN <=0.25 SENSITIVE Sensitive     RIFAMPIN <=0.5 SENSITIVE Sensitive     Inducible Clindamycin NEGATIVE Sensitive     * FEW METHICILLIN RESISTANT STAPHYLOCOCCUS AUREUS    IMAGING RESULTS: Xray foot Cortical irregularity involving the dorsal cortex of the third digit proximal phalanx raising the suspicion for osteomyelitis in the setting of dorsal skin wound  I have personally reviewed the films ? Impression/Recommendation  Left sided weakness- much improved Ct head old lacunar infarcts ? ? PAD with left 3d toe ischemic ulcer  and osteomyelitis  heel pressure ulcer- superficial Awaiting angio and amputation of the third toe  MRSA infection of the toe and heel- No evidence of systemic infection Continue vanco, Dc ceftriaxone and continue PO flagyl  After amputation he will likely not need IV antibiotics and can be switched to PO  And local heel care . Discussed with patient ID will sign off- call if needed

## 2020-01-22 NOTE — OR Nursing (Signed)
Pt returned to inpt room due to inability to do procedure since pt cant lie flat.

## 2020-01-22 NOTE — Progress Notes (Signed)
Pharmacy Antibiotic Note  Alexander Alvarado is a 79 y.o. male admitted on 01/18/2020. Concern for diabetic foot infection. Pharmacy has been consulted for vancomycin dosing.  Scr (mg/dL): 0.61 > 0.69  - Vascular surgery consulted, planned angiogram for 4/2.  - Podiatry planning left heel debridement and left 3rd toe amputations with possible taking metatarsal head 4/3.  - Per MD's note, patient would benefit from intraoperative cultures. Therefore, will continue broad spectrum antibiotics.  Plan: Continue Vancomycin 1750 mg IV q24h. Will order serum creatinine for tomorrow.  AUC goal 400-550 Expected AUC 498.4   Will order Vancomycin levels:  --- 4/3 @ 0400 [facility] --- 4/4 @ 0030 trough    Height: 5\' 10"  (177.8 cm) Weight: 73.8 kg (162 lb 11.2 oz)(bed scale) IBW/kg (Calculated) : 73  Temp (24hrs), Avg:98.2 F (36.8 C), Min:97.9 F (36.6 C), Max:98.7 F (37.1 C)  Recent Labs  Lab 01/18/20 1246 01/19/20 0513 01/21/20 0500 01/22/20 0500  WBC 14.1* 11.1*  --  11.0*  CREATININE 0.61 0.69 0.66 0.65  LATICACIDVEN 1.9  --   --   --     Estimated Creatinine Clearance: 81.1 mL/min (by C-G formula based on SCr of 0.65 mg/dL).    No Known Allergies  Antimicrobials this admission: Ceftriaxone 3/29 >> Vancomycin 3/29 >> Metronidazole 3/30 >> Zosyn 3/29 >> 3/29  Microbiology results: 3/29 Wound Cx: moderate GPC/GPR; rare GNR; MRSA + Mixed organisms    Thank you for allowing pharmacy to be a part of this patient's care.  Rowland Lathe, PharmD 01/22/2020 10:16 AM

## 2020-01-23 ENCOUNTER — Encounter
Admission: EM | Disposition: A | Discharge status: Skilled Nursing Facility | Payer: Self-pay | Source: Home / Self Care | Attending: Family Medicine

## 2020-01-23 LAB — BASIC METABOLIC PANEL
Anion gap: 8 (ref 5–15)
BUN: 11 mg/dL (ref 8–23)
CO2: 27 mmol/L (ref 22–32)
Calcium: 8.5 mg/dL — ABNORMAL LOW (ref 8.9–10.3)
Chloride: 102 mmol/L (ref 98–111)
Creatinine, Ser: 0.62 mg/dL (ref 0.61–1.24)
GFR calc Af Amer: >60 mL/min (ref >60)
GFR calc non Af Amer: >60 mL/min (ref >60)
Glucose, Bld: 91 mg/dL (ref 70–99)
Potassium: 3.7 mmol/L (ref 3.5–5.1)
Sodium: 137 mmol/L (ref 135–145)

## 2020-01-23 LAB — VANCOMYCIN, PEAK: Vancomycin Pk: 39 ug/mL (ref 30–40)

## 2020-01-23 LAB — BRAIN NATRIURETIC PEPTIDE: B Natriuretic Peptide: 32 pg/mL (ref 0.0–100.0)

## 2020-01-23 SURGERY — AMPUTATION, FOOT, RAY
Anesthesia: Choice | Laterality: Left

## 2020-01-23 MED ORDER — ENOXAPARIN SODIUM 40 MG/0.4ML ~~LOC~~ SOLN
40.0000 mg | SUBCUTANEOUS | Status: AC
  Administered 2020-01-23 – 2020-01-25 (×3): 40 mg via SUBCUTANEOUS
  Filled 2020-01-23 (×3): qty 0.4

## 2020-01-23 MED ORDER — QUETIAPINE FUMARATE 25 MG PO TABS
25.0000 mg | ORAL_TABLET | Freq: Once | ORAL | Status: AC
  Administered 2020-01-23: 25 mg via ORAL
  Filled 2020-01-23: qty 1

## 2020-01-23 NOTE — Progress Notes (Signed)
PODIATRY / FOOT AND ANKLE SURGERY PROGRESS NOTE  Requesting Physician: Lorella Nimrod MD  Reason for consult: L foot wound  Chief Complaint: L 3rd toe and heel wound   HPI: Alexander Alvarado is a 79 y.o. male resting in bed comfortably. Patient was unable to have his CTA with intervention yesterday due to not being able to lay down on the table.  Vascular is planning procedure on Tuesday next week under general anesthesia.  Pt complains of 8/10 L heel pain in the area of the ulceration.  Pt denies any other complaints.  Pt denies n/v/f/c.  PMHx:  Past Medical History:  Diagnosis Date  . Depression   . GERD (gastroesophageal reflux disease)   . High cholesterol   . Hypertension   . PVD (peripheral vascular disease) ([place])   . Stroke [facility])    left side weakness    Surgical Hx:  Past Surgical History:  Procedure Laterality Date  . AMPUTATION Left 08/31/2015   Procedure: AMPUTATION DIGIT left 2nd toe ;  Surgeon: Samara Deist, DPM;  Location: [place];  Service: Podiatry;  Laterality: Left;  . AMPUTATION TOE Left 08/2015   second toe  . CHOLECYSTECTOMY    . HIP ARTHROPLASTY Right 09/11/2019   Procedure: ARTHROPLASTY BIPOLAR HIP (HEMIARTHROPLASTY);  Surgeon: Leim Fabry, MD;  Location: [facility];  Service: Orthopedics;  Laterality: Right;  . LUMBAR DISC SURGERY    . VASCULAR SURGERY Left 2011   x 5 to left leg    FHx:  Family History  Problem Relation Age of Onset  . CAD Mother     Social History:  reports that he has been smoking cigarettes. He has been smoking about 0.50 packs per day. He has never used smokeless tobacco. He reports that he does not drink alcohol or use drugs.  Allergies: No Known Allergies  Review of Systems: General ROS: negative Respiratory ROS: no cough, shortness of breath, or wheezing Cardiovascular ROS: no chest pain or dyspnea on exertion Gastrointestinal ROS: no abdominal pain, change in bowel habits, or black or bloody  stools Musculoskeletal ROS: positive for - muscular weakness and pain in foot - left Neurological ROS: positive for - numbness/tingling and weakness Dermatological ROS: positive for dry skin, hair changes, nail changes and Left heel and third toe wound  Medications Prior to Admission  Medication Sig Dispense Refill  . acetaminophen (TYLENOL) 325 MG tablet Take 2 tablets (650 mg total) by mouth every 6 (six) hours as needed for mild pain. 100 tablet 2  . amLODipine (NORVASC) 10 MG tablet Take 1 tablet (10 mg total) by mouth daily. 90 tablet 1  . aspirin 81 MG tablet Take 81 mg by mouth daily.     Marland Kitchen atorvastatin (LIPITOR) 20 MG tablet TAKE 1 TABLET BY MOUTH AT BEDTIME 90 tablet 1  . cyanocobalamin 1000 MCG tablet Take 1,000 mcg by mouth daily.     . folic acid (FOLVITE) 1 MG tablet Take 1 mg by mouth daily.     . hydrALAZINE (APRESOLINE) 25 MG tablet Take 1 tablet (25 mg total) by mouth 2 (two) times daily. 180 tablet 3  . metoprolol tartrate (LOPRESSOR) 25 MG tablet Take 1 tablet (25 mg total) by mouth 2 (two) times daily. 180 tablet 3  . omeprazole (PRILOSEC) 40 MG capsule Take 1 capsule (40 mg total) by mouth daily. 90 capsule 3  . quinapril (ACCUPRIL) 40 MG tablet Take 1 tablet (40 mg total) by mouth daily. 90 tablet 1  .  oxyCODONE (OXY IR/ROXICODONE) 5 MG immediate release tablet Take 1 tablet (5 mg total) by mouth every 3 (three) hours as needed for moderate pain (pain score 4-6). (Patient not taking: Reported on 01/18/2020) 30 tablet 0    Physical Exam: General: Alert and oriented.  No apparent distress.  Vascular: DP/PT pulses nonpalpable bilateral.  Capillary fill time does appear to be intact to all remaining digits of both feet.  Mild nonpitting edema to bilateral lower extremities with rubor with dependency of the left lower extremity that is reduced with elevation.  No hair growth present to bilateral lower extremities.  Skin appears to be warm to touch overall to bilateral lower  extremities.  Neuro: Light touch sensation reduced to bilateral lower extremities left greater than right.  Derm: Ulceration present to the left PIPJ over the dorsal aspect of the toe with the PIPJ bone exposed as the head of the proximal phalanx appears to be exposed through the wound.  The bone appears to be hard and intact with no pain on palpation, dark necrotic changes seen over the wound consistent with dry gangrene, no drainage, mild odor, minimal periwound erythema, mild edema.  Wound measures approximately 1.5 cm x 1 cm x 0.4 cm to bone  Ulceration present to the posterior lateral aspect the left heel that appears to go to subcutaneous depth only, mild hyperkeratotic border around the periphery of the wound, mild serous drainage, no erythema, mild edema, minimal odor.  Wound measures approximately 2 cm x 1.5 x 0.2 cm.  Nails x9 appear to be thickened, elongated, dystrophic and brittle with subungual debris.  Notable dirt and grime to the plantar aspects of both feet, not hygienic.  MSK: Pain on palpation to the posterior aspect the left heel around the ulceration site.  Left second toe partial amputation.  Results for orders placed or performed during the hospital encounter of 01/18/20 (from the past 48 hour(s))  Glucose, capillary     Status: Abnormal   Collection Time: 01/21/20  5:34 PM  Result Value Ref Range   Glucose-Capillary 136 (H) 70 - 99 mg/dL    Comment: Glucose reference range applies only to samples taken after fasting for at least 8 hours.  Glucose, capillary     Status: Abnormal   Collection Time: 01/21/20  9:29 PM  Result Value Ref Range   Glucose-Capillary 147 (H) 70 - 99 mg/dL    Comment: Glucose reference range applies only to samples taken after fasting for at least 8 hours.  CBC     Status: Abnormal   Collection Time: 01/22/20  5:00 AM  Result Value Ref Range   WBC 11.0 (H) 4.0 - 10.5 K/uL   RBC 5.18 4.22 - 5.81 MIL/uL   Hemoglobin 15.8 13.0 - 17.0 g/dL    HCT 48.7 39.0 - 52.0 %   MCV 94.0 80.0 - 100.0 fL   MCH 30.5 26.0 - 34.0 pg   MCHC 32.4 30.0 - 36.0 g/dL   RDW 13.6 11.5 - 15.5 %   Platelets 263 150 - 400 K/uL   nRBC 0.0 0.0 - 0.2 %    Comment: Performed at [facility], [address] [place], [place] [address]  Comprehensive metabolic panel     Status: Abnormal   Collection Time: 01/22/20  5:00 AM  Result Value Ref Range   Sodium 139 135 - 145 mmol/L   Potassium 3.2 (L) 3.5 - 5.1 mmol/L   Chloride 102 98 - 111 mmol/L   CO2 27  22 - 32 mmol/L   Glucose, Bld 121 (H) 70 - 99 mg/dL    Comment: Glucose reference range applies only to samples taken after fasting for at least 8 hours.   BUN 12 8 - 23 mg/dL   Creatinine, Ser 0.65 0.61 - 1.24 mg/dL   Calcium 8.8 (L) 8.9 - 10.3 mg/dL   Total Protein 6.0 (L) 6.5 - 8.1 g/dL   Albumin 2.9 (L) 3.5 - 5.0 g/dL   AST 17 15 - 41 U/L   ALT 12 0 - 44 U/L   Alkaline Phosphatase 82 38 - 126 U/L   Total Bilirubin 0.6 0.3 - 1.2 mg/dL   GFR calc non Af Amer >60 >60 mL/min   GFR calc Af Amer >60 >60 mL/min   Anion gap 10 5 - 15    Comment: Performed at [facility], [address] [place], [place] [address]  Magnesium     Status: None   Collection Time: 01/22/20  5:00 AM  Result Value Ref Range   Magnesium 1.8 1.7 - 2.4 mg/dL    Comment: Performed at [facility], [address] [place], [place] [address]  Vancomycin, peak     Status: None   Collection Time: 01/23/20  4:15 AM  Result Value Ref Range   Vancomycin Pk 39 30 - 40 ug/mL    Comment: Performed at [facility], [address] [place], [place] [address]  Brain natriuretic peptide     Status: None   Collection Time: 01/23/20  4:19 AM  Result Value Ref Range   B Natriuretic Peptide 32.0 0.0 - 100.0 pg/mL    Comment: Performed at [facility], [address] [place], [place] [id number]  Basic metabolic panel     Status: Abnormal   Collection Time: 01/23/20  4:19 AM  Result Value Ref  Range   Sodium 137 135 - 145 mmol/L   Potassium 3.7 3.5 - 5.1 mmol/L   Chloride 102 98 - 111 mmol/L   CO2 27 22 - 32 mmol/L   Glucose, Bld 91 70 - 99 mg/dL    Comment: Glucose reference range applies only to samples taken after fasting for at least 8 hours.   BUN 11 8 - 23 mg/dL   Creatinine, Ser 0.62 0.61 - 1.24 mg/dL   Calcium 8.5 (L) 8.9 - 10.3 mg/dL   GFR calc non Af Amer >60 >60 mL/min   GFR calc Af Amer >60 >60 mL/min   Anion gap 8 5 - 15    Comment: Performed at [facility], [address] [place], [place] [address]   DG Chest 2 View  Result Date: 01/22/2020 CLINICAL DATA:  Hypoxia, CHF, hypertension, smoker EXAM: CHEST - 2 VIEW COMPARISON:  09/11/2019 FINDINGS: Normal heart size, mediastinal contours, and pulmonary vascularity. Atherosclerotic calcification aorta. Chronic thickening of interstitial markings. No acute infiltrate, pleural effusion or pneumothorax. Bones demineralized. IMPRESSION: Chronic interstitial prominence. No acute abnormalities. Electronically Signed   By: Lavonia Dana M.D.   On: 01/22/2020 14:01    Blood pressure 138/68, pulse 80, temperature 98.3 F (36.8 C), temperature source Oral, resp. rate 16, height 5\' 10"  (1.778 m), weight 73.8 kg, SpO2 92 %.  Assessment 1. Osteomyelitis left third toe with chronic ulceration over the PIPJ of the dorsal aspect, dry gangrene 2. Left posterior heel subcutaneous ulceration 3. PVD 4. Neuropathy  Plan -Patient seen and examined -Continue dressing changes daily to the left foot as instructed. -Continue Prevalon boots at  all times when resting in bed or in chair. -Appreciate vascular recommendations.  Plan for intervention on Tuesday. -Appreciate medicine/ID recommendations for antibiotic therapy.  Culture results MRSA.  WBC 11, AFVSS. -Discussed with patient that he will need to have a left third toe total amputation with potential metatarsal head resection if the infection appears to be going proximal to  the proximal phalanx base.  Discussed all treatment options with the patient both conservative and surgical attempts at correction at this time patient is elected for amputation of the left third toe (partial third ray) and wound debridement to the left posterior heel.  All questions answered including postoperative course in detail.  Patient will have to remain partial weightbearing with heel contact only in a surgical shoe after procedure. -Stressed importance of prevalon boots for offloading.  Pt unwilling to wear. -Discussed with patient that procedure will need to take place after vascular intervention to improve blood flow status to left lower extremity to increase the odds of the amputation healing.  Patient understands. -Patient will be schedule for procedure on the morning of 01/27/20, pt to be NPO midnight prior to procedure.  Pending vascular plan.  Hold Lovenox 24h prior to surgery and restart 12h after.  Caroline More, DPM 01/23/2020, 12:39 PM

## 2020-01-23 NOTE — Plan of Care (Signed)

## 2020-01-23 NOTE — Progress Notes (Signed)
OT Cancellation Note  Patient Details Name: KASON BRASHEAR MRN: [id number] DOB: 01/14/1941   Cancelled Treatment:    Reason Eval/Treat Not Completed: Other (comment). MD in with pt to address wound care on foot. Will re-attempt OT tx at later date/time as pt is available.  Jeni Salles, MPH, MS, OTR/L ascom [phone] 01/23/20, 12:06 PM

## 2020-01-23 NOTE — Progress Notes (Signed)
PROGRESS NOTE  Alexander Alvarado  [id number] DOB: Aug 25, 1941 DOA: 01/18/2020 PCP: Glean Hess, MD   Brief Narrative: Alexander Pasion Hunteris a 79 y.o.malewith medical history significant ofhypertension, peripheral vascular diseases/pamputation of second toe and CVA  Brought to ED with concern of left-sided weakness which started this morning.Per patient he has some residual weakness on left side but when he got up this morning he was unable to move his left side.He normally walks with the help of walker since his prior stroke,he could not grasp a cup or walk .Hissymptoms started gradually improving as the day passes. He denies any facial droop, denies any change in his vision. He has left-sided drooping eyelid for a long time.  He was also complaining of worsening pain in his left foot. He has chronic wounds in his left third toe and heel. Apparently seen by in wound care nurse at home and getting treatment with no relief. He denies using any antibiotics or seen a physician for that. He has his second toe amputated due to osteo in the past. He denies any fever or chills. No upper respiratory symptoms. He lives alone and denies any sick contacts. He was also complaining of some increased urinary urgency and frequency. Denies any dysuria, hematuria or lower abdominal pain.  Assessment & Plan: Active Problems:   PAD (peripheral artery disease) (HCC)   Osteomyelitis of left foot (HCC)   Malnutrition of moderate degree  Left-sided weakness: Stable, more sensory component than motor on my exam. CT head initially without abnormality. LDL 42. - Pt declines MRI/MRA due to inability to lay flat due to sinus issues. Can consider repeat CT head in the future, though symptoms improved. - Continue PT, OT  Left 3rd toe osteomyelitis and left heel ulceration complicated by PAD: No clinical evidence of venous insufficiency. CRP 3.3 - Vascular surgery consulted, rescheduled angiogram  under anesthesia due to patient's inability to lay flat for 4/6. Pt has hx revascularization in the past. ABI consistent with L > R moderate PAD.  - Podiatry planning left heel debridement and left 3rd toe amputations with possible taking metatarsal head after revascularization.  - Continue vancomycin based on culture results with MRSA, continue po metronidazole per ID recommendations. Can likely switch to po abx after amputation. - CBGs at inpatient goal without insulin. HbA1c consistent with prediabetes at 5.8%. - Continue gabapentin for neuropathic component, prn tramadol. Continue tylenol, toradol.  Tobacco use: Long term.  - Cessation counseling provided, drawing line between his use and current vascular issues.   Acute hypoxic respiratory failure: Unclear etiology though patient has long history of tobacco use and has required supplemental oxygen in past admissions, had aspiration pneumonia.  - CXR reviewed personally showing interstitial prominence present on prior XR, no cardiomegaly or pulmonary edema. No evidence of aspiration. BNP checked (from report of pt intolerance of laying flat) is normal. Wean oxygen.  HTN:  - Restarted home antihypertensives.   Urinary urgency, frequency:  - Negative UA.  - Bladder scan prn.  Insomnia:  - Continue trazodone 150mg   RN Pressure Injury Documentation: Pressure Injury 01/20/20 Toe (Comment  which one) Anterior;Left Black dried wound (Active)  01/20/20 0830  Location: Toe (Comment  which one)  Location Orientation: Anterior;Left  Staging:   Wound Description (Comments): Black dried wound  Present on Admission:      Pressure Injury 01/20/20 Heel Left (Active)  01/20/20 0830  Location: Heel  Location Orientation: Left  Staging:   Wound Description (Comments):   Present  on Admission:    Moderate protein calorie malnutrition. Supplement protein as able to optimize wound healing.   DVT prophylaxis: Lovenox (hold on 4/6) Code Status:  DNR Family Communication: None at bedside Disposition Plan: Patient from home, will likely require SNF at discharge post operatively.   Consultants:   Podiatry, Dr. Luana Shu  Vascular surgery  Infectious diseases  Procedures:   None  Antimicrobials:  Vancomycin, ceftriaxone, flagyl  Subjective: Not eating >50% per record. Sleeping much better than before. Pain is severe in left heel, has taken off boots.   Objective: Vitals:   01/22/20 1040 01/22/20 1658 01/22/20 2309 01/23/20 0753  BP: 137/80 123/73 (!) 142/66 138/68  Pulse: 83 77 86 80  Resp: 17 16 17 16   Temp:  98.3 F (36.8 C) 98.5 F (36.9 C) 98.3 F (36.8 C)  TempSrc:  Oral Oral Oral  SpO2: 92% 100% 90% 92%  Weight:      Height:        Intake/Output Summary (Last 24 hours) at 01/23/2020 1455 Last data filed at 01/23/2020 1116 Gross per 24 hour  Intake 1691.74 ml  Output 1350 ml  Net 341.74 ml   Filed Weights   01/18/20 1244 01/19/20 1610  Weight: 68 kg 73.8 kg   Gen: 79 y.o. male in no distress Pulm: Nonlabored breathing room air supine. Clear. CV: Regular rate and rhythm. No murmur, rub, or gallop. No JVD, no dependent edema. GI: Abdomen soft, non-tender, non-distended, with normoactive bowel sounds.  Ext: Warm, no deformities Skin: No new rashes, lesions or ulcers on visualized skin. Left heel and 3rd toe with c/d/i dressing. Neuro: Alert and oriented. No focal neurological deficits. Psych: Judgement and insight appear fair. Mood euthymic & affect congruent. Behavior is appropriate.    Data Reviewed: I have personally reviewed following labs and imaging studies  CBC: Recent Labs  Lab 01/18/20 1246 01/19/20 0513 01/22/20 0500  WBC 14.1* 11.1* 11.0*  NEUTROABS 10.7*  --   --   HGB 18.2* 16.4 15.8  HCT 54.7* 49.6 48.7  MCV 92.4 93.6 94.0  PLT 279 253 [id number]   Basic Metabolic Panel: Recent Labs  Lab 01/18/20 1246 01/19/20 0513 01/21/20 0500 01/22/20 0500 01/23/20 0419  NA 136 138  --  139  137  K 3.5 3.9  --  3.2* 3.7  CL 98 101  --  102 102  CO2 27 27  --  27 27  GLUCOSE 174* 90  --  121* 91  BUN 11 12  --  12 11  CREATININE 0.61 0.69 0.66 0.65 0.62  CALCIUM 9.2 8.9  --  8.8* 8.5*  MG  --   --   --  1.8  --    GFR: Estimated Creatinine Clearance: 81.1 mL/min (by C-G formula based on SCr of 0.62 mg/dL). Liver Function Tests: Recent Labs  Lab 01/18/20 1246 01/22/20 0500  AST 16 17  ALT 14 12  ALKPHOS 133* 82  BILITOT 0.7 0.6  PROT 7.7 6.0*  ALBUMIN 4.0 2.9*   No results for input(s): LIPASE, AMYLASE in the last 168 hours. No results for input(s): AMMONIA in the last 168 hours. Coagulation Profile: No results for input(s): INR, PROTIME in the last 168 hours. Cardiac Enzymes: No results for input(s): CKTOTAL, CKMB, CKMBINDEX, TROPONINI in the last 168 hours. BNP (last 3 results) No results for input(s): PROBNP in the last 8760 hours. HbA1C: No results for input(s): HGBA1C in the last 72 hours. CBG: Recent Labs  Lab 01/20/20 2110  01/21/20 0752 01/21/20 1151 01/21/20 1734 01/21/20 2129  GLUCAP 132* 127* 161* 136* 147*   Lipid Profile: No results for input(s): CHOL, HDL, LDLCALC, TRIG, CHOLHDL, LDLDIRECT in the last 72 hours. Thyroid Function Tests: No results for input(s): TSH, T4TOTAL, FREET4, T3FREE, THYROIDAB in the last 72 hours. Anemia Panel: No results for input(s): VITAMINB12, FOLATE, FERRITIN, TIBC, IRON, RETICCTPCT in the last 72 hours. Urine analysis:    Component Value Date/Time   COLORURINE YELLOW (A) 01/19/2020 1516   APPEARANCEUR CLEAR (A) 01/19/2020 1516   APPEARANCEUR Hazy 07/14/2012 2232   LABSPEC 1.010 01/19/2020 1516   LABSPEC 1.024 07/14/2012 2232   PHURINE 7.0 01/19/2020 1516   GLUCOSEU NEGATIVE 01/19/2020 1516   GLUCOSEU Negative 07/14/2012 2232   HGBUR NEGATIVE 01/19/2020 1516   BILIRUBINUR NEGATIVE 01/19/2020 1516   BILIRUBINUR neg 02/10/2019 0847   BILIRUBINUR Negative 07/14/2012 2232   KETONESUR NEGATIVE 01/19/2020  1516   PROTEINUR NEGATIVE 01/19/2020 1516   UROBILINOGEN 0.2 02/10/2019 0847   NITRITE NEGATIVE 01/19/2020 1516   LEUKOCYTESUR SMALL (A) 01/19/2020 1516   LEUKOCYTESUR Negative 07/14/2012 2232   Recent Results (from the past 240 hour(s))  SARS CORONAVIRUS 2 (TAT 6-24 HRS) Nasopharyngeal Nasopharyngeal Swab     Status: None   Collection Time: 01/18/20  4:26 PM   Specimen: Nasopharyngeal Swab  Result Value Ref Range Status   SARS Coronavirus 2 NEGATIVE NEGATIVE Final    Comment: (NOTE) SARS-CoV-2 target nucleic acids are NOT DETECTED. The SARS-CoV-2 RNA is generally detectable in upper and lower respiratory specimens during the acute phase of infection. Negative results do not preclude SARS-CoV-2 infection, do not rule out co-infections with other pathogens, and should not be used as the sole basis for treatment or other patient management decisions. Negative results must be combined with clinical observations, patient history, and epidemiological information. The expected result is Negative. Fact Sheet for Patients: SugarRoll.be Fact Sheet for Healthcare Providers: https://www.woods-mathews.com/ This test is not yet approved or cleared by the [place] FDA and  has been authorized for detection and/or diagnosis of SARS-CoV-2 by FDA under an Emergency Use Authorization (EUA). This EUA will remain  in effect (meaning this test can be used) for the duration of the COVID-19 declaration under Section 56 4(b)(1) of the Act, 21 U.S.C. section 360bbb-3(b)(1), unless the authorization is terminated or revoked sooner. Performed at [facility], [place] [address] [place], [place] [address]   Wound or Superficial Culture     Status: None   Collection Time: 01/18/20  4:53 PM   Specimen: Wound  Result Value Ref Range Status   Specimen Description   Final    WOUND Performed at [facility], [address] [place], [place]  [address]    Special Requests   Final    LEFT 3RD TOE Performed at [facility], [place], [place] [address]    Gram Stain   Final    NO WBC SEEN MODERATE GRAM POSITIVE COCCI MODERATE GRAM POSITIVE RODS RARE GRAM NEGATIVE RODS    Culture   Final    FEW METHICILLIN RESISTANT STAPHYLOCOCCUS AUREUS WITHIN MIXED ORGANISMS Performed at [facility], [address] [place], [place] [address]    Report Status 01/21/2020 FINAL  Final   Organism ID, Bacteria METHICILLIN RESISTANT STAPHYLOCOCCUS AUREUS  Final      Susceptibility   Methicillin resistant staphylococcus aureus - MIC*    CIPROFLOXACIN >=8 RESISTANT Resistant     ERYTHROMYCIN >=8 RESISTANT Resistant  GENTAMICIN <=0.5 SENSITIVE Sensitive     OXACILLIN >=4 RESISTANT Resistant     TETRACYCLINE <=1 SENSITIVE Sensitive     VANCOMYCIN <=0.5 SENSITIVE Sensitive     TRIMETH/SULFA <=10 SENSITIVE Sensitive     CLINDAMYCIN <=0.25 SENSITIVE Sensitive     RIFAMPIN <=0.5 SENSITIVE Sensitive     Inducible Clindamycin NEGATIVE Sensitive     * FEW METHICILLIN RESISTANT STAPHYLOCOCCUS AUREUS  Wound or Superficial Culture     Status: None   Collection Time: 01/18/20  4:53 PM   Specimen: Wound  Result Value Ref Range Status   Specimen Description   Final    WOUND Performed at [facility], [address] [place], [place] [address]    Special Requests   Final    LEFT HEEL Performed at [facility], [address] [place], [place] [address]    Gram Stain   Final    NO WBC SEEN RARE GRAM POSITIVE COCCI Performed at [facility], [place] [address], [place], [place] [address]    Culture FEW METHICILLIN RESISTANT STAPHYLOCOCCUS AUREUS  Final   Report Status 01/21/2020 FINAL  Final   Organism ID, Bacteria METHICILLIN RESISTANT STAPHYLOCOCCUS AUREUS  Final      Susceptibility   Methicillin resistant staphylococcus aureus - MIC*    CIPROFLOXACIN >=8 RESISTANT Resistant      ERYTHROMYCIN >=8 RESISTANT Resistant     GENTAMICIN <=0.5 SENSITIVE Sensitive     OXACILLIN >=4 RESISTANT Resistant     TETRACYCLINE <=1 SENSITIVE Sensitive     VANCOMYCIN 1 SENSITIVE Sensitive     TRIMETH/SULFA <=10 SENSITIVE Sensitive     CLINDAMYCIN <=0.25 SENSITIVE Sensitive     RIFAMPIN <=0.5 SENSITIVE Sensitive     Inducible Clindamycin NEGATIVE Sensitive     * FEW METHICILLIN RESISTANT STAPHYLOCOCCUS AUREUS      Radiology Studies: DG Chest 2 View  Result Date: 01/22/2020 CLINICAL DATA:  Hypoxia, CHF, hypertension, smoker EXAM: CHEST - 2 VIEW COMPARISON:  09/11/2019 FINDINGS: Normal heart size, mediastinal contours, and pulmonary vascularity. Atherosclerotic calcification aorta. Chronic thickening of interstitial markings. No acute infiltrate, pleural effusion or pneumothorax. Bones demineralized. IMPRESSION: Chronic interstitial prominence. No acute abnormalities. Electronically Signed   By: Lavonia Dana M.D.   On: 01/22/2020 14:01    Scheduled Meds: . amLODipine  10 mg Oral Daily  . aspirin EC  81 mg Oral Daily  . atorvastatin  20 mg Oral QHS  . chlorhexidine  60 mL Topical Once  . enoxaparin (LOVENOX) injection  40 mg Subcutaneous Q24H  . feeding supplement (ENSURE ENLIVE)  237 mL Oral TID BM  . feeding supplement  296 mL Oral Once  . folic acid  1 mg Oral Daily  . gabapentin  100 mg Oral TID  . melatonin  5 mg Oral QHS  . metroNIDAZOLE  500 mg Oral Q8H  . multivitamin-lutein  1 capsule Oral Daily  . pantoprazole  40 mg Oral Daily  . sodium chloride flush  3 mL Intravenous Q12H  . traZODone  100 mg Oral QHS  . vitamin B-12  1,000 mcg Oral Daily   Continuous Infusions: . 0.45 % NaCl with KCl 20 mEq / L 75 mL/hr at 01/23/20 1247  . vancomycin Stopped (01/23/20 0335)     LOS: 5 days   Time spent: 25 minutes.  Patrecia Pour, MD [facility] [email or web address] 01/23/2020, 2:55 PM

## 2020-01-24 LAB — VANCOMYCIN, TROUGH: Vancomycin Tr: 9 ug/mL — ABNORMAL LOW (ref 15–20)

## 2020-01-24 LAB — CREATININE, SERUM
Creatinine, Ser: 0.57 mg/dL — ABNORMAL LOW (ref 0.61–1.24)
GFR calc Af Amer: >60 mL/min (ref >60)
GFR calc non Af Amer: >60 mL/min (ref >60)

## 2020-01-24 MED ORDER — SODIUM CHLORIDE 0.9 % IV SOLN
INTRAVENOUS | Status: DC | PRN
  Administered 2020-01-24 – 2020-01-25 (×2): 250 mL via INTRAVENOUS
  Administered 2020-01-26: 12:00:00 1000 mL via INTRAVENOUS

## 2020-01-24 MED ORDER — VANCOMYCIN HCL IN DEXTROSE 750-5 MG/150ML-% IV SOLN
750.0000 mg | Freq: Two times a day (BID) | INTRAVENOUS | Status: DC
  Administered 2020-01-24 – 2020-01-25 (×3): 750 mg via INTRAVENOUS
  Filled 2020-01-24 (×6): qty 150

## 2020-01-24 NOTE — TOC Progression Note (Signed)
Transition of Care [facility]) - Progression Note    Patient Details  Name: TYAN MCCLENTON MRN: [id number] Date of Birth: [birth year]  Transition of [facility] CM/SW Contact  Boris Sharper, LCSW Phone Number:[phone] 01/24/2020, 1:39 PM  Clinical Narrative:    CSW provided pt with bed offer of [facility], [place] and [place] still pending. Pt stated that he would like to stay in the [facility] or [place] area. TOC will offer bed choices as they come.  TOC will continue to follow for discharge planning needs.   Expected Discharge Plan: [place]    Expected Discharge Plan and Services Expected Discharge Plan: [place]   Discharge Planning Services: CM Consult Post Acute Care Choice: [place] Living arrangements for the past 2 months: Single Family Home                                       Social Determinants of Health (SDOH) Interventions    Readmission Risk Interventions No flowsheet data found.

## 2020-01-24 NOTE — Progress Notes (Signed)
Pharmacy Antibiotic Note  Alexander Alvarado is a 79 y.o. male admitted on 01/18/2020. Concern for diabetic foot infection. Pharmacy has been consulted for vancomycin dosing.  Scr (mg/dL): 0.62 > 0.57  - Vascular surgery consulted, now planned angiogram for 4/6 d/t pt inability to lie flat.  - Podiatry planning left heel debridement and left 3rd toe amputations with possible taking metatarsal head 4/6.  - Per MD's note, patient would benefit from intraoperative cultures. Therefore, will continue broad spectrum antibiotics.  Plan: Vanc 1750 mg given 4/3 @ 0120 over 2 hrs Vanc peak 4/3 @ 0415= 39 mcg/ml Vanc trough 4/4 @ 0022= 9 mcg/ml Will adjust Vancomycin to 750 mg IV q12h AUC goal 400-550 Expected AUC 435, Cmin 11.8   likely change to PO abx after surgery.   Height: 5\' 10"  (177.8 cm) Weight: 73.8 kg (162 lb 11.2 oz)(bed scale) IBW/kg (Calculated) : 73  Temp (24hrs), Avg:98.1 F (36.7 C), Min:97.7 F (36.5 C), Max:98.6 F (37 C)  Recent Labs  Lab 01/18/20 1246 01/18/20 1246 01/19/20 0513 01/21/20 0500 01/22/20 0500 01/23/20 0415 01/23/20 0419 01/24/20 0022  WBC 14.1*  --  11.1*  --  11.0*  --   --   --   CREATININE 0.61   < > 0.69 0.66 0.65  --  0.62 0.57*  LATICACIDVEN 1.9  --   --   --   --   --   --   --   VANCOTROUGH  --   --   --   --   --   --   --  9*  VANCOPEAK  --   --   --   --   --  39  --   --    < > = values in this interval not displayed.    Estimated Creatinine Clearance: 81.1 mL/min (A) (by C-G formula based on SCr of 0.57 mg/dL (L)).    No Known Allergies  Antimicrobials this admission: Ceftriaxone 3/29 >> 4/2 Vancomycin 3/29 >> Metronidazole 3/30 >> Zosyn 3/29 >> 3/29  4/4 change Vanc from 1750 mg Q24h to 750 mg q12h  Microbiology results: 3/29 Wound Cx: moderate GPC/GPR; rare GNR; MRSA + Mixed organisms    Thank you for allowing pharmacy to be a part of this patient's care.  Mykah Bellomo A, PharmD 01/24/2020 9:38 AM

## 2020-01-24 NOTE — Progress Notes (Signed)
PROGRESS NOTE  Alexander Alvarado  [id number] DOB: 06/24/1941 DOA: 01/18/2020 PCP: Glean Hess, MD   Brief Narrative: Alexander Horrall Hunteris a 79 y.o.malewith medical history significant ofhypertension, peripheral vascular diseases/pamputation of second toe and CVA  Brought to ED with concern of left-sided weakness which started this morning.Per patient he has some residual weakness on left side but when he got up this morning he was unable to move his left side.He normally walks with the help of walker since his prior stroke,he could not grasp a cup or walk .Hissymptoms started gradually improving as the day passes. He denies any facial droop, denies any change in his vision. He has left-sided drooping eyelid for a long time.  He was also complaining of worsening pain in his left foot. He has chronic wounds in his left third toe and heel. Apparently seen by in wound care nurse at home and getting treatment with no relief. He denies using any antibiotics or seen a physician for that. He has his second toe amputated due to osteo in the past. He denies any fever or chills. No upper respiratory symptoms. He lives alone and denies any sick contacts. He was also complaining of some increased urinary urgency and frequency. Denies any dysuria, hematuria or lower abdominal pain.  Assessment & Plan: Active Problems:   PAD (peripheral artery disease) (HCC)   Osteomyelitis of left foot (HCC)   Malnutrition of moderate degree  Left-sided weakness: Stable, more sensory component than motor on my exam. CT head initially without abnormality. LDL 42. - Pt declines MRI/MRA due to inability to lay flat due to sinus issues. Can consider repeat CT head in the future, though symptoms improved. - Continue PT, OT  Left 3rd toe osteomyelitis and left heel ulceration complicated by PAD: No clinical evidence of venous insufficiency. CRP 3.3 - Vascular surgery consulted, rescheduled angiogram  under anesthesia due to patient's inability to lay flat for 4/6. Pt has hx revascularization in the past. ABI consistent with L > R moderate PAD.  - Podiatry planning left heel debridement and left 3rd toe amputations with possible taking metatarsal head after revascularization.  - Continue vancomycin based on culture results with MRSA, continue po metronidazole per ID recommendations. Can likely switch to po abx after amputation. - CBGs at inpatient goal without insulin. HbA1c consistent with prediabetes at 5.8%. - Continue gabapentin for neuropathic component, prn tramadol. Continue tylenol, toradol.  Tobacco use: Long term.  - Cessation counseling provided, drawing line between his use and current vascular issues.   Acute hypoxic respiratory failure: Unclear etiology though patient has long history of tobacco use and has required supplemental oxygen in past admissions, had aspiration pneumonia.  - CXR reviewed personally showing interstitial prominence present on prior XR, no cardiomegaly or pulmonary edema. No evidence of aspiration. BNP checked (from report of pt intolerance of laying flat) is normal. Wean oxygen. - Consider sleep study as outpatient.  HTN:  - Restarted home antihypertensives.   Urinary urgency, frequency:  - Negative UA.  - Bladder scan prn.  Insomnia:  - Continue trazodone 150mg   RN Pressure Injury Documentation: Pressure Injury 01/20/20 Toe (Comment  which one) Anterior;Left Black dried wound (Active)  01/20/20 0830  Location: Toe (Comment  which one)  Location Orientation: Anterior;Left  Staging:   Wound Description (Comments): Black dried wound  Present on Admission:      Pressure Injury 01/20/20 Heel Left (Active)  01/20/20 0830  Location: Heel  Location Orientation: Left  Staging:  Wound Description (Comments):   Present on Admission:    Moderate protein calorie malnutrition. Supplement protein as able to optimize wound healing.   DVT  prophylaxis: Lovenox (hold on 4/6) Code Status: DNR Family Communication: None at bedside Disposition Plan: Patient from home, will likely require SNF at discharge post operatively.   Consultants:   Podiatry, Dr. Luana Shu  Vascular surgery  Infectious diseases  Procedures:   None  Antimicrobials:  Vancomycin, ceftriaxone, flagyl  Subjective: Has no complaints this morning, pain is controlled. Denies abd pain, N/V/D, chest pain, dyspnea or cough  Objective: Vitals:   01/24/20 0001 01/24/20 0009 01/24/20 0030 01/24/20 0732  BP:  (!) 118/59  (!) 143/57  Pulse:  70  60  Resp:  18    Temp:  98 F (36.7 C)  97.7 F (36.5 C)  TempSrc:  Oral  Oral  SpO2: (!) 85% 91% 94% 94%  Weight:      Height:        Intake/Output Summary (Last 24 hours) at 01/24/2020 1325 Last data filed at 01/24/2020 1047 Gross per 24 hour  Intake 2188.65 ml  Output 2000 ml  Net 188.65 ml   Filed Weights   01/18/20 1244 01/19/20 1610  Weight: 68 kg 73.8 kg   Gen: 79 y.o. male in no distress Pulm: Nonlabored breathing. Clear. CV: Regular rate and rhythm. No murmur, rub, or gallop. No JVD, no dependent edema. GI: Abdomen soft, non-tender, non-distended, with normoactive bowel sounds.  Ext: Warm, no deformities Skin: Dressings on left foot without exudate or bleeding. Otherwise no rashes, lesions or ulcers on visualized skin. Neuro: Drowsy but rousable and oriented. No focal neurological deficits. Psych: Judgement and insight appear fair. Mood euthymic & affect congruent. Behavior is appropriate.    Data Reviewed: I have personally reviewed following labs and imaging studies  CBC: Recent Labs  Lab 01/18/20 1246 01/19/20 0513 01/22/20 0500  WBC 14.1* 11.1* 11.0*  NEUTROABS 10.7*  --   --   HGB 18.2* 16.4 15.8  HCT 54.7* 49.6 48.7  MCV 92.4 93.6 94.0  PLT 279 253 [id number]   Basic Metabolic Panel: Recent Labs  Lab 01/18/20 1246 01/18/20 1246 01/19/20 0513 01/21/20 0500 01/22/20 0500 01/23/20  0419 01/24/20 0022  NA 136  --  138  --  139 137  --   K 3.5  --  3.9  --  3.2* 3.7  --   CL 98  --  101  --  102 102  --   CO2 27  --  27  --  27 27  --   GLUCOSE 174*  --  90  --  121* 91  --   BUN 11  --  12  --  12 11  --   CREATININE 0.61   < > 0.69 0.66 0.65 0.62 0.57*  CALCIUM 9.2  --  8.9  --  8.8* 8.5*  --   MG  --   --   --   --  1.8  --   --    < > = values in this interval not displayed.   GFR: Estimated Creatinine Clearance: 81.1 mL/min (A) (by C-G formula based on SCr of 0.57 mg/dL (L)). Liver Function Tests: Recent Labs  Lab 01/18/20 1246 01/22/20 0500  AST 16 17  ALT 14 12  ALKPHOS 133* 82  BILITOT 0.7 0.6  PROT 7.7 6.0*  ALBUMIN 4.0 2.9*   No results for input(s): LIPASE, AMYLASE in the last 168 hours.  No results for input(s): AMMONIA in the last 168 hours. Coagulation Profile: No results for input(s): INR, PROTIME in the last 168 hours. Cardiac Enzymes: No results for input(s): CKTOTAL, CKMB, CKMBINDEX, TROPONINI in the last 168 hours. BNP (last 3 results) No results for input(s): PROBNP in the last 8760 hours. HbA1C: No results for input(s): HGBA1C in the last 72 hours. CBG: Recent Labs  Lab 01/20/20 2110 01/21/20 0752 01/21/20 1151 01/21/20 1734 01/21/20 2129  GLUCAP 132* 127* 161* 136* 147*   Lipid Profile: No results for input(s): CHOL, HDL, LDLCALC, TRIG, CHOLHDL, LDLDIRECT in the last 72 hours. Thyroid Function Tests: No results for input(s): TSH, T4TOTAL, FREET4, T3FREE, THYROIDAB in the last 72 hours. Anemia Panel: No results for input(s): VITAMINB12, FOLATE, FERRITIN, TIBC, IRON, RETICCTPCT in the last 72 hours. Urine analysis:    Component Value Date/Time   COLORURINE YELLOW (A) 01/19/2020 1516   APPEARANCEUR CLEAR (A) 01/19/2020 1516   APPEARANCEUR Hazy 07/14/2012 2232   LABSPEC 1.010 01/19/2020 1516   LABSPEC 1.024 07/14/2012 2232   PHURINE 7.0 01/19/2020 1516   GLUCOSEU NEGATIVE 01/19/2020 1516   GLUCOSEU Negative  07/14/2012 2232   HGBUR NEGATIVE 01/19/2020 1516   BILIRUBINUR NEGATIVE 01/19/2020 1516   BILIRUBINUR neg 02/10/2019 0847   BILIRUBINUR Negative 07/14/2012 2232   KETONESUR NEGATIVE 01/19/2020 1516   PROTEINUR NEGATIVE 01/19/2020 1516   UROBILINOGEN 0.2 02/10/2019 0847   NITRITE NEGATIVE 01/19/2020 1516   LEUKOCYTESUR SMALL (A) 01/19/2020 1516   LEUKOCYTESUR Negative 07/14/2012 2232   Recent Results (from the past 240 hour(s))  SARS CORONAVIRUS 2 (TAT 6-24 HRS) Nasopharyngeal Nasopharyngeal Swab     Status: None   Collection Time: 01/18/20  4:26 PM   Specimen: Nasopharyngeal Swab  Result Value Ref Range Status   SARS Coronavirus 2 NEGATIVE NEGATIVE Final    Comment: (NOTE) SARS-CoV-2 target nucleic acids are NOT DETECTED. The SARS-CoV-2 RNA is generally detectable in upper and lower respiratory specimens during the acute phase of infection. Negative results do not preclude SARS-CoV-2 infection, do not rule out co-infections with other pathogens, and should not be used as the sole basis for treatment or other patient management decisions. Negative results must be combined with clinical observations, patient history, and epidemiological information. The expected result is Negative. Fact Sheet for Patients: SugarRoll.be Fact Sheet for Healthcare Providers: https://www.woods-mathews.com/ This test is not yet approved or cleared by the [place] FDA and  has been authorized for detection and/or diagnosis of SARS-CoV-2 by FDA under an Emergency Use Authorization (EUA). This EUA will remain  in effect (meaning this test can be used) for the duration of the COVID-19 declaration under Section 56 4(b)(1) of the Act, 21 U.S.C. section 360bbb-3(b)(1), unless the authorization is terminated or revoked sooner. Performed at [facility], [place] [address] [place], [place] [address]   Wound or Superficial Culture     Status: None   Collection  Time: 01/18/20  4:53 PM   Specimen: Wound  Result Value Ref Range Status   Specimen Description   Final    WOUND Performed at [facility], [address] [place], [place] [address]    Special Requests   Final    LEFT 3RD TOE Performed at [facility], [address] [place], [place] [address]    Gram Stain   Final    NO WBC SEEN MODERATE GRAM POSITIVE COCCI MODERATE GRAM POSITIVE RODS RARE GRAM NEGATIVE RODS    Culture   Final  FEW METHICILLIN RESISTANT STAPHYLOCOCCUS AUREUS WITHIN MIXED ORGANISMS Performed at [facility], [place] [address], [place], [place] [address]    Report Status 01/21/2020 FINAL  Final   Organism ID, Bacteria METHICILLIN RESISTANT STAPHYLOCOCCUS AUREUS  Final      Susceptibility   Methicillin resistant staphylococcus aureus - MIC*    CIPROFLOXACIN >=8 RESISTANT Resistant     ERYTHROMYCIN >=8 RESISTANT Resistant     GENTAMICIN <=0.5 SENSITIVE Sensitive     OXACILLIN >=4 RESISTANT Resistant     TETRACYCLINE <=1 SENSITIVE Sensitive     VANCOMYCIN <=0.5 SENSITIVE Sensitive     TRIMETH/SULFA <=10 SENSITIVE Sensitive     CLINDAMYCIN <=0.25 SENSITIVE Sensitive     RIFAMPIN <=0.5 SENSITIVE Sensitive     Inducible Clindamycin NEGATIVE Sensitive     * FEW METHICILLIN RESISTANT STAPHYLOCOCCUS AUREUS  Wound or Superficial Culture     Status: None   Collection Time: 01/18/20  4:53 PM   Specimen: Wound  Result Value Ref Range Status   Specimen Description   Final    WOUND Performed at [facility], [address], [place], [place] [address]    Special Requests   Final    LEFT HEEL Performed at [facility], [address] [place], [place] [address]    Gram Stain   Final    NO WBC SEEN RARE GRAM POSITIVE COCCI Performed at [facility], [place] [address], [place], [place] [address]    Culture FEW METHICILLIN RESISTANT STAPHYLOCOCCUS AUREUS  Final   Report Status 01/21/2020 FINAL  Final   Organism ID,  Bacteria METHICILLIN RESISTANT STAPHYLOCOCCUS AUREUS  Final      Susceptibility   Methicillin resistant staphylococcus aureus - MIC*    CIPROFLOXACIN >=8 RESISTANT Resistant     ERYTHROMYCIN >=8 RESISTANT Resistant     GENTAMICIN <=0.5 SENSITIVE Sensitive     OXACILLIN >=4 RESISTANT Resistant     TETRACYCLINE <=1 SENSITIVE Sensitive     VANCOMYCIN 1 SENSITIVE Sensitive     TRIMETH/SULFA <=10 SENSITIVE Sensitive     CLINDAMYCIN <=0.25 SENSITIVE Sensitive     RIFAMPIN <=0.5 SENSITIVE Sensitive     Inducible Clindamycin NEGATIVE Sensitive     * FEW METHICILLIN RESISTANT STAPHYLOCOCCUS AUREUS      Radiology Studies: DG Chest 2 View  Result Date: 01/22/2020 CLINICAL DATA:  Hypoxia, CHF, hypertension, smoker EXAM: CHEST - 2 VIEW COMPARISON:  09/11/2019 FINDINGS: Normal heart size, mediastinal contours, and pulmonary vascularity. Atherosclerotic calcification aorta. Chronic thickening of interstitial markings. No acute infiltrate, pleural effusion or pneumothorax. Bones demineralized. IMPRESSION: Chronic interstitial prominence. No acute abnormalities. Electronically Signed   By: Lavonia Dana M.D.   On: 01/22/2020 14:01    Scheduled Meds: . amLODipine  10 mg Oral Daily  . aspirin EC  81 mg Oral Daily  . atorvastatin  20 mg Oral QHS  . chlorhexidine  60 mL Topical Once  . enoxaparin (LOVENOX) injection  40 mg Subcutaneous Q24H  . feeding supplement (ENSURE ENLIVE)  237 mL Oral TID BM  . feeding supplement  296 mL Oral Once  . folic acid  1 mg Oral Daily  . gabapentin  100 mg Oral TID  . melatonin  5 mg Oral QHS  . metroNIDAZOLE  500 mg Oral Q8H  . multivitamin-lutein  1 capsule Oral Daily  . pantoprazole  40 mg Oral Daily  . sodium chloride flush  3 mL Intravenous Q12H  . traZODone  100 mg Oral QHS  .  vitamin B-12  1,000 mcg Oral Daily   Continuous Infusions: . 0.45 % NaCl with KCl 20 mEq / L 75 mL/hr at 01/24/20 1047  . Vancomycin       LOS: 6 days   Time spent: 25 minutes.   Patrecia Pour, MD [facility] [email or web address] 01/24/2020, 1:25 PM

## 2020-01-25 LAB — CREATININE, SERUM
Creatinine, Ser: 0.61 mg/dL (ref 0.61–1.24)
GFR calc Af Amer: >60 mL/min (ref >60)
GFR calc non Af Amer: >60 mL/min (ref >60)

## 2020-01-25 LAB — MRSA PCR SCREENING: MRSA by PCR: POSITIVE — AB

## 2020-01-25 MED ORDER — ENSURE PRE-SURGERY PO LIQD
296.0000 mL | Freq: Once | ORAL | Status: AC
  Administered 2020-01-25: 22:00:00 296 mL via ORAL
  Filled 2020-01-25: qty 296

## 2020-01-25 MED ORDER — CHLORHEXIDINE GLUCONATE CLOTH 2 % EX PADS
6.0000 | MEDICATED_PAD | Freq: Every day | CUTANEOUS | Status: AC
  Administered 2020-01-26 – 2020-01-30 (×4): 6 via TOPICAL

## 2020-01-25 MED ORDER — HYDRALAZINE HCL 25 MG PO TABS
25.0000 mg | ORAL_TABLET | Freq: Two times a day (BID) | ORAL | Status: DC
  Administered 2020-01-25 – 2020-02-01 (×12): 25 mg via ORAL
  Filled 2020-01-25 (×13): qty 1

## 2020-01-25 MED ORDER — MUPIROCIN 2 % EX OINT
1.0000 "application " | TOPICAL_OINTMENT | Freq: Two times a day (BID) | CUTANEOUS | Status: AC
  Administered 2020-01-26 – 2020-01-30 (×8): 1 via NASAL
  Filled 2020-01-25: qty 22

## 2020-01-25 MED ORDER — CHLORHEXIDINE GLUCONATE 4 % EX LIQD
60.0000 mL | Freq: Once | CUTANEOUS | Status: AC
  Administered 2020-01-26: 4 via TOPICAL

## 2020-01-25 NOTE — TOC Progression Note (Signed)
Transition of Care [facility]) - Progression Note    Patient Details  Name: Alexander Alvarado MRN: [id number] Date of Birth: 11-Feb-1941  Transition of Care [facility]) CM/SW Contact  Su Hilt, RN Phone Number: 01/25/2020, 10:26 AM  Clinical Narrative:     [place] to start insurance auth, The looked up the patient and they do not manage the patient it is regular Human, I notified [place] to start [place]  Expected Discharge Plan: [place]    Expected Discharge Plan and Services Expected Discharge Plan: [place]   Discharge Planning Services: CM Consult Post Acute Care Choice: [place] Living arrangements for the past 2 months: Single Family Home                                       Social Determinants of Health (SDOH) Interventions    Readmission Risk Interventions No flowsheet data found.

## 2020-01-25 NOTE — TOC Progression Note (Signed)
Transition of Care [facility]) - Progression Note    Patient Details  Name: Alexander Alvarado MRN: [id number] Date of Birth: 05/02/1941  Transition of Care [facility]) CM/SW [place], RN Phone Number: 01/25/2020, 10:13 AM  Clinical Narrative:     I spoke with the patient and he asked me to call his son, I called his son while in the room, We reviewed the bed offers and they decided to go with the [facility]. I accepted the bed offer thru the [facility] and alerted [facility].  I encouraged the son to go ahead and apply for Medicaid is he is looking at long term care, he stated that he would do so.    Expected Discharge Plan: [place]    Expected Discharge Plan and Services Expected Discharge Plan: [place]   Discharge Planning Services: CM Consult Post Acute Care Choice: [place] Living arrangements for the past 2 months: Single Family Home                                       Social Determinants of Health (SDOH) Interventions    Readmission Risk Interventions No flowsheet data found.

## 2020-01-25 NOTE — Care Management Important Message (Signed)
Important Message  Patient Details  Name: Alexander Alvarado MRN: [id number] Date of Birth: Feb 03, 1941   Medicare Important Message Given:  Yes     Juliann Pulse A [name] 01/25/2020, 10:56 AM

## 2020-01-25 NOTE — Progress Notes (Signed)
Patient placed on surgery schedule for 1200 on 01/27/20 (Left partial 3rd ray amputation and heel wound debridement) following vascular procedure on 01/26/20.  Orders placed in chart for procedure.  Please discontinue lovenox 24h prior to procedure and restart 24h after procedure.

## 2020-01-25 NOTE — Progress Notes (Signed)
Nutrition Follow Up Note   DOCUMENTATION CODES:   Non-severe (moderate) malnutrition in context of social or environmental circumstances  INTERVENTION:   Provide Ensure Enlive po TID, each supplement provides 350 kcal and 20 grams of protein.  Provide Ocuvite daily for wound healing (provides zinc, vitamin A, vitamin C, Vitamin E, copper, and selenium).  NUTRITION DIAGNOSIS:   Moderate Malnutrition related to social / environmental circumstances(inadequate oral intake) as evidenced by moderate fat depletion, moderate muscle depletion.  GOAL:   Patient will meet greater than or equal to 90% of their needs  -progressing   MONITOR:   PO intake, Supplement acceptance, Labs, Weight trends, Skin, I & O's  ASSESSMENT:   79 year old male with PMHx of HTN, GERD, PVD, depression, hx CVA who is admitted with left sided weakness, left foot wound with osteomyelitis.   Pt with improved appetite and oral intake; pt eating anywhere from 30-100% of meals but is drinking Ensure supplements. RD will liberalize the heart healthy portion of pt's diet as this is restrictive of protein. No new weight since admit; RD will request weekly weights. Per MD note, pt scheduled for left partial 3rd ray amputation and heel wound debridement 4/7 following angiogram on 01/26/20.    Medications reviewed and include: aspiirn, folic acid, melatonin, metronidazole, ocuvite, protonix, B12, NaCl w/ KCl @75ml /hr, vancomycin   Labs reviewed:   Diet Order:   Diet Order            Diet NPO time specified  Diet effective midnight        Diet heart healthy/carb modified Room service appropriate? Yes; Fluid consistency: Thin  Diet effective now             EDUCATION NEEDS:   Education needs have been addressed  Skin:  Skin Assessment: Skin Integrity Issues:(wounds to left third toe and left heel)  Last BM:  4/3- TYPE 6  Height:   Ht Readings from Last 1 Encounters:  01/18/20 5\' 10"  (1.778 m)   Weight:    Wt Readings from Last 1 Encounters:  01/19/20 73.8 kg   Ideal Body Weight:  75.5 kg  BMI:  Body mass index is 23.34 kg/m.  Estimated Nutritional Needs:   Kcal:  2000-2200  Protein:  100-110 grams  Fluid:  1.8 L/day  Koleen Distance MS, RD, LDN Please refer to [facility] for RD and/or RD on-call/weekend/after hours pager

## 2020-01-25 NOTE — Progress Notes (Signed)
OT Cancellation Note  Patient Details Name: Alexander Alvarado MRN: [id number] DOB: 10-13-1941   Cancelled Treatment:    Reason Eval/Treat Not Completed: Pain limiting ability to participate. On attempt, pt reporting significant constant burning pain in L foot. Declines therapy 2/2 pain, despite encouragement. Will re-attempt at later date/time as appropriate.  Jeni Salles, MPH, MS, OTR/L ascom [phone] 01/25/20, 3:57 PM

## 2020-01-25 NOTE — Progress Notes (Signed)
PT Cancellation Note  Patient Details Name: Alexander Alvarado MRN: [id number] DOB: 01/16/1941   Cancelled Treatment:    Reason Eval/Treat Not Completed: Patient declined participation with PT services secondary to L heel pain.  Pt offered option of supine therex to pt's tolerance with pt again declining.  Pt education provided on physiological benefits of activity with pt voicing understanding continued to decline PT services this date.  Will attempt to see pt at a future date/time as medically appropriate.     Linus Salmons PT, DPT 01/25/20, 1:52 PM

## 2020-01-25 NOTE — Progress Notes (Signed)
PROGRESS NOTE  Alexander Alvarado  [id number] DOB: 1941-06-07 DOA: 01/18/2020 PCP: Glean Hess, MD   Brief Narrative: Alexander Gimbel Hunteris a 79 y.o.malewith medical history significant ofhypertension, peripheral vascular diseases/pamputation of second toe and CVA  Brought to ED with concern of left-sided weakness which started this morning.Per patient he has some residual weakness on left side but when he got up this morning he was unable to move his left side.He normally walks with the help of walker since his prior stroke,he could not grasp a cup or walk .Hissymptoms started gradually improving as the day passes. He denies any facial droop, denies any change in his vision. He has left-sided drooping eyelid for a long time.  He was also complaining of worsening pain in his left foot. He has chronic wounds in his left third toe and heel. Apparently seen by in wound care nurse at home and getting treatment with no relief. He denies using any antibiotics or seen a physician for that. He has his second toe amputated due to osteo in the past. He denies any fever or chills. No upper respiratory symptoms. He lives alone and denies any sick contacts. He was also complaining of some increased urinary urgency and frequency. Denies any dysuria, hematuria or lower abdominal pain.  Assessment & Plan: Active Problems:   PAD (peripheral artery disease) (HCC)   Osteomyelitis of left foot (HCC)   Malnutrition of moderate degree  Left-sided weakness: Stable, more sensory component than motor on my exam. CT head initially without abnormality. LDL 42. - Pt declines MRI/MRA due to inability to lay flat due to sinus issues. Can consider repeat CT head in the future, though symptoms improved. - Continue PT, OT  Left 3rd toe osteomyelitis and left heel ulceration complicated by PAD: No clinical evidence of venous insufficiency. CRP 3.3 - Vascular surgery consulted, rescheduled angiogram  under anesthesia due to patient's inability to lay flat for 4/6. Pt has hx revascularization in the past. ABI consistent with L > R moderate PAD.  - Podiatry planning left heel debridement and left 3rd toe amputations with possible taking metatarsal head after revascularization.  - Continue vancomycin based on culture results with MRSA, continue po metronidazole per ID recommendations. Can likely switch to po abx after amputation. - CBGs at inpatient goal without insulin. HbA1c consistent with prediabetes at 5.8%. - Continue gabapentin for neuropathic component, prn tramadol. Continue tylenol, toradol.  Tobacco use: Long term.  - Cessation counseling provided, drawing line between his use and current vascular issues.   Acute hypoxic respiratory failure: Unclear etiology though patient has long history of tobacco use and has required supplemental oxygen in past admissions, had aspiration pneumonia.  - CXR reviewed personally showing interstitial prominence present on prior XR, no cardiomegaly or pulmonary edema. No evidence of aspiration. BNP checked (from report of pt intolerance of laying flat) is normal. Wean oxygen. - Consider sleep study as outpatient.  HTN:  - Restarted home antihypertensive. BP elevated, restart hydralazine.  Urinary urgency, frequency:  - Negative UA.  - Bladder scan prn.  Insomnia:  - Continue trazodone 150mg   RN Pressure Injury Documentation: Pressure Injury 01/20/20 Toe (Comment  which one) Anterior;Left Black dried wound (Active)  01/20/20 0830  Location: Toe (Comment  which one)  Location Orientation: Anterior;Left  Staging:   Wound Description (Comments): Black dried wound  Present on Admission:      Pressure Injury 01/20/20 Heel Left (Active)  01/20/20 0830  Location: Heel  Location Orientation: Left  Staging:   Wound Description (Comments):   Present on Admission:    Moderate protein calorie malnutrition. Supplement protein as able to optimize  wound healing.   DVT prophylaxis: Lovenox (hold on 4/6) Code Status: DNR Family Communication: None at bedside.  Disposition Plan: Patient from home, will likely require SNF at discharge post operatively. Will check SARS-CoV-2 on 4/7  Consultants:   Podiatry, Dr. Luana Shu  Vascular surgery  Infectious diseases  Procedures:   None  Antimicrobials:  Vancomycin, ceftriaxone, flagyl  Subjective: Pain is controlled, no visual changes, headache, chest pain or dyspnea reported.  Objective: Vitals:   01/24/20 0732 01/24/20 1630 01/24/20 2307 01/25/20 0829  BP: (!) 143/57 (!) 148/84 (!) 108/59 (!) 160/73  Pulse: 60 87 74 75  Resp:  16 17   Temp: 97.7 F (36.5 C) 98.3 F (36.8 C) 98.2 F (36.8 C) 97.8 F (36.6 C)  TempSrc: Oral Oral Oral Oral  SpO2: 94% 93% 93% 93%  Weight:      Height:        Intake/Output Summary (Last 24 hours) at 01/25/2020 1043 Last data filed at 01/25/2020 0949 Gross per 24 hour  Intake 2882.49 ml  Output 2400 ml  Net 482.49 ml   Filed Weights   01/18/20 1244 01/19/20 1610  Weight: 68 kg 73.8 kg   Gen: 79 y.o. male in no distress Pulm: Nonlabored breathing room air. Clear. CV: Regular rate and rhythm. No murmur, rub, or gallop. No JVD, no dependent edema. GI: Abdomen soft, non-tender, non-distended, with normoactive bowel sounds.  Ext: Warm, Left foot wrapped, c/d/i. No surrounding erythema or malodor. Skin: No rashes, lesions or ulcers on visualized skin. Neuro: Alert and oriented. No focal neurological deficits. Psych: Judgement and insight appear fair. Mood euthymic & affect congruent. Behavior is appropriate.     Data Reviewed: I have personally reviewed following labs and imaging studies  CBC: Recent Labs  Lab 01/18/20 1246 01/19/20 0513 01/22/20 0500  WBC 14.1* 11.1* 11.0*  NEUTROABS 10.7*  --   --   HGB 18.2* 16.4 15.8  HCT 54.7* 49.6 48.7  MCV 92.4 93.6 94.0  PLT 279 253 [id number]   Basic Metabolic Panel: Recent Labs  Lab  01/18/20 1246 01/18/20 1246 01/19/20 0513 01/19/20 0513 01/21/20 0500 01/22/20 0500 01/23/20 0419 01/24/20 0022 01/25/20 0407  NA 136  --  138  --   --  139 137  --   --   K 3.5  --  3.9  --   --  3.2* 3.7  --   --   CL 98  --  101  --   --  102 102  --   --   CO2 27  --  27  --   --  27 27  --   --   GLUCOSE 174*  --  90  --   --  121* 91  --   --   BUN 11  --  12  --   --  12 11  --   --   CREATININE 0.61   < > 0.69   < > 0.66 0.65 0.62 0.57* 0.61  CALCIUM 9.2  --  8.9  --   --  8.8* 8.5*  --   --   MG  --   --   --   --   --  1.8  --   --   --    < > = values in this interval not displayed.  GFR: Estimated Creatinine Clearance: 81.1 mL/min (by C-G formula based on SCr of 0.61 mg/dL). Liver Function Tests: Recent Labs  Lab 01/18/20 1246 01/22/20 0500  AST 16 17  ALT 14 12  ALKPHOS 133* 82  BILITOT 0.7 0.6  PROT 7.7 6.0*  ALBUMIN 4.0 2.9*   No results for input(s): LIPASE, AMYLASE in the last 168 hours. No results for input(s): AMMONIA in the last 168 hours. Coagulation Profile: No results for input(s): INR, PROTIME in the last 168 hours. Cardiac Enzymes: No results for input(s): CKTOTAL, CKMB, CKMBINDEX, TROPONINI in the last 168 hours. BNP (last 3 results) No results for input(s): PROBNP in the last 8760 hours. HbA1C: No results for input(s): HGBA1C in the last 72 hours. CBG: Recent Labs  Lab 01/20/20 2110 01/21/20 0752 01/21/20 1151 01/21/20 1734 01/21/20 2129  GLUCAP 132* 127* 161* 136* 147*   Lipid Profile: No results for input(s): CHOL, HDL, LDLCALC, TRIG, CHOLHDL, LDLDIRECT in the last 72 hours. Thyroid Function Tests: No results for input(s): TSH, T4TOTAL, FREET4, T3FREE, THYROIDAB in the last 72 hours. Anemia Panel: No results for input(s): VITAMINB12, FOLATE, FERRITIN, TIBC, IRON, RETICCTPCT in the last 72 hours. Urine analysis:    Component Value Date/Time   COLORURINE YELLOW (A) 01/19/2020 1516   APPEARANCEUR CLEAR (A) 01/19/2020 1516    APPEARANCEUR Hazy 07/14/2012 2232   LABSPEC 1.010 01/19/2020 1516   LABSPEC 1.024 07/14/2012 2232   PHURINE 7.0 01/19/2020 1516   GLUCOSEU NEGATIVE 01/19/2020 1516   GLUCOSEU Negative 07/14/2012 2232   HGBUR NEGATIVE 01/19/2020 1516   BILIRUBINUR NEGATIVE 01/19/2020 1516   BILIRUBINUR neg 02/10/2019 0847   BILIRUBINUR Negative 07/14/2012 2232   KETONESUR NEGATIVE 01/19/2020 1516   PROTEINUR NEGATIVE 01/19/2020 1516   UROBILINOGEN 0.2 02/10/2019 0847   NITRITE NEGATIVE 01/19/2020 1516   LEUKOCYTESUR SMALL (A) 01/19/2020 1516   LEUKOCYTESUR Negative 07/14/2012 2232   Recent Results (from the past 240 hour(s))  SARS CORONAVIRUS 2 (TAT 6-24 HRS) Nasopharyngeal Nasopharyngeal Swab     Status: None   Collection Time: 01/18/20  4:26 PM   Specimen: Nasopharyngeal Swab  Result Value Ref Range Status   SARS Coronavirus 2 NEGATIVE NEGATIVE Final    Comment: (NOTE) SARS-CoV-2 target nucleic acids are NOT DETECTED. The SARS-CoV-2 RNA is generally detectable in upper and lower respiratory specimens during the acute phase of infection. Negative results do not preclude SARS-CoV-2 infection, do not rule out co-infections with other pathogens, and should not be used as the sole basis for treatment or other patient management decisions. Negative results must be combined with clinical observations, patient history, and epidemiological information. The expected result is Negative. Fact Sheet for Patients: SugarRoll.be Fact Sheet for Healthcare Providers: https://www.woods-mathews.com/ This test is not yet approved or cleared by the [place] FDA and  has been authorized for detection and/or diagnosis of SARS-CoV-2 by FDA under an Emergency Use Authorization (EUA). This EUA will remain  in effect (meaning this test can be used) for the duration of the COVID-19 declaration under Section 56 4(b)(1) of the Act, 21 U.S.C. section 360bbb-3(b)(1), unless the  authorization is terminated or revoked sooner. Performed at [facility], [place] [address] [place], [place] [address]   Wound or Superficial Culture     Status: None   Collection Time: 01/18/20  4:53 PM   Specimen: Wound  Result Value Ref Range Status   Specimen Description   Final    WOUND Performed at [facility], [place], [place]  [address]    Special Requests   Final    LEFT 3RD TOE Performed at [facility], [place], [place] [address]    Gram Stain   Final    NO WBC SEEN MODERATE GRAM POSITIVE COCCI MODERATE GRAM POSITIVE RODS RARE GRAM NEGATIVE RODS    Culture   Final    FEW METHICILLIN RESISTANT STAPHYLOCOCCUS AUREUS WITHIN MIXED ORGANISMS Performed at [facility], [place] [address] [place], [place] [address]    Report Status 01/21/2020 FINAL  Final   Organism ID, Bacteria METHICILLIN RESISTANT STAPHYLOCOCCUS AUREUS  Final      Susceptibility   Methicillin resistant staphylococcus aureus - MIC*    CIPROFLOXACIN >=8 RESISTANT Resistant     ERYTHROMYCIN >=8 RESISTANT Resistant     GENTAMICIN <=0.5 SENSITIVE Sensitive     OXACILLIN >=4 RESISTANT Resistant     TETRACYCLINE <=1 SENSITIVE Sensitive     VANCOMYCIN <=0.5 SENSITIVE Sensitive     TRIMETH/SULFA <=10 SENSITIVE Sensitive     CLINDAMYCIN <=0.25 SENSITIVE Sensitive     RIFAMPIN <=0.5 SENSITIVE Sensitive     Inducible Clindamycin NEGATIVE Sensitive     * FEW METHICILLIN RESISTANT STAPHYLOCOCCUS AUREUS  Wound or Superficial Culture     Status: None   Collection Time: 01/18/20  4:53 PM   Specimen: Wound  Result Value Ref Range Status   Specimen Description   Final    WOUND Performed at [facility], [address] [place], [place] [address]    Special Requests   Final    LEFT HEEL Performed at [facility], [address], [place], [place] [address]    Gram Stain   Final    NO WBC SEEN RARE GRAM POSITIVE COCCI Performed  at [facility], [place] [address] [place], [place] [address]    Culture FEW METHICILLIN RESISTANT STAPHYLOCOCCUS AUREUS  Final   Report Status 01/21/2020 FINAL  Final   Organism ID, Bacteria METHICILLIN RESISTANT STAPHYLOCOCCUS AUREUS  Final      Susceptibility   Methicillin resistant staphylococcus aureus - MIC*    CIPROFLOXACIN >=8 RESISTANT Resistant     ERYTHROMYCIN >=8 RESISTANT Resistant     GENTAMICIN <=0.5 SENSITIVE Sensitive     OXACILLIN >=4 RESISTANT Resistant     TETRACYCLINE <=1 SENSITIVE Sensitive     VANCOMYCIN 1 SENSITIVE Sensitive     TRIMETH/SULFA <=10 SENSITIVE Sensitive     CLINDAMYCIN <=0.25 SENSITIVE Sensitive     RIFAMPIN <=0.5 SENSITIVE Sensitive     Inducible Clindamycin NEGATIVE Sensitive     * FEW METHICILLIN RESISTANT STAPHYLOCOCCUS AUREUS      Radiology Studies: No results found.  Scheduled Meds: . amLODipine  10 mg Oral Daily  . aspirin EC  81 mg Oral Daily  . atorvastatin  20 mg Oral QHS  . chlorhexidine  60 mL Topical Once  . feeding supplement (ENSURE ENLIVE)  237 mL Oral TID BM  . feeding supplement  296 mL Oral Once  . folic acid  1 mg Oral Daily  . gabapentin  100 mg Oral TID  . melatonin  5 mg Oral QHS  . metroNIDAZOLE  500 mg Oral Q8H  . multivitamin-lutein  1 capsule Oral Daily  . pantoprazole  40 mg Oral Daily  . sodium chloride flush  3 mL Intravenous Q12H  . traZODone  100 mg Oral QHS  . vitamin B-12  1,000 mcg Oral Daily   Continuous Infusions: . 0.45 % NaCl with  KCl 20 mEq / L 75 mL/hr at 01/24/20 2126  . sodium chloride 250 mL (01/25/20 0035)  . Vancomycin 750 mg (01/25/20 0035)     LOS: 7 days   Time spent: 25 minutes.  Patrecia Pour, MD [facility] [email or web address] 01/25/2020, 10:43 AM

## 2020-01-26 ENCOUNTER — Encounter: Payer: Self-pay | Admitting: Internal Medicine

## 2020-01-26 ENCOUNTER — Inpatient Hospital Stay: Payer: Medicare HMO | Admitting: Anesthesiology

## 2020-01-26 ENCOUNTER — Encounter
Admission: EM | Disposition: A | Discharge status: Skilled Nursing Facility | Payer: Self-pay | Source: Home / Self Care | Attending: Family Medicine

## 2020-01-26 DIAGNOSIS — I70244 Atherosclerosis of native arteries of left leg with ulceration of heel and midfoot: Secondary | ICD-10-CM

## 2020-01-26 HISTORY — PX: LOWER EXTREMITY INTERVENTION: CATH118252

## 2020-01-26 LAB — BASIC METABOLIC PANEL
Anion gap: 7 (ref 5–15)
BUN: 10 mg/dL (ref 8–23)
CO2: 27 mmol/L (ref 22–32)
Calcium: 8.6 mg/dL — ABNORMAL LOW (ref 8.9–10.3)
Chloride: 103 mmol/L (ref 98–111)
Creatinine, Ser: 0.54 mg/dL — ABNORMAL LOW (ref 0.61–1.24)
GFR calc Af Amer: >60 mL/min (ref >60)
GFR calc non Af Amer: >60 mL/min (ref >60)
Glucose, Bld: 96 mg/dL (ref 70–99)
Potassium: 4 mmol/L (ref 3.5–5.1)
Sodium: 137 mmol/L (ref 135–145)

## 2020-01-26 SURGERY — LOWER EXTREMITY INTERVENTION
Anesthesia: General | Laterality: Left

## 2020-01-26 SURGERY — LOWER EXTREMITY ANGIOGRAPHY
Anesthesia: Moderate Sedation | Laterality: Left

## 2020-01-26 MED ORDER — FENTANYL CITRATE (PF) 100 MCG/2ML IJ SOLN: 25.0000 ug | INTRAMUSCULAR | Status: DC | PRN

## 2020-01-26 MED ORDER — TIROFIBAN HCL IN NACL 5-0.9 MG/100ML-% IV SOLN
0.1500 ug/kg/min | INTRAVENOUS | Status: DC
  Filled 2020-01-26 (×3): qty 100

## 2020-01-26 MED ORDER — SODIUM CHLORIDE 0.9% FLUSH
3.0000 mL | Freq: Two times a day (BID) | INTRAVENOUS | Status: DC
  Administered 2020-01-26 – 2020-01-27 (×2): 3 mL via INTRAVENOUS

## 2020-01-26 MED ORDER — GLYCOPYRROLATE 0.2 MG/ML IJ SOLN
INTRAMUSCULAR | Status: DC | PRN
  Administered 2020-01-26: .2 mg via INTRAVENOUS

## 2020-01-26 MED ORDER — DEXAMETHASONE SODIUM PHOSPHATE 10 MG/ML IJ SOLN
INTRAMUSCULAR | Status: DC | PRN
  Administered 2020-01-26: 4 mg via INTRAVENOUS

## 2020-01-26 MED ORDER — PROPOFOL 10 MG/ML IV BOLUS
INTRAVENOUS | Status: DC | PRN
  Administered 2020-01-26: 120 mg via INTRAVENOUS
  Administered 2020-01-26: 20 mg via INTRAVENOUS
  Administered 2020-01-26: 40 mg via INTRAVENOUS

## 2020-01-26 MED ORDER — VANCOMYCIN HCL IN DEXTROSE 750-5 MG/150ML-% IV SOLN
750.0000 mg | Freq: Two times a day (BID) | INTRAVENOUS | Status: DC
  Administered 2020-01-26 – 2020-01-27 (×3): 750 mg via INTRAVENOUS
  Filled 2020-01-26 (×5): qty 150

## 2020-01-26 MED ORDER — FENTANYL CITRATE (PF) 100 MCG/2ML IJ SOLN
INTRAMUSCULAR | Status: DC | PRN
  Administered 2020-01-26 (×4): 25 ug via INTRAVENOUS

## 2020-01-26 MED ORDER — MORPHINE SULFATE (PF) 2 MG/ML IV SOLN
2.0000 mg | INTRAVENOUS | Status: DC | PRN
  Administered 2020-01-27 – 2020-01-30 (×6): 2 mg via INTRAVENOUS
  Filled 2020-01-26 (×6): qty 1

## 2020-01-26 MED ORDER — TIROFIBAN (AGGRASTAT) BOLUS VIA INFUSION
25.0000 ug/kg | Freq: Once | INTRAVENOUS | Status: DC
  Filled 2020-01-26: qty 37

## 2020-01-26 MED ORDER — LIDOCAINE HCL (PF) 2 % IJ SOLN
INTRAMUSCULAR | Status: AC
  Filled 2020-01-26: qty 5

## 2020-01-26 MED ORDER — ONDANSETRON HCL 4 MG/2ML IJ SOLN: 4.0000 mg | Freq: Four times a day (QID) | INTRAMUSCULAR | Status: DC | PRN

## 2020-01-26 MED ORDER — PHENYLEPHRINE HCL (PRESSORS) 10 MG/ML IV SOLN
INTRAVENOUS | Status: AC
  Filled 2020-01-26: qty 1

## 2020-01-26 MED ORDER — ONDANSETRON HCL 4 MG/2ML IJ SOLN
INTRAMUSCULAR | Status: AC
  Filled 2020-01-26: qty 2

## 2020-01-26 MED ORDER — HEPARIN SODIUM (PORCINE) 1000 UNIT/ML IJ SOLN
INTRAMUSCULAR | Status: DC | PRN
  Administered 2020-01-26: 5000 [IU] via INTRAVENOUS
  Administered 2020-01-26: 2000 [IU] via INTRAVENOUS

## 2020-01-26 MED ORDER — SODIUM CHLORIDE 0.9 % IV SOLN
INTRAVENOUS | Status: DC | PRN
  Administered 2020-01-26: 20 ug/min via INTRAVENOUS

## 2020-01-26 MED ORDER — LACTATED RINGERS IV SOLN: INTRAVENOUS | Status: DC | PRN

## 2020-01-26 MED ORDER — TIROFIBAN HCL IV 12.5 MG/250 ML
INTRAVENOUS | Status: AC
  Filled 2020-01-26: qty 250

## 2020-01-26 MED ORDER — SODIUM CHLORIDE 0.9 % IV SOLN: INTRAVENOUS | Status: AC

## 2020-01-26 MED ORDER — DEXAMETHASONE SODIUM PHOSPHATE 10 MG/ML IJ SOLN
INTRAMUSCULAR | Status: AC
  Filled 2020-01-26: qty 1

## 2020-01-26 MED ORDER — SODIUM CHLORIDE 0.9 % IV SOLN: 250.0000 mL | INTRAVENOUS | Status: DC | PRN

## 2020-01-26 MED ORDER — LIDOCAINE HCL (CARDIAC) PF 100 MG/5ML IV SOSY
PREFILLED_SYRINGE | INTRAVENOUS | Status: DC | PRN
  Administered 2020-01-26 (×2): 60 mg via INTRAVENOUS

## 2020-01-26 MED ORDER — EPHEDRINE SULFATE 50 MG/ML IJ SOLN
INTRAMUSCULAR | Status: DC | PRN
  Administered 2020-01-26: 10 mg via INTRAVENOUS
  Administered 2020-01-26 (×2): 5 mg via INTRAVENOUS

## 2020-01-26 MED ORDER — ONDANSETRON HCL 4 MG/2ML IJ SOLN
INTRAMUSCULAR | Status: DC | PRN
  Administered 2020-01-26: 4 mg via INTRAVENOUS

## 2020-01-26 MED ORDER — MIDAZOLAM HCL 2 MG/2ML IJ SOLN
INTRAMUSCULAR | Status: AC
  Filled 2020-01-26: qty 2

## 2020-01-26 MED ORDER — FENTANYL CITRATE (PF) 100 MCG/2ML IJ SOLN
INTRAMUSCULAR | Status: AC
  Filled 2020-01-26: qty 2

## 2020-01-26 MED ORDER — IODIXANOL 320 MG/ML IV SOLN
INTRAVENOUS | Status: DC | PRN
  Administered 2020-01-26: 15:00:00 110 mL via INTRA_ARTERIAL

## 2020-01-26 MED ORDER — SODIUM CHLORIDE 0.9% FLUSH: 3.0000 mL | INTRAVENOUS | Status: DC | PRN

## 2020-01-26 MED ORDER — PROPOFOL 10 MG/ML IV BOLUS
INTRAVENOUS | Status: AC
  Filled 2020-01-26: qty 40

## 2020-01-26 SURGICAL SUPPLY — 30 items
BALLN LUTONIX 6X150X130 (BALLOONS) ×6
BALLN LUTONIX AV 8X60X75 (BALLOONS) ×3
BALLN LUTONIX DCB 5X60X130 (BALLOONS) ×3
BALLN ULTRASCORE 4X40X130 (BALLOONS) ×3
BALLN ULTRVRSE 2X100X150 (BALLOONS) ×3
BALLOON LUTONIX 6X150X130 (BALLOONS) IMPLANT
BALLOON LUTONIX AV 8X60X75 (BALLOONS) IMPLANT
BALLOON LUTONIX DCB 5X60X130 (BALLOONS) IMPLANT
BALLOON ULTRASCORE 4X40X130 (BALLOONS) IMPLANT
BALLOON ULTRVRSE 2X100X150 (BALLOONS) IMPLANT
CATH PIG 70CM (CATHETERS) ×2 IMPLANT
CATH VERT 5FR 125CM (CATHETERS) ×2 IMPLANT
DEVICE PRESTO INFLATION (MISCELLANEOUS) ×3 IMPLANT
DEVICE SAFEGUARD 24CM (GAUZE/BANDAGES/DRESSINGS) ×2 IMPLANT
DEVICE STARCLOSE SE CLOSURE (Vascular Products) ×2 IMPLANT
GLIDEWIRE ADV .035X260CM (WIRE) ×2 IMPLANT
NDL ENTRY 21GA 7CM ECHOTIP (NEEDLE) IMPLANT
NEEDLE ENTRY 21GA 7CM ECHOTIP (NEEDLE) ×3 IMPLANT
PACK ANGIOGRAPHY (CUSTOM PROCEDURE TRAY) ×2 IMPLANT
SET INTRO CAPELLA COAXIAL (SET/KITS/TRAYS/PACK) ×2 IMPLANT
SHEATH BRITE TIP 5FRX11 (SHEATH) ×2 IMPLANT
SHEATH FLEXOR ANSEL2 7FRX45 (SHEATH) ×2 IMPLANT
STENT LIFESTENT 5F 7X80X135 (Permanent Stent) ×2 IMPLANT
STENT LIFESTREAM 7X58X80 (Permanent Stent) ×2 IMPLANT
STENT VIABAHN 7X150X120 (Permanent Stent) ×2 IMPLANT
SYR MEDRAD MARK 7 150ML (SYRINGE) ×2 IMPLANT
TUBING CONTRAST HIGH PRESS 72 (TUBING) ×2 IMPLANT
VALVE HEMO TOUHY BORST Y (ADAPTER) ×2 IMPLANT
WIRE G V18X300CM (WIRE) ×2 IMPLANT
WIRE J 3MM .035X145CM (WIRE) ×2 IMPLANT

## 2020-01-26 NOTE — Progress Notes (Signed)
[facility] Daily Progress Note  Subjective: For angiogram today. No new complaints.   Objective: Vitals:   01/25/20 1615 01/25/20 2019 01/26/20 0024 01/26/20 0747  BP: 137/68 136/70 130/69 131/69  Pulse: 74 76 76 89  Resp:  18 16 16   Temp: 98.4 F (36.9 C) 98.3 F (36.8 C) 98.3 F (36.8 C) 97.9 F (36.6 C)  TempSrc: Oral Oral Oral Oral  SpO2: 95% 90% 90% 96%  Weight:      Height:        Intake/Output Summary (Last 24 hours) at 01/26/2020 1036 Last data filed at 01/26/2020 0855 Gross per 24 hour  Intake 635.36 ml  Output 2100 ml  Net -1464.64 ml   Physical Exam: A&Ox3, NAD CV: RRR Pulmonary: CTA Bilaterally Abdomen: Soft, Nontender, Nondistended Vascular:             Left Lower Extremity: Thigh soft.  Calf soft.  Wound stable.  Unable to palpate pedal pulses.   Laboratory: CBC    Component Value Date/Time   WBC 11.0 (H) 01/22/2020 0500   HGB 15.8 01/22/2020 0500   HGB 20.6 (HH) 02/10/2019 0852   HCT 48.7 01/22/2020 0500   HCT 59.9 (H) 02/10/2019 0852   PLT 263 01/22/2020 0500   PLT 189 02/10/2019 0852   BMET    Component Value Date/Time   NA 137 01/26/2020 0502   NA 138 02/10/2019 0852   NA 138 07/15/2012 0447   K 4.0 01/26/2020 0502   K 3.3 (L) 07/15/2012 0447   CL 103 01/26/2020 0502   CL 104 07/15/2012 0447   CO2 27 01/26/2020 0502   CO2 23 07/15/2012 0447   GLUCOSE 96 01/26/2020 0502   GLUCOSE 79 07/15/2012 0447   BUN 10 01/26/2020 0502   BUN 9 02/10/2019 0852   BUN 18 07/15/2012 0447   CREATININE 0.54 (L) 01/26/2020 0502   CREATININE 0.79 07/15/2012 0447   CALCIUM 8.6 (L) 01/26/2020 0502   CALCIUM 8.7 07/15/2012 0447   GFRNONAA >60 01/26/2020 0502   GFRNONAA >60 07/15/2012 0447   GFRAA >60 01/26/2020 0502   GFRAA >60 07/15/2012 0447   Assessment/Planning: The patient is a 79 year old male who presented to the [facility] emergency department with worsening of his chronic wounds to left  foot.  1) For angiogram with anesthesia today  Discussed with Dr. Eber Hong Lidie Glade PA-C 01/26/2020 10:36 AM

## 2020-01-26 NOTE — Progress Notes (Signed)
Occupational Therapy Treatment Patient Details Name: Alexander Alvarado MRN: [id number] DOB: 1941/03/08 Today's Date: 01/26/2020    History of present illness Alexander Alvarado is a 79 y.o. male who presents with multiple medical issues.  Patient arrived via EMS due to left-sided weakness and concern for potential CVA.  Patient was also noted to have a ulceration to the dorsal aspect of the left third toe with bone exposed as well as posterior aspect left heel.  Patient notes symptoms have been present for for about 2 months.  States they have progressively worsened over the last 2 months, becoming larger in the size and foul-smelling. Pt being followed by vascular surgery with plan for potential surgical intervention later in the week.  PMH includes CVA with residual L sided weakness, PVD, HTN, HLD, R hip hemiarthroplastly.   OT comments  Pt seen for treatment this date to f/u re: safety with ADLs/ADL mobility. Pt initially not very motivated to move this date 2/2 pain in L LE. However, OT facilitates education re: importance of OOB activity including PNA and bed sore prevention. Pt voices becoming slightly more motivated to get OOB- at least setup EOB. However, when OT begins to prep lines/leads to assist pt with transition to EOB sitting, pt then abruptly reports needing to get on bed pan. OT agrees and facilitates pt lateral rolling onto bed pan with MIN A and MIN verbal cues for hand placement. Pt requires slight pre-BM peri care d/t some wetness around external catheter and required MOD A. Pt requests 10-15 mins on bed pan for BM. Note: RN reports pt really needs have BM. OT session concluded at this time. Will continue to follow and assess for best d/c planning.    Follow Up Recommendations  SNF    Equipment Recommendations  Other (comment)(defer to next venue of care.)    Recommendations for Other Services      Precautions / Restrictions Precautions Precautions: Fall Precaution Comments:  High Fall Restrictions Weight Bearing Restrictions: Yes LLE Weight Bearing: Non weight bearing Other Position/Activity Restrictions: prevalon boot to L LE in bed       Mobility Bed Mobility Overal bed mobility: Needs Assistance Bed Mobility: Rolling Rolling: Min assist         General bed mobility comments: Pt agreeable to sitting up side of bed intially and then requests to use bed pan and then requests 10-15 minutes to attempt to have BM.  Transfers                 General transfer comment: pt not agreeable to standing on OT session this AM. States too painful. Pt comments that prevalon boot is uncomfortable. OT offers to remove during mobility and pt still declines to get OOB at this time.    Balance                                           ADL either performed or assessed with clinical judgement   ADL                               Toileting- Clothing Manipulation and Hygiene: Moderate assistance;Bed level Toileting - Clothing Manipulation Details (indicate cue type and reason): MIN A for lateral rolling, bed level on/off bed pan. MIN verbal cues for positioning. MOD a for peri care in  partial side-lying (difficult to commence full side-lying position as pt cannot tolerate HOB flat.             Vision Baseline Vision/History: Wears glasses Wears Glasses: At all times Patient Visual Report: No change from baseline     Perception     Praxis      Cognition Arousal/Alertness: Awake/alert Behavior During Therapy: WFL for tasks assessed/performed;Flat affect Overall Cognitive Status: Within Functional Limits for tasks assessed                                          Exercises Other Exercises Other Exercises: Education re: importance of OOB Activity including PNA and bed sore prevention in addition to circulatory benefits. Pt verbalized understanding, demos moderate reception, voiced some increased  motivation for OOB activity.   Shoulder Instructions       General Comments      Pertinent Vitals/ Pain       Pain Assessment: 0-10 Pain Score: 6  Pain Location: L heel Pain Descriptors / Indicators: Burning Pain Intervention(s): Monitored during session  Home Living                                          Prior Functioning/Environment              Frequency  Min 2X/week        Progress Toward Goals  OT Goals(current goals can now be found in the care plan section)  Progress towards OT goals: OT to reassess next treatment  Acute Rehab OT Goals Patient Stated Goal: to be useful again OT Goal Formulation: With patient Time For Goal Achievement: 02/03/20 Potential to Achieve Goals: Good  Plan Discharge plan remains appropriate;Frequency remains appropriate    Co-evaluation                 AM-PAC OT "6 Clicks" Daily Activity     Outcome Measure   Help from another person eating meals?: A Little Help from another person taking care of personal grooming?: A Little Help from another person toileting, which includes using toliet, bedpan, or urinal?: A Lot Help from another person bathing (including washing, rinsing, drying)?: A Lot Help from another person to put on and taking off regular upper body clothing?: A Little Help from another person to put on and taking off regular lower body clothing?: A Lot 6 Click Score: 15    End of Session    OT Visit Diagnosis: Other abnormalities of gait and mobility (R26.89);Muscle weakness (generalized) (M62.81)   Activity Tolerance Patient tolerated treatment well;Patient limited by pain;Other (comment)(limited by needing to have BM. RN states last charted BM was 3/29)   Patient Left in bed;with call bell/phone within reach;Other (comment)(on bed pan with nursing student present and verbalizing that she will return to remove when pt presses call button.)   Nurse Communication Mobility status         Time: 1006-1017 OT Time Calculation (min): 11 min  Charges: OT General Charges $OT Visit: 1 Visit OT Treatments $Self Care/Home Management : 8-22 mins  Gerrianne Scale, [place], OTR/L ascom [phone] 01/26/20, 10:52 AM

## 2020-01-26 NOTE — TOC Progression Note (Signed)
Transition of Care [facility]) - Progression Note    Patient Details  Name: Alexander Alvarado MRN: [id number] Date of Birth: 1940/12/10  Transition of Care [facility]) CM/SW [place], RN Phone Number: 01/26/2020, 10:31 AM  Clinical Narrative:    Will go to SNF at DC and has chosen [facility] will be [facility] by [facility] tomorrow with anticipated DC on Friday, Covid test will be done tomorrow. Will continue to monitor for needs   Expected Discharge Plan: [place]    Expected Discharge Plan and Services Expected Discharge Plan: [place]   Discharge Planning Services: CM Consult Post Acute Care Choice: [place] Living arrangements for the past 2 months: Single Family Home                                       Social Determinants of Health (SDOH) Interventions    Readmission Risk Interventions No flowsheet data found.

## 2020-01-26 NOTE — Op Note (Signed)
[facility] Percutaneous Study/Intervention Procedural Note   Date of Surgery: 01/26/2020  Surgeon: Hortencia Pilar  Pre-operative Diagnosis: Atherosclerotic occlusive disease bilateral lower extremities with large ulceration of the left foot associated with osteomyelitis.  Post-operative diagnosis: Same  Procedure(s) Performed: 1. Introduction catheter into left lower extremity 3rd order catheter placement  2. Contrast injection left lower extremity for distal runoff  3. Percutaneous transluminal angioplasty and stent placement left femoral-below-knee popliteal bypass graft 4. Percutaneous transluminal angioplasty left peroneal  5. Percutaneous transluminal angioplasty and stent placement left external iliac artery.              6.  Star close closure right common femoral arteriotomy  Anesthesia: General anesthesia  Sheath: 7 French Raby right common femoral retrograde  Contrast: 110 cc  Fluoroscopy Time: 13.9 minutes  Indications: RAYNER ERMAN presents with increasing pain of the left lower extremity.  He has a large ulceration and MRI demonstrates changes of the left foot consistent with osteomyelitis.  This suggests the patient is having limb threatening ischemia. The risks and benefits are reviewed all questions answered patient agrees to proceed.  Procedure:Makyle W Senat is a 79 y.o. y.o. male who was identified and appropriate procedural time out was performed. The patient was then placed supine on the table and prepped and draped in the usual sterile fashion.   Ultrasound was placed in the sterile sleeve and the right groin was evaluated the right common femoral artery was echolucent and pulsatile indicating patency. Image was recorded for the permanent record and under real-time visualization a microneedle was inserted into the common femoral artery followed by the microwire  and then the micro-sheath. A J-wire was then advanced through the micro-sheath and a 5 [place] sheath was then inserted over a J-wire. J-wire was then advanced and a 5 French pigtail catheter was positioned at the level of T12.  AP projection of the aorta was then obtained. Pigtail catheter was repositioned to above the bifurcation and a LAO and RAO view of the pelvis was obtained. Subsequently a pigtail catheter with the stiff angle Glidewire was used to cross the aortic bifurcation the catheter wire were advanced down into the left distal external iliac artery. Oblique view of the femoral bifurcation was then obtained and subsequently the wire was reintroduced and the pigtail catheter negotiated into the SFA representing third order catheter placement. Distal runoff was then performed.  Diagnostic interpretation: The abdominal aorta is opacified with a bolus injection contrast.  There is diffuse disease noted with moderate stenosis but there does not appear to be a hemodynamically significant stricture of the aorta.  There is increasing disease located at the aortic bifurcation.  The left common iliac artery demonstrates moderate stenosis.  There is a string sign noted at the origin of the left internal iliac.  There is a stent placed in the proximal left external iliac artery.  There is greater than 60% in-stent restenosis.  Distal to the stent in the midportion of the left external iliac artery there is a 90% stenosis.  The right common iliac demonstrates a 70 to 80% stenosis.  There is a string sign at the origin of the right internal iliac artery.  The right external iliac artery demonstrates a greater than 90% stenosis.  The left common femoral demonstrates a greater than 60% stenosis.  Profunda femoris is diffusely diseased but patent and there are no hemodynamically significant lesions identified.  The superficial femoral artery is nonvisualized throughout its entire course  however there is a femoral  to below-knee popliteal bypass graft which appears to be vein.  Within the vein there are multiple greater than 90% stenoses one cluster is proximal just below the origin and the second cluster is at Martha's canal.  The distal anastomosis is widely patent.  The distal popliteal is widely patent.  There is occlusion of the posterior tibial throughout its entire course.  There is occlusion of the anterior tibial and its proximal two thirds.  Collaterals reconstitute the distal anterior tibial but the dorsalis pedis demonstrates a subtotal occlusion in its midportion.  The peroneal is the dominant runoff to the foot.  Proximally it has a napkin ring like greater than 90% stenosis.  Distally there is a string sign at the level of the ankle.  There is poor collaterals to the foot.  5000 units of heparin was then given and allowed to circulate and a 7 [place] Raby sheath was advanced up and over the bifurcation and positioned in the femoral artery.  Magnified RAO imaging of the left external iliac artery is then obtained.  And a 7 mm x 58 mm lifestream stent is then deployed beginning right at the origin of the left external iliac and extending distally.  The proximal segment is then postdilated with an 8 mm Lutonix drug-eluting balloon inflated to 14 atm for 1 minute.  The dilator was then reintroduced over the wire and the sheath is advanced through the stent and positioned with the tip in the proximal left common femoral artery.  Wire catheter then negotiated into the bypass graft.  With the advantage wire through the proximal cluster of strictures in the vein bypass first a 5 mm Lutonix balloon is inflated to 12 atm for a minute.  Next a 7 mm x 80 mm life stent is deployed as there was greater than 50% residual stenosis noted.  The life stent is then postdilated with a 6 mm x 150 mm Lutonix drug-eluting balloon extending across the length of the common femoral artery as well.  Follow-up imaging of this stented  segment now demonstrates less than 5% residual stenosis throughout the course.  Distally the wire is negotiated across the second cluster of greater than 90% stenoses which is at Kastens's canal.  A 6 mm x 150 mm Lutonix drug-eluting balloon is advanced across this and inflated to 12 atm for 2 minutes.  Follow-up imaging demonstrated extravasation from a focal point and therefore the wire was rapidly exchanged from the 035 advantage 2018V 18 and a 7 mm x 150 mm Viabahn stent is deployed across this.  It is then postdilated with the 6 mm Lutonix balloon that had already been used.  Follow-up imaging now demonstrates wide patency there is less than 5% residual stenosis.  There is no further extravasation.  Attention is then turned to the peroneal lesions the proximal lesion is treated with a 4 mm x 40 mm ultra score balloon.  Inflation was to 10 atm for 1 minute.  Follow-up imaging demonstrated less than 10% residual stenosis.  The wire was then negotiated down into the distal collaterals at the level of the ankle and a 2 mm x 10 cm ultra versed balloon was delivered down to the distal peroneal 2 separate inflations were made each was to 10 atm for 1 minute.  Follow-up imaging now demonstrated patency of the peroneal there is less than 10% residual stenosis.  Distal outflow is still disadvantaged as there are very few collaterals crossing the  foot and his cardiac output is rather poor and therefore he is initiated on an Aggrastat drip overnight.  After review of these images the sheath is pulled into the right external iliac oblique of the common femoral is obtained and a Star close device deployed. There no immediate Complications.  Findings:  The abdominal aorta is opacified with a bolus injection contrast.  There is diffuse disease noted with moderate stenosis but there does not appear to be a hemodynamically significant stricture of the aorta.  There is increasing disease located at the aortic bifurcation.   The left common iliac artery demonstrates moderate stenosis.  There is a string sign noted at the origin of the left internal iliac.  There is a stent placed in the proximal left external iliac artery.  There is greater than 60% in-stent restenosis.  Distal to the stent in the midportion of the left external iliac artery there is a 90% stenosis.  The right common iliac demonstrates a 70 to 80% stenosis.  There is a string sign at the origin of the right internal iliac artery.  The right external iliac artery demonstrates a greater than 90% stenosis.  The left common femoral demonstrates a greater than 60% stenosis.  Profunda femoris is diffusely diseased but patent and there are no hemodynamically significant lesions identified.  The superficial femoral artery is nonvisualized throughout its entire course however there is a femoral to below-knee popliteal bypass graft which appears to be vein.  Within the vein there are multiple greater than 90% stenoses one cluster is proximal just below the origin and the second cluster is at Sisemore's canal.  The distal anastomosis is widely patent.  The distal popliteal is widely patent.  There is occlusion of the posterior tibial throughout its entire course.  There is occlusion of the anterior tibial and its proximal two thirds.  Collaterals reconstitute the distal anterior tibial but the dorsalis pedis demonstrates a subtotal occlusion in its midportion.  The peroneal is the dominant runoff to the foot.  Proximally it has a napkin ring like greater than 90% stenosis.  Distally there is a string sign at the level of the ankle.  There is poor collaterals to the foot.  Angioplasty and stenting of the left external iliac is successful with less than 5% residual stenosis.  Angioplasty of the common femoral as well as angioplasty and stent placement within the femoral-popliteal bypass is successful with less than 5% residual stenosis.  More distally in the bypass graft there is  an area of stricture that when treated resulted in an area of extravasation.  This is easily controlled with a Viabahn stent deployed and postdilated to 6 mm.  There is less than 5% residual stenosis throughout this segment as well.  The peroneal artery which is the sole runoff is treated with a 4 mm balloon inflation proximally and a 2 mm balloon inflation distally.  Both of which were successful there is less than 10% residual stenosis.  Summary: Successful recanalization left lower extremity for limb salvage, however, his distal small vessel disease and intrinsic arterial supply of the foot remains significantly compromised.  Salvage of the patient's foot remains tenuous.  Given the results of our interventions he would heal a BKA at this point whereas prior to intervention he would not have.   Disposition: Patient was taken to the recovery room in stable condition having tolerated the procedure well.  Alexander Alvarado 01/26/2020,2:50 PM

## 2020-01-26 NOTE — Transfer of Care (Signed)
Immediate Anesthesia Transfer of Care Note  Patient: Alexander Alvarado  Procedure(s) Performed: LOWER EXTREMITY INTERVENTION (Left )  Patient Location: PACU  Anesthesia Type:General  Level of Consciousness: awake, alert  and oriented  Airway & Oxygen Therapy: Patient Spontanous Breathing and Patient connected to face mask oxygen  Post-op Assessment: Report given to RN and Post -op Vital signs reviewed and stable  Post vital signs: Reviewed and stable  Last Vitals:  Vitals Value Taken Time  BP 131/79 01/26/20 1456  Temp 37.2 C 01/26/20 1456  Pulse 89 01/26/20 1507  Resp 16 01/26/20 1507  SpO2 92 % 01/26/20 1507  Vitals shown include unvalidated device data.  Last Pain:  Vitals:   01/26/20 1456  TempSrc:   PainSc: 0-No pain      Patients Stated Pain Goal: 3 ([id number])  Complications: No apparent anesthesia complications

## 2020-01-26 NOTE — OR Nursing (Signed)
Pt reporting sips water just prior to coming to specials recovery however nurse reports he has not had water within reach. And pt on bedrest.

## 2020-01-26 NOTE — Progress Notes (Signed)
PROGRESS NOTE  Alexander Alvarado  [id number] DOB: 1941/01/22 DOA: 01/18/2020 PCP: Glean Hess, MD   Brief Narrative: Quillie Bleakley Hunteris a 79 y.o.malewith medical history significant ofhypertension, peripheral vascular diseases/pamputation of second toe and CVA  Brought to ED with concern of left-sided weakness which started this morning.Per patient he has some residual weakness on left side but when he got up this morning he was unable to move his left side.He normally walks with the help of walker since his prior stroke,he could not grasp a cup or walk .Hissymptoms started gradually improving as the day passes. He denies any facial droop, denies any change in his vision. He has left-sided drooping eyelid for a long time.  He was also complaining of worsening pain in his left foot. He has chronic wounds in his left third toe and heel. Apparently seen by in wound care nurse at home and getting treatment with no relief. He denies using any antibiotics or seen a physician for that. He has his second toe amputated due to osteo in the past. He denies any fever or chills. No upper respiratory symptoms. He lives alone and denies any sick contacts. He was also complaining of some increased urinary urgency and frequency. Denies any dysuria, hematuria or lower abdominal pain.  Assessment & Plan: Active Problems:   PAD (peripheral artery disease) (HCC)   Osteomyelitis of left foot (HCC)   Malnutrition of moderate degree  Left-sided weakness: Stable, more sensory component than motor on my exam. CT head initially without abnormality. LDL 42. - Pt declines MRI/MRA due to inability to lay flat due to sinus issues. Can consider repeat CT head in the future, though symptoms improved. - Continue PT, OT  Left 3rd toe osteomyelitis and left heel ulceration complicated by PAD: No clinical evidence of venous insufficiency. CRP 3.3 - Vascular surgery consulted, rescheduled angiogram  under anesthesia due to patient's inability to lay flat for 4/6. Pt has hx revascularization in the past. ABI consistent with L > R moderate PAD.  - Podiatry planning left heel debridement and left 3rd toe amputations with possible taking metatarsal head after revascularization.  - Continue vancomycin based on culture results with MRSA, continue po metronidazole per ID recommendations. Can likely switch to po abx after amputation. - CBGs at inpatient goal without insulin. HbA1c consistent with prediabetes at 5.8%. - Continue gabapentin for neuropathic component, prn tramadol. Continue tylenol, toradol.  Tobacco use: Long term.  - Cessation counseling provided, drawing line between his use and current vascular issues.   Acute hypoxic respiratory failure: Unclear etiology though patient has long history of tobacco use and has required supplemental oxygen in past admissions, had aspiration pneumonia.  - CXR reviewed personally showing interstitial prominence present on prior XR, no cardiomegaly or pulmonary edema. No evidence of aspiration. BNP checked (from report of pt intolerance of laying flat) is normal. Wean oxygen. - Consider sleep study as outpatient.  HTN:  - Restarted home antihypertensive. BP elevated, restart hydralazine.  Urinary urgency, frequency:  - Negative UA.  - Bladder scan prn.  Insomnia:  - Continue trazodone 150mg   RN Pressure Injury Documentation: Pressure Injury 01/20/20 Toe (Comment  which one) Anterior;Left Black dried wound (Active)  01/20/20 0830  Location: Toe (Comment  which one)  Location Orientation: Anterior;Left  Staging:   Wound Description (Comments): Black dried wound  Present on Admission:      Pressure Injury 01/20/20 Heel Left (Active)  01/20/20 0830  Location: Heel  Location Orientation: Left  Staging:   Wound Description (Comments):   Present on Admission:    Moderate protein calorie malnutrition. Supplement protein as able to optimize  wound healing.   DVT prophylaxis: Lovenox (hold on 4/6) Code Status: DNR Family Communication: None at bedside.  Disposition Plan: Patient from home, will likely require SNF at discharge post operatively. Will check SARS-CoV-2 on 4/7  Consultants:   Podiatry, Dr. Luana Shu  Vascular surgery  Infectious diseases  Procedures:   None  Antimicrobials:  Vancomycin, ceftriaxone, flagyl  Subjective: Pain is controlled, no visual changes, headache, chest pain or dyspnea reported.  Objective: Vitals:   01/25/20 1615 01/25/20 2019 01/26/20 0024 01/26/20 0747  BP: 137/68 136/70 130/69 131/69  Pulse: 74 76 76 89  Resp:  18 16 16   Temp: 98.4 F (36.9 C) 98.3 F (36.8 C) 98.3 F (36.8 C) 97.9 F (36.6 C)  TempSrc: Oral Oral Oral Oral  SpO2: 95% 90% 90% 96%  Weight:      Height:        Intake/Output Summary (Last 24 hours) at 01/26/2020 0907 Last data filed at 01/26/2020 0855 Gross per 24 hour  Intake 875.36 ml  Output 2100 ml  Net -1224.64 ml   Filed Weights   01/18/20 1244 01/19/20 1610  Weight: 68 kg 73.8 kg   Gen: 79 y.o. male in no distress Pulm: Nonlabored breathing room air. Clear. CV: Regular rate and rhythm. No murmur, rub, or gallop. No JVD, no dependent edema. GI: Abdomen soft, non-tender, non-distended, with normoactive bowel sounds.  Ext: Warm, palpable RLE DP pulse, very weak. LUE slightly diffusely edematous without tenderness or redness. Skin: No new rashes, lesions or ulcers on visualized skin. LLE with dressing without purulence or odor or bleeding. Neuro: Alert and oriented. No focal neurological deficits. Psych: Judgement and insight appear fair. Mood euthymic & affect congruent. Behavior is appropriate.    Data Reviewed: I have personally reviewed following labs and imaging studies  CBC: Recent Labs  Lab 01/22/20 0500  WBC 11.0*  HGB 15.8  HCT 48.7  MCV 94.0  PLT 99991111   Basic Metabolic Panel: Recent Labs  Lab 01/22/20 0500 01/23/20 0419  01/24/20 0022 01/25/20 0407 01/26/20 0502  NA 139 137  --   --  137  K 3.2* 3.7  --   --  4.0  CL 102 102  --   --  103  CO2 27 27  --   --  27  GLUCOSE 121* 91  --   --  96  BUN 12 11  --   --  10  CREATININE 0.65 0.62 0.57* 0.61 0.54*  CALCIUM 8.8* 8.5*  --   --  8.6*  MG 1.8  --   --   --   --    Liver Function Tests: Recent Labs  Lab 01/22/20 0500  AST 17  ALT 12  ALKPHOS 82  BILITOT 0.6  PROT 6.0*  ALBUMIN 2.9*   CBG: Recent Labs  Lab 01/20/20 2110 01/21/20 0752 01/21/20 1151 01/21/20 1734 01/21/20 2129  GLUCAP 132* 127* 161* 136* 147*   Urine analysis:    Component Value Date/Time   COLORURINE YELLOW (A) 01/19/2020 1516   APPEARANCEUR CLEAR (A) 01/19/2020 1516   APPEARANCEUR Hazy 07/14/2012 2232   LABSPEC 1.010 01/19/2020 1516   LABSPEC 1.024 07/14/2012 2232   PHURINE 7.0 01/19/2020 1516   GLUCOSEU NEGATIVE 01/19/2020 1516   GLUCOSEU Negative 07/14/2012 2232   HGBUR NEGATIVE 01/19/2020 1516   [place] NEGATIVE 01/19/2020 1516  BILIRUBINUR neg 02/10/2019 0847   BILIRUBINUR Negative 07/14/2012 2232   KETONESUR NEGATIVE 01/19/2020 1516   PROTEINUR NEGATIVE 01/19/2020 1516   UROBILINOGEN 0.2 02/10/2019 0847   NITRITE NEGATIVE 01/19/2020 1516   LEUKOCYTESUR SMALL (A) 01/19/2020 1516   LEUKOCYTESUR Negative 07/14/2012 2232   Recent Results (from the past 240 hour(s))  SARS CORONAVIRUS 2 (TAT 6-24 HRS) Nasopharyngeal Nasopharyngeal Swab     Status: None   Collection Time: 01/18/20  4:26 PM   Specimen: Nasopharyngeal Swab  Result Value Ref Range Status   SARS Coronavirus 2 NEGATIVE NEGATIVE Final    Comment: (NOTE) SARS-CoV-2 target nucleic acids are NOT DETECTED. The SARS-CoV-2 RNA is generally detectable in upper and lower respiratory specimens during the acute phase of infection. Negative results do not preclude SARS-CoV-2 infection, do not rule out co-infections with other pathogens, and should not be used as the sole basis for treatment or  other patient management decisions. Negative results must be combined with clinical observations, patient history, and epidemiological information. The expected result is Negative. Fact Sheet for Patients: SugarRoll.be Fact Sheet for Healthcare Providers: https://www.woods-mathews.com/ This test is not yet approved or cleared by the [place] FDA and  has been authorized for detection and/or diagnosis of SARS-CoV-2 by FDA under an Emergency Use Authorization (EUA). This EUA will remain  in effect (meaning this test can be used) for the duration of the COVID-19 declaration under Section 56 4(b)(1) of the Act, 21 U.S.C. section 360bbb-3(b)(1), unless the authorization is terminated or revoked sooner. Performed at [facility], [place] [address] [place], [place] [address]   Wound or Superficial Culture     Status: None   Collection Time: 01/18/20  4:53 PM   Specimen: Wound  Result Value Ref Range Status   Specimen Description   Final    WOUND Performed at [facility], [address] [place], [place] [address]    Special Requests   Final    LEFT 3RD TOE Performed at [facility], [place], [place] [address]    Gram Stain   Final    NO WBC SEEN MODERATE GRAM POSITIVE COCCI MODERATE GRAM POSITIVE RODS RARE GRAM NEGATIVE RODS    Culture   Final    FEW METHICILLIN RESISTANT STAPHYLOCOCCUS AUREUS WITHIN MIXED ORGANISMS Performed at [facility], [address] [place], [place] [address]    Report Status 01/21/2020 FINAL  Final   Organism ID, Bacteria METHICILLIN RESISTANT STAPHYLOCOCCUS AUREUS  Final      Susceptibility   Methicillin resistant staphylococcus aureus - MIC*    CIPROFLOXACIN >=8 RESISTANT Resistant     ERYTHROMYCIN >=8 RESISTANT Resistant     GENTAMICIN <=0.5 SENSITIVE Sensitive     OXACILLIN >=4 RESISTANT Resistant     TETRACYCLINE <=1 SENSITIVE Sensitive     VANCOMYCIN  <=0.5 SENSITIVE Sensitive     TRIMETH/SULFA <=10 SENSITIVE Sensitive     CLINDAMYCIN <=0.25 SENSITIVE Sensitive     RIFAMPIN <=0.5 SENSITIVE Sensitive     Inducible Clindamycin NEGATIVE Sensitive     * FEW METHICILLIN RESISTANT STAPHYLOCOCCUS AUREUS  Wound or Superficial Culture     Status: None   Collection Time: 01/18/20  4:53 PM   Specimen: Wound  Result Value Ref Range Status   Specimen Description   Final    WOUND Performed at [facility] [address] [place], [place] [address]    Special Requests   Final    LEFT HEEL Performed  at [facility], [address] [place], [place] [address]    Gram Stain   Final    NO WBC SEEN RARE GRAM POSITIVE COCCI Performed at [facility], [place] [address] [place], [place] [address]    Culture FEW METHICILLIN RESISTANT STAPHYLOCOCCUS AUREUS  Final   Report Status 01/21/2020 FINAL  Final   Organism ID, Bacteria METHICILLIN RESISTANT STAPHYLOCOCCUS AUREUS  Final      Susceptibility   Methicillin resistant staphylococcus aureus - MIC*    CIPROFLOXACIN >=8 RESISTANT Resistant     ERYTHROMYCIN >=8 RESISTANT Resistant     GENTAMICIN <=0.5 SENSITIVE Sensitive     OXACILLIN >=4 RESISTANT Resistant     TETRACYCLINE <=1 SENSITIVE Sensitive     VANCOMYCIN 1 SENSITIVE Sensitive     TRIMETH/SULFA <=10 SENSITIVE Sensitive     CLINDAMYCIN <=0.25 SENSITIVE Sensitive     RIFAMPIN <=0.5 SENSITIVE Sensitive     Inducible Clindamycin NEGATIVE Sensitive     * FEW METHICILLIN RESISTANT STAPHYLOCOCCUS AUREUS  MRSA PCR Screening     Status: Abnormal   Collection Time: 01/25/20 10:00 PM   Specimen: Nasopharyngeal  Result Value Ref Range Status   MRSA by PCR POSITIVE (A) NEGATIVE Final    Comment:        The GeneXpert MRSA Assay (FDA approved for NASAL specimens only), is one component of a comprehensive MRSA colonization surveillance program. It is not intended to diagnose MRSA infection nor to guide or monitor treatment  for MRSA infections. RESULT CALLED TO, READ BACK BY AND VERIFIED WITH: Azzie Almas RN [phone] 01/25/20 HNM Performed at [facility], [address], [place], [place] [address]       Radiology Studies: No results found.  Scheduled Meds: . amLODipine  10 mg Oral Daily  . aspirin EC  81 mg Oral Daily  . atorvastatin  20 mg Oral QHS  . chlorhexidine  60 mL Topical Once  . Chlorhexidine Gluconate Cloth  6 each Topical Q0600  . feeding supplement (ENSURE ENLIVE)  237 mL Oral TID BM  . feeding supplement  296 mL Oral Once  . folic acid  1 mg Oral Daily  . gabapentin  100 mg Oral TID  . hydrALAZINE  25 mg Oral BID  . melatonin  5 mg Oral QHS  . metroNIDAZOLE  500 mg Oral Q8H  . multivitamin-lutein  1 capsule Oral Daily  . mupirocin ointment  1 application Nasal BID  . pantoprazole  40 mg Oral Daily  . sodium chloride flush  3 mL Intravenous Q12H  . traZODone  100 mg Oral QHS  . vitamin B-12  1,000 mcg Oral Daily   Continuous Infusions: . 0.45 % NaCl with KCl 20 mEq / L 75 mL/hr at 01/26/20 0804  . sodium chloride Stopped (01/25/20 1523)  . Vancomycin Stopped (01/26/20 0234)     LOS: 8 days   Time spent: 25 minutes.  Patrecia Pour, MD [facility] [email or web address] 01/26/2020, 9:07 AM

## 2020-01-26 NOTE — Anesthesia Preprocedure Evaluation (Addendum)
Anesthesia Evaluation  Patient identified by MRN, date of birth, ID band Patient awake    Reviewed: Allergy & Precautions, H&P , NPO status , Patient's Chart, lab work & pertinent test results  Airway Mallampati: II  TM Distance: >3 FB Neck ROM: full   Comment: beard Dental  (+) Upper Dentures, Lower Dentures   Pulmonary COPD, Current Smoker and Patient abstained from smoking.,  SpO2 88% RA.  Denies home O2.  Unable to lay flat at baseline due to dyspnea    + decreased breath sounds      Cardiovascular hypertension, + Peripheral Vascular Disease and + Orthopnea  + dysrhythmias (LBBB)  Rhythm:regular Rate:Normal     Neuro/Psych PSYCHIATRIC DISORDERS Depression CVA (left-sided weakness), Residual Symptoms    GI/Hepatic Neg liver ROS, GERD  ,  Endo/Other  negative endocrine ROS  Renal/GU      Musculoskeletal   Abdominal   Peds  Hematology negative hematology ROS (+)   Anesthesia Other Findings Presented with left-sided weakness of LUE and LLE.  Previous weakness from past CVA with acute worsening. Head CT negative for acute process, showed evidence of prior strokes.    Past Medical History: No date: Depression No date: GERD (gastroesophageal reflux disease) No date: High cholesterol No date: Hypertension No date: PVD (peripheral vascular disease) ([place]) No date: Stroke [facility])     Comment:  left side weakness  Past Surgical History: 08/31/2015: AMPUTATION; Left     Comment:  Procedure: AMPUTATION DIGIT left 2nd toe ;  Surgeon:               Samara Deist, DPM;  Location: [place];                Service: Podiatry;  Laterality: Left; 08/2015: AMPUTATION TOE; Left     Comment:  second toe No date: CHOLECYSTECTOMY 09/11/2019: HIP ARTHROPLASTY; Right     Comment:  Procedure: ARTHROPLASTY BIPOLAR HIP (HEMIARTHROPLASTY);               Surgeon: Leim Fabry, MD;  Location: [facility];  Service:  Orthopedics;  Laterality: Right; No date: [place] [facility] 2011: VASCULAR SURGERY; Left     Comment:  x 5 to left leg  BMI    Body Mass Index: 23.34 kg/m      Reproductive/Obstetrics negative OB ROS                            Anesthesia Physical Anesthesia Plan  ASA: III  Anesthesia Plan: General LMA   Post-op Pain Management:    Induction:   PONV Risk Score and Plan: Dexamethasone, Ondansetron and Treatment may vary due to age or medical condition  Airway Management Planned:   Additional Equipment:   Intra-op Plan:   Post-operative Plan:   Informed Consent: I have reviewed the patients History and Physical, chart, labs and discussed the procedure including the risks, benefits and alternatives for the proposed anesthesia with the patient or authorized representative who has indicated his/her understanding and acceptance.    Discussed DNR with patient and Continue DNR.   Dental Advisory Given  Plan Discussed with: Anesthesiologist  Anesthesia Plan Comments: (Long discussion with patient about risk of worsening neurologic function due to likely recent stroke.  He expresses understanding and agrees to proceed.   Also discussed DNR. Pt is adamant that he does not want shocks or chest compressions.  OK with intubation for case.  KR)  Anesthesia Quick Evaluation  

## 2020-01-26 NOTE — Progress Notes (Addendum)
Pt in PACU on 3L [place]. No increased work for breathing, no wheezing. Pt able to attain 2500 on incentive spirometer. Oxygen 88-90% periodically. Increases to 97-100% with deep breathing and incentive spirometer use. Noted documented low oxygen levels during hospital admission. Pt alert and oriented, sitting up in bed, tolerating sips of liquid, no complaints of pain.  Dr. Andree Elk at bedside to evaluate pt. Per MD, continue to use incentive spirometer, deep breath and pt safe to return to his room. Pt verbalized understanding and is agreeable with plan.

## 2020-01-26 NOTE — Anesthesia Procedure Notes (Signed)
Procedure Name: LMA Insertion Date/Time: 01/26/2020 12:35 PM Performed by: Cory Munch, RN Pre-anesthesia Checklist: Patient identified, Patient being monitored, Timeout performed, Emergency Drugs available and Suction available Patient Re-evaluated:Patient Re-evaluated prior to induction Oxygen Delivery Method: Circle system utilized Preoxygenation: Pre-oxygenation with 100% oxygen Induction Type: IV induction Ventilation: Mask ventilation without difficulty LMA: LMA inserted LMA Size: 4.0 Tube type: Oral Number of attempts: 2 Placement Confirmation: positive ETCO2 and breath sounds checked- equal and bilateral Tube secured with: Tape Dental Injury: Teeth and Oropharynx as per pre-operative assessment

## 2020-01-27 ENCOUNTER — Inpatient Hospital Stay: Payer: Medicare HMO | Admitting: Anesthesiology

## 2020-01-27 ENCOUNTER — Encounter: Payer: Self-pay | Admitting: Cardiology

## 2020-01-27 ENCOUNTER — Encounter
Admission: EM | Disposition: A | Discharge status: Skilled Nursing Facility | Payer: Self-pay | Source: Home / Self Care | Attending: Family Medicine

## 2020-01-27 HISTORY — PX: AMPUTATION: SHX166

## 2020-01-27 LAB — BASIC METABOLIC PANEL
Anion gap: 9 (ref 5–15)
BUN: 13 mg/dL (ref 8–23)
CO2: 26 mmol/L (ref 22–32)
Calcium: 8.5 mg/dL — ABNORMAL LOW (ref 8.9–10.3)
Chloride: 102 mmol/L (ref 98–111)
Creatinine, Ser: 0.52 mg/dL — ABNORMAL LOW (ref 0.61–1.24)
GFR calc Af Amer: >60 mL/min (ref >60)
GFR calc non Af Amer: >60 mL/min (ref >60)
Glucose, Bld: 148 mg/dL — ABNORMAL HIGH (ref 70–99)
Potassium: 4.1 mmol/L (ref 3.5–5.1)
Sodium: 137 mmol/L (ref 135–145)

## 2020-01-27 LAB — CBC
HCT: 47.3 % (ref 39.0–52.0)
Hemoglobin: 15.7 g/dL (ref 13.0–17.0)
MCH: 30.7 pg (ref 26.0–34.0)
MCHC: 33.2 g/dL (ref 30.0–36.0)
MCV: 92.6 fL (ref 80.0–100.0)
Platelets: 318 10*3/uL (ref 150–400)
RBC: 5.11 MIL/uL (ref 4.22–5.81)
RDW: 13.8 % (ref 11.5–15.5)
WBC: 15 10*3/uL — ABNORMAL HIGH (ref 4.0–10.5)
nRBC: 0 % (ref 0.0–0.2)

## 2020-01-27 LAB — MAGNESIUM: Magnesium: 2 mg/dL (ref 1.7–2.4)

## 2020-01-27 SURGERY — AMPUTATION, FOOT, RAY
Anesthesia: General | Site: Foot | Laterality: Left

## 2020-01-27 MED ORDER — FENTANYL CITRATE (PF) 100 MCG/2ML IJ SOLN: 25.0000 ug | INTRAMUSCULAR | Status: DC | PRN

## 2020-01-27 MED ORDER — PROPOFOL 10 MG/ML IV BOLUS
INTRAVENOUS | Status: DC | PRN
  Administered 2020-01-27: 160 mg via INTRAVENOUS

## 2020-01-27 MED ORDER — GLYCOPYRROLATE 0.2 MG/ML IJ SOLN
INTRAMUSCULAR | Status: DC | PRN
  Administered 2020-01-27: .2 mg via INTRAVENOUS

## 2020-01-27 MED ORDER — ONDANSETRON HCL 4 MG/2ML IJ SOLN: 4.0000 mg | Freq: Once | INTRAMUSCULAR | Status: DC | PRN

## 2020-01-27 MED ORDER — VANCOMYCIN HCL 750 MG/150ML IV SOLN
750.0000 mg | Freq: Two times a day (BID) | INTRAVENOUS | Status: DC
  Administered 2020-01-28 – 2020-01-29 (×3): 750 mg via INTRAVENOUS
  Filled 2020-01-27 (×5): qty 150

## 2020-01-27 MED ORDER — ONDANSETRON HCL 4 MG/2ML IJ SOLN
INTRAMUSCULAR | Status: AC
  Filled 2020-01-27: qty 2

## 2020-01-27 MED ORDER — BUPIVACAINE HCL 0.5 % IJ SOLN
INTRAMUSCULAR | Status: DC | PRN
  Administered 2020-01-27: 14 mL

## 2020-01-27 MED ORDER — ACETAMINOPHEN 10 MG/ML IV SOLN: 1000.0000 mg | Freq: Once | INTRAVENOUS | Status: DC | PRN

## 2020-01-27 MED ORDER — VANCOMYCIN HCL 1000 MG IV SOLR
INTRAVENOUS | Status: AC
  Filled 2020-01-27: qty 1000

## 2020-01-27 MED ORDER — ACETAMINOPHEN 10 MG/ML IV SOLN
INTRAVENOUS | Status: DC | PRN
  Administered 2020-01-27: 1000 mg via INTRAVENOUS

## 2020-01-27 MED ORDER — SODIUM CHLORIDE 0.9 % IV SOLN: INTRAVENOUS | Status: DC

## 2020-01-27 MED ORDER — PHENYLEPHRINE HCL (PRESSORS) 10 MG/ML IV SOLN
INTRAVENOUS | Status: DC | PRN
  Administered 2020-01-27: 100 ug via INTRAVENOUS

## 2020-01-27 MED ORDER — OXYCODONE HCL 5 MG/5ML PO SOLN: 5.0000 mg | Freq: Once | ORAL | Status: DC | PRN

## 2020-01-27 MED ORDER — NEOMYCIN-POLYMYXIN B GU 40-200000 IR SOLN
Status: AC
  Filled 2020-01-27: qty 2

## 2020-01-27 MED ORDER — EPHEDRINE SULFATE 50 MG/ML IJ SOLN
INTRAMUSCULAR | Status: DC | PRN
  Administered 2020-01-27: 10 mg via INTRAVENOUS

## 2020-01-27 MED ORDER — ONDANSETRON HCL 4 MG/2ML IJ SOLN
INTRAMUSCULAR | Status: DC | PRN
  Administered 2020-01-27: 4 mg via INTRAVENOUS

## 2020-01-27 MED ORDER — LIDOCAINE HCL (CARDIAC) PF 100 MG/5ML IV SOSY
PREFILLED_SYRINGE | INTRAVENOUS | Status: DC | PRN
  Administered 2020-01-27: 80 mg via INTRAVENOUS

## 2020-01-27 MED ORDER — LIDOCAINE HCL (PF) 2 % IJ SOLN
INTRAMUSCULAR | Status: AC
  Filled 2020-01-27: qty 5

## 2020-01-27 MED ORDER — ACETAMINOPHEN NICU IV SYRINGE 10 MG/ML
INTRAVENOUS | Status: AC
  Filled 2020-01-27: qty 1

## 2020-01-27 MED ORDER — OXYCODONE HCL 5 MG PO TABS: 5.0000 mg | ORAL_TABLET | Freq: Once | ORAL | Status: DC | PRN

## 2020-01-27 MED ORDER — FENTANYL CITRATE (PF) 100 MCG/2ML IJ SOLN
INTRAMUSCULAR | Status: AC
  Filled 2020-01-27: qty 2

## 2020-01-27 MED ORDER — DEXAMETHASONE SODIUM PHOSPHATE 10 MG/ML IJ SOLN
INTRAMUSCULAR | Status: DC | PRN
  Administered 2020-01-27: 10 mg via INTRAVENOUS

## 2020-01-27 MED ORDER — DEXAMETHASONE SODIUM PHOSPHATE 10 MG/ML IJ SOLN
INTRAMUSCULAR | Status: AC
  Filled 2020-01-27: qty 1

## 2020-01-27 MED ORDER — EPHEDRINE 5 MG/ML INJ
INTRAVENOUS | Status: AC
  Filled 2020-01-27: qty 10

## 2020-01-27 MED ORDER — PROPOFOL 10 MG/ML IV BOLUS
INTRAVENOUS | Status: AC
  Filled 2020-01-27: qty 20

## 2020-01-27 MED ORDER — BUPIVACAINE HCL (PF) 0.5 % IJ SOLN
INTRAMUSCULAR | Status: AC
  Filled 2020-01-27: qty 30

## 2020-01-27 MED ORDER — FENTANYL CITRATE (PF) 100 MCG/2ML IJ SOLN
INTRAMUSCULAR | Status: DC | PRN
  Administered 2020-01-27 (×2): 50 ug via INTRAVENOUS

## 2020-01-27 SURGICAL SUPPLY — 49 items
BLADE MED AGGRESSIVE (BLADE) ×2 IMPLANT
BLADE OSC/SAGITTAL MD 5.5X18 (BLADE) IMPLANT
BLADE SURG 15 STRL LF DISP TIS (BLADE) IMPLANT
BLADE SURG 15 STRL SS (BLADE)
BLADE SURG MINI STRL (BLADE) IMPLANT
BNDG CONFORM 2 STRL LF (GAUZE/BANDAGES/DRESSINGS) IMPLANT
BNDG ELASTIC 4X5.8 VLCR STR LF (GAUZE/BANDAGES/DRESSINGS) ×3 IMPLANT
BNDG ESMARK 4X12 TAN STRL LF (GAUZE/BANDAGES/DRESSINGS) ×3 IMPLANT
BNDG GAUZE 4.5X4.1 6PLY STRL (MISCELLANEOUS) ×3 IMPLANT
CANISTER SUCT 1200ML W/VALVE (MISCELLANEOUS) ×3 IMPLANT
CNTNR SPEC 2.5X3XGRAD LEK (MISCELLANEOUS) ×1
CONT SPEC 4OZ STER OR WHT (MISCELLANEOUS) ×2
CONT SPEC 4OZ STRL OR WHT (MISCELLANEOUS) ×1
CONTAINER SPEC 2.5X3XGRAD LEK (MISCELLANEOUS) ×1 IMPLANT
COVER WAND RF STERILE (DRAPES) ×3 IMPLANT
CUFF TOURN 18 STER (MISCELLANEOUS) ×2 IMPLANT
CUFF TOURN DUAL PL 12 NO SLV (MISCELLANEOUS) IMPLANT
DRAPE FLUOR MINI C-ARM 54X84 (DRAPES) IMPLANT
DURAPREP 26ML APPLICATOR (WOUND CARE) ×3 IMPLANT
ELECT REM PT RETURN 9FT ADLT (ELECTROSURGICAL) ×3
ELECTRODE REM PT RTRN 9FT ADLT (ELECTROSURGICAL) ×1 IMPLANT
GAUZE SPONGE 4X4 12PLY STRL (GAUZE/BANDAGES/DRESSINGS) ×4 IMPLANT
GAUZE XEROFORM 1X8 LF (GAUZE/BANDAGES/DRESSINGS) ×3 IMPLANT
GLOVE BIO SURGEON STRL SZ7 (GLOVE) ×3 IMPLANT
GLOVE INDICATOR 7.0 STRL GRN (GLOVE) ×3 IMPLANT
GOWN STRL REUS W/ TWL LRG LVL3 (GOWN DISPOSABLE) ×2 IMPLANT
GOWN STRL REUS W/TWL LRG LVL3 (GOWN DISPOSABLE) ×6
HANDPIECE VERSAJET DEBRIDEMENT (MISCELLANEOUS) IMPLANT
KIT TURNOVER KIT A (KITS) ×3 IMPLANT
LABEL OR SOLS (LABEL) ×3 IMPLANT
NDL FILTER BLUNT 18X1 1/2 (NEEDLE) ×1 IMPLANT
NDL HYPO 25X1 1.5 SAFETY (NEEDLE) ×2 IMPLANT
NEEDLE FILTER BLUNT 18X 1/2SAF (NEEDLE) ×2
NEEDLE FILTER BLUNT 18X1 1/2 (NEEDLE) ×1 IMPLANT
NEEDLE HYPO 25X1 1.5 SAFETY (NEEDLE) ×6 IMPLANT
NS IRRIG 500ML POUR BTL (IV SOLUTION) ×3 IMPLANT
PACK EXTREMITY ARMC (MISCELLANEOUS) ×3 IMPLANT
PAD ABD DERMACEA PRESS 5X9 (GAUZE/BANDAGES/DRESSINGS) ×4 IMPLANT
SOL .9 NS 3000ML IRR  AL (IV SOLUTION)
SOL .9 NS 3000ML IRR UROMATIC (IV SOLUTION) IMPLANT
SOL PREP PVP 2OZ (MISCELLANEOUS)
SOLUTION PREP PVP 2OZ (MISCELLANEOUS) ×1 IMPLANT
STOCKINETTE STRL 6IN 960660 (GAUZE/BANDAGES/DRESSINGS) ×3 IMPLANT
SUT ETHILON 3-0 FS-10 30 BLK (SUTURE) ×6
SUT VIC AB 3-0 SH 27 (SUTURE) ×6
SUT VIC AB 3-0 SH 27X BRD (SUTURE) ×1 IMPLANT
SUTURE EHLN 3-0 FS-10 30 BLK (SUTURE) ×2 IMPLANT
SWAB CULTURE AMIES ANAERIB BLU (MISCELLANEOUS) IMPLANT
SYR 10ML LL (SYRINGE) ×3 IMPLANT

## 2020-01-27 NOTE — Anesthesia Postprocedure Evaluation (Signed)
Anesthesia Post Note  Patient: Alexander Alvarado  Procedure(s) Performed: AMPUTATION RAY Partial 3rd toe amputation, Left heel debridement (Left Foot)  Patient location during evaluation: PACU Anesthesia Type: General Level of consciousness: awake and alert Pain management: pain level controlled Vital Signs Assessment: post-procedure vital signs reviewed and stable Respiratory status: spontaneous breathing, nonlabored ventilation, respiratory function stable and patient connected to nasal cannula oxygen Cardiovascular status: blood pressure returned to baseline and stable Postop Assessment: no apparent nausea or vomiting Anesthetic complications: no     Last Vitals:  Vitals:   01/27/20 1345 01/27/20 1400  BP: 120/65 118/67  Pulse: 77 84  Resp: 13 18  Temp:  (!) 36.3 C  SpO2: 98% 95%    Last Pain:  Vitals:   01/27/20 1400  TempSrc:   PainSc: 0-No pain                 Arita Miss

## 2020-01-27 NOTE — Progress Notes (Signed)
PROGRESS NOTE  Alexander Alvarado  [id number] DOB: [birth year] DOA: 01/18/2020 PCP: Glean Hess, MD   Brief Narrative: Alexander Desocio Hunteris a 79 y.o.malewith medical history significant ofhypertension, peripheral vascular diseases/pamputation of second toe and CVA  Brought to ED with concern of left-sided weakness which started this morning.Per patient he has some residual weakness on left side but when he got up this morning he was unable to move his left side.He normally walks with the help of walker since his prior stroke,he could not grasp a cup or walk .Hissymptoms started gradually improving as the day passes. He denies any facial droop, denies any change in his vision. He has left-sided drooping eyelid for a long time.  He was also complaining of worsening pain in his left foot. He has chronic wounds in his left third toe and heel. Apparently seen by in wound care nurse at home and getting treatment with no relief. He denies using any antibiotics or seen a physician for that. He has his second toe amputated due to osteo in the past. He denies any fever or chills. No upper respiratory symptoms. He lives alone and denies any sick contacts. He was also complaining of some increased urinary urgency and frequency. Denies any dysuria, hematuria or lower abdominal pain.  Assessment & Plan: Active Problems:   PAD (peripheral artery disease) (HCC)   Osteomyelitis of left foot (HCC)   Malnutrition of moderate degree  Left-sided weakness, possible stroke, history of stroke with left-sided deficits: Persistent deficits. Though CT head initially without abnormality, this may very well have been due to stroke. LDL 42. - Pt declines MRI/MRA as well as repeat CT head due to inability to lay flat due to sinus issues. - Aspirin, statin and risk factor modification to continue. - Continue PT, OT  Left 3rd toe osteomyelitis and left heel ulceration complicated by PAD: No clinical  evidence of venous insufficiency. CRP 3.3 - s/p revascularization with stents in LLE on 4/6.  - Podiatry planning left heel debridement and left 3rd toe amputations with possible taking metatarsal head today 4/7 - Continue vancomycin based on culture results with MRSA, continue po metronidazole per ID recommendations. Can likely switch to po abx after amputation. - CBGs at inpatient goal without insulin. HbA1c consistent with prediabetes at 5.8%. - Continue gabapentin for neuropathic component, prn tramadol. Continue tylenol, toradol.  Tobacco use: Long term.  - Cessation counseling provided, drawing line between his use and current vascular issues.   Acute hypoxic respiratory failure: Unclear etiology though patient has long history of tobacco use and has required supplemental oxygen in past admissions, had aspiration pneumonia. This has resolved.  - Consider sleep study as outpatient.  HTN:  - Restarted home antihypertensive, hydralazine  Urinary urgency, frequency:  - Negative UA.  - Bladder scan prn.  Insomnia:  - Continue trazodone 150mg   RN Pressure Injury Documentation: Pressure Injury 01/20/20 Toe (Comment  which one) Anterior;Left Black dried wound (Active)  01/20/20 0830  Location: Toe (Comment  which one)  Location Orientation: Anterior;Left  Staging:   Wound Description (Comments): Black dried wound  Present on Admission:      Pressure Injury 01/20/20 Heel Left (Active)  01/20/20 0830  Location: Heel  Location Orientation: Left  Staging:   Wound Description (Comments):   Present on Admission:    Moderate protein calorie malnutrition. Supplement protein as able to optimize wound healing.   DVT prophylaxis: Lovenox (hold on 4/6) Code Status: DNR Family Communication: None at bedside.  Disposition Plan: Patient from home, will likely require SNF at discharge post operatively. Will check SARS-CoV-2 today in preparation for discharge alter this  week.  Consultants:   Podiatry, Dr. Luana Shu  Vascular surgery  Infectious diseases  Procedures:   None  Antimicrobials:  Vancomycin, ceftriaxone, flagyl  Subjective: Sleeping much better and complaining of significant left heel pain that is improved with medications. Ok with going to SNF. States his left arm apraxia is unchanged but declines repeat CT head.  Objective: Vitals:   01/26/20 1646 01/26/20 2307 01/27/20 1000 01/27/20 1131  BP: (!) 151/77 (!) 148/69 138/68 (!) 150/69  Pulse: (!) 58 62 65 77  Resp:  17  16  Temp: 98.7 F (37.1 C) 98.6 F (37 C) 98.4 F (36.9 C) 98.1 F (36.7 C)  TempSrc:  Oral Oral Temporal  SpO2: 95% 96% 95% 95%  Weight:      Height:        Intake/Output Summary (Last 24 hours) at 01/27/2020 1241 Last data filed at 01/27/2020 [id number] Gross per 24 hour  Intake 1776 ml  Output 1600 ml  Net 176 ml   Filed Weights   01/18/20 1244 01/19/20 1610  Weight: 68 kg 73.8 kg   Gen: 79 y.o. male in no distress Pulm: Nonlabored breathing room air. Clear. CV: Regular rate and rhythm. No murmur, rub, or gallop. No JVD, no significant dependent edema. GI: Abdomen soft, non-tender, non-distended, with normoactive bowel sounds.  Ext: Warm, no deformities. Skin: No new rashes, lesions or ulcers on visualized skin. Neuro: Alert and oriented. Left arm apraxia, left eye ptosis. Psych: Judgement and insight appear fair. Mood euthymic & affect congruent. Behavior is appropriate.    Data Reviewed: I have personally reviewed following labs and imaging studies  CBC: Recent Labs  Lab 01/22/20 0500 01/27/20 0500  WBC 11.0* 15.0*  HGB 15.8 15.7  HCT 48.7 47.3  MCV 94.0 92.6  PLT 263 0000000   Basic Metabolic Panel: Recent Labs  Lab 01/22/20 0500 01/22/20 0500 01/23/20 0419 01/24/20 0022 01/25/20 0407 01/26/20 0502 01/27/20 0500  NA 139  --  137  --   --  137 137  K 3.2*  --  3.7  --   --  4.0 4.1  CL 102  --  102  --   --  103 102  CO2 27  --  27  --    --  27 26  GLUCOSE 121*  --  91  --   --  96 148*  BUN 12  --  11  --   --  10 13  CREATININE 0.65   < > 0.62 0.57* 0.61 0.54* 0.52*  CALCIUM 8.8*  --  8.5*  --   --  8.6* 8.5*  MG 1.8  --   --   --   --   --  2.0   < > = values in this interval not displayed.   Liver Function Tests: Recent Labs  Lab 01/22/20 0500  AST 17  ALT 12  ALKPHOS 82  BILITOT 0.6  PROT 6.0*  ALBUMIN 2.9*   CBG: Recent Labs  Lab 01/20/20 2110 01/21/20 0752 01/21/20 1151 01/21/20 1734 01/21/20 2129  GLUCAP 132* 127* 161* 136* 147*   Urine analysis:    Component Value Date/Time   COLORURINE YELLOW (A) 01/19/2020 1516   APPEARANCEUR CLEAR (A) 01/19/2020 1516   APPEARANCEUR Hazy 07/14/2012 2232   LABSPEC 1.010 01/19/2020 1516   LABSPEC 1.024 07/14/2012 2232  PHURINE 7.0 01/19/2020 [place] 01/19/2020 1516   GLUCOSEU Negative 07/14/2012 2232   HGBUR NEGATIVE 01/19/2020 1516   [place] 01/19/2020 1516   BILIRUBINUR neg 02/10/2019 0847   BILIRUBINUR Negative 07/14/2012 2232   KETONESUR NEGATIVE 01/19/2020 1516   PROTEINUR NEGATIVE 01/19/2020 1516   UROBILINOGEN 0.2 02/10/2019 0847   NITRITE NEGATIVE 01/19/2020 1516   LEUKOCYTESUR SMALL (A) 01/19/2020 1516   LEUKOCYTESUR Negative 07/14/2012 2232   Recent Results (from the past 240 hour(s))  SARS CORONAVIRUS 2 (TAT 6-24 HRS) Nasopharyngeal Nasopharyngeal Swab     Status: None   Collection Time: 01/18/20  4:26 PM   Specimen: Nasopharyngeal Swab  Result Value Ref Range Status   SARS Coronavirus 2 NEGATIVE NEGATIVE Final    Comment: (NOTE) SARS-CoV-2 target nucleic acids are NOT DETECTED. The SARS-CoV-2 RNA is generally detectable in upper and lower respiratory specimens during the acute phase of infection. Negative results do not preclude SARS-CoV-2 infection, do not rule out co-infections with other pathogens, and should not be used as the sole basis for treatment or other patient management  decisions. Negative results must be combined with clinical observations, patient history, and epidemiological information. The expected result is Negative. Fact Sheet for Patients: SugarRoll.be Fact Sheet for Healthcare Providers: https://www.woods-mathews.com/ This test is not yet approved or cleared by the [place] FDA and  has been authorized for detection and/or diagnosis of SARS-CoV-2 by FDA under an Emergency Use Authorization (EUA). This EUA will remain  in effect (meaning this test can be used) for the duration of the COVID-19 declaration under Section 56 4(b)(1) of the Act, 21 U.S.C. section 360bbb-3(b)(1), unless the authorization is terminated or revoked sooner. Performed at [facility], [place] [address] [place], [place] [address]   Wound or Superficial Culture     Status: None   Collection Time: 01/18/20  4:53 PM   Specimen: Wound  Result Value Ref Range Status   Specimen Description   Final    WOUND Performed at [facility], [address] [place], [place] [address]    Special Requests   Final    LEFT 3RD TOE Performed at [facility], [place], [place] [address]    Gram Stain   Final    NO WBC SEEN MODERATE GRAM POSITIVE COCCI MODERATE GRAM POSITIVE RODS RARE GRAM NEGATIVE RODS    Culture   Final    FEW METHICILLIN RESISTANT STAPHYLOCOCCUS AUREUS WITHIN MIXED ORGANISMS Performed at [facility], [address], [place], [place] [address]    Report Status 01/21/2020 FINAL  Final   Organism ID, Bacteria METHICILLIN RESISTANT STAPHYLOCOCCUS AUREUS  Final      Susceptibility   Methicillin resistant staphylococcus aureus - MIC*    CIPROFLOXACIN >=8 RESISTANT Resistant     ERYTHROMYCIN >=8 RESISTANT Resistant     GENTAMICIN <=0.5 SENSITIVE Sensitive     OXACILLIN >=4 RESISTANT Resistant     TETRACYCLINE <=1 SENSITIVE Sensitive     VANCOMYCIN <=0.5 SENSITIVE Sensitive      TRIMETH/SULFA <=10 SENSITIVE Sensitive     CLINDAMYCIN <=0.25 SENSITIVE Sensitive     RIFAMPIN <=0.5 SENSITIVE Sensitive     Inducible Clindamycin NEGATIVE Sensitive     * FEW METHICILLIN RESISTANT STAPHYLOCOCCUS AUREUS  Wound or Superficial Culture     Status: None   Collection Time: 01/18/20  4:53 PM   Specimen: Wound  Result Value Ref Range Status   Specimen Description   Final  WOUND Performed at [facility], [address], [place], [place] [address]    Special Requests   Final    LEFT HEEL Performed at [facility], [place], [place] [address]    Gram Stain   Final    NO WBC SEEN RARE GRAM POSITIVE COCCI Performed at [facility], [place] [address], [place], [place] [address]    Culture FEW METHICILLIN RESISTANT STAPHYLOCOCCUS AUREUS  Final   Report Status 01/21/2020 FINAL  Final   Organism ID, Bacteria METHICILLIN RESISTANT STAPHYLOCOCCUS AUREUS  Final      Susceptibility   Methicillin resistant staphylococcus aureus - MIC*    CIPROFLOXACIN >=8 RESISTANT Resistant     ERYTHROMYCIN >=8 RESISTANT Resistant     GENTAMICIN <=0.5 SENSITIVE Sensitive     OXACILLIN >=4 RESISTANT Resistant     TETRACYCLINE <=1 SENSITIVE Sensitive     VANCOMYCIN 1 SENSITIVE Sensitive     TRIMETH/SULFA <=10 SENSITIVE Sensitive     CLINDAMYCIN <=0.25 SENSITIVE Sensitive     RIFAMPIN <=0.5 SENSITIVE Sensitive     Inducible Clindamycin NEGATIVE Sensitive     * FEW METHICILLIN RESISTANT STAPHYLOCOCCUS AUREUS  MRSA PCR Screening     Status: Abnormal   Collection Time: 01/25/20 10:00 PM   Specimen: Nasopharyngeal  Result Value Ref Range Status   MRSA by PCR POSITIVE (A) NEGATIVE Final    Comment:        The GeneXpert MRSA Assay (FDA approved for NASAL specimens only), is one component of a comprehensive MRSA colonization surveillance program. It is not intended to diagnose MRSA infection nor to guide or monitor treatment for MRSA  infections. RESULT CALLED TO, READ BACK BY AND VERIFIED WITH: Azzie Almas RN [phone] 01/25/20 HNM Performed at [facility], [address] [place], [place] [address]       Radiology Studies: PERIPHERAL VASCULAR CATHETERIZATION  Result Date: 01/26/2020 See Op Note   Scheduled Meds: . [MAR Hold] amLODipine  10 mg Oral Daily  . [MAR Hold] aspirin EC  81 mg Oral Daily  . [MAR Hold] atorvastatin  20 mg Oral QHS  . [MAR Hold] Chlorhexidine Gluconate Cloth  6 each Topical Q0600  . [MAR Hold] feeding supplement (ENSURE ENLIVE)  237 mL Oral TID BM  . [MAR Hold] folic acid  1 mg Oral Daily  . [MAR Hold] gabapentin  100 mg Oral TID  . [MAR Hold] hydrALAZINE  25 mg Oral BID  . [MAR Hold] melatonin  5 mg Oral QHS  . [MAR Hold] metroNIDAZOLE  500 mg Oral Q8H  . [MAR Hold] multivitamin-lutein  1 capsule Oral Daily  . [MAR Hold] mupirocin ointment  1 application Nasal BID  . [MAR Hold] pantoprazole  40 mg Oral Daily  . [MAR Hold] sodium chloride flush  3 mL Intravenous Q12H  . [MAR Hold] sodium chloride flush  3 mL Intravenous Q12H  . [MAR Hold] tirofiban  25 mcg/kg Intravenous Once  . [MAR Hold] traZODone  100 mg Oral QHS  . [MAR Hold] vitamin B-12  1,000 mcg Oral Daily   Continuous Infusions: . 0.45 % NaCl with KCl 20 mEq / L 75 mL/hr at 01/26/20 2227  . [MAR Hold] sodium chloride 1,000 mL (01/26/20 1151)  . [MAR Hold] sodium chloride    . sodium chloride 50 mL/hr at 01/27/20 1214  . [MAR Hold] Vancomycin 750 mg (01/27/20 0124)  . [MAR Hold] vancomycin       LOS: 9 days  Time spent: 25 minutes.  Patrecia Pour, MD [facility] [email or web address] 01/27/2020, 12:41 PM

## 2020-01-27 NOTE — Progress Notes (Signed)
PT Cancellation Note  Patient Details Name: MARICUS RICCIARDELLI MRN: [id number] DOB: 1941-06-13   Cancelled Treatment:    Reason Eval/Treat Not Completed: (Patient s/p surgical intervention with L 3rd ray partial amputation this date; continue upon transfers received.  Will plan for PT re-evaluation next date as medically appropriate post-op.)   Karder Goodin H. Owens Shark, PT, DPT, NCS 01/27/20, 3:24 PM [phone]

## 2020-01-27 NOTE — Anesthesia Postprocedure Evaluation (Signed)
Anesthesia Post Note  Patient: Alexander Alvarado  Procedure(s) Performed: LOWER EXTREMITY INTERVENTION (Left )  Patient location during evaluation: PACU Anesthesia Type: General Level of consciousness: awake and alert Pain management: pain level controlled Vital Signs Assessment: post-procedure vital signs reviewed and stable Respiratory status: spontaneous breathing, nonlabored ventilation, respiratory function stable and patient connected to nasal cannula oxygen Cardiovascular status: blood pressure returned to baseline and stable Postop Assessment: no apparent nausea or vomiting Anesthetic complications: no     Last Vitals:  Vitals:   01/26/20 1646 01/26/20 2307  BP: (!) 151/77 (!) 148/69  Pulse: (!) 58 62  Resp:  17  Temp: 37.1 C 37 C  SpO2: 95% 96%    Last Pain:  Vitals:   01/26/20 2307  TempSrc: Oral  PainSc:                  Precious Haws Terrianna Holsclaw

## 2020-01-27 NOTE — Progress Notes (Signed)
HISTORY AND PHYSICAL INTERVAL NOTE:  01/27/2020  12:12 PM  Alexander Alvarado  has presented today for surgery, with the diagnosis of Gangrene Left 3rd toe, heel ulceration.  The various methods of treatment have been discussed with the patient.  No guarantees were given.  After consideration of risks, benefits and other options for treatment, the patient has consented to surgery.  I have reviewed the patients' chart and labs.    PROCEDURE: LEFT 3RD TOE AMPUTATION, LEFT ULCER WOUND DEBRIDEMENT 100% SUBCUTANEOUS EXCISIONAL  A history and physical examination was performed in my office.  The patient was reexamined.  There have been no changes to this history and physical examination.  Caroline More, DPM

## 2020-01-27 NOTE — Anesthesia Preprocedure Evaluation (Addendum)
Anesthesia Evaluation  Patient identified by MRN, date of birth, ID band Patient awake    Reviewed: Allergy & Precautions, H&P , NPO status , Patient's Chart, lab work & pertinent test results  History of Anesthesia Complications Negative for: history of anesthetic complications  Airway Mallampati: I  TM Distance: >3 FB Neck ROM: full   Comment: beard Dental  (+) Edentulous Upper, Edentulous Lower   Pulmonary COPD, Current Smoker and Patient abstained from smoking.,  SpO2 88% RA.  Denies home O2.  Unable to lay flat at baseline due to dyspnea    + decreased breath sounds      Cardiovascular hypertension, + Peripheral Vascular Disease, +CHF and + Orthopnea  Normal cardiovascular exam+ dysrhythmias (LBBB)  Rhythm:regular Rate:Normal  TTE: 1. Left ventricular ejection fraction, by estimation, is 45%. The left  ventricle has mild to moderately decreased function. The left ventricle  demonstrates global hypokinesis. Left ventricular diastolic parameters are  consistent with Grade I diastolic  dysfunction (impaired relaxation).  2. Right ventricular systolic function is normal. The right ventricular  size is normal. Tricuspid regurgitation signal is inadequate for assessing  PA pressure.  3. The mitral valve is degenerative. No evidence of mitral valve  regurgitation. No evidence of mitral stenosis.  4. The aortic valve was not well visualized. Aortic valve regurgitation  is not visualized. Mild aortic valve sclerosis is present, with no  evidence of aortic valve stenosis.    Neuro/Psych PSYCHIATRIC DISORDERS Depression CVA (left-sided weakness), Residual Symptoms    GI/Hepatic Neg liver ROS, GERD  ,  Endo/Other  negative endocrine ROS  Renal/GU      Musculoskeletal   Abdominal   Peds  Hematology negative hematology ROS (+)   Anesthesia Other Findings Presented with left-sided weakness of LUE and LLE.  Previous  weakness from past CVA with acute worsening. Head CT negative for acute process, showed evidence of prior strokes.    Past Medical History: No date: Depression No date: GERD (gastroesophageal reflux disease) No date: High cholesterol No date: Hypertension No date: PVD (peripheral vascular disease) ([place]) No date: Stroke [facility])     Comment:  left side weakness  Past Surgical History: 08/31/2015: AMPUTATION; Left     Comment:  Procedure: AMPUTATION DIGIT left 2nd toe ;  Surgeon:               Samara Deist, DPM;  Location: [place];                Service: Podiatry;  Laterality: Left; 08/2015: AMPUTATION TOE; Left     Comment:  second toe No date: CHOLECYSTECTOMY 09/11/2019: HIP ARTHROPLASTY; Right     Comment:  Procedure: ARTHROPLASTY BIPOLAR HIP (HEMIARTHROPLASTY);               Surgeon: Leim Fabry, MD;  Location: [facility];  Service:              Orthopedics;  Laterality: Right; No date: [place] SURGERY 2011: VASCULAR SURGERY; Left     Comment:  x 5 to left leg  BMI    Body Mass Index: 23.34 kg/m      Reproductive/Obstetrics negative OB ROS                            Anesthesia Physical  Anesthesia Plan  ASA: III  Anesthesia Plan: General LMA   Post-op Pain Management:    Induction: Intravenous  PONV Risk Score and Plan: 1  and Dexamethasone, Ondansetron and Treatment may vary due to age or medical condition  Airway Management Planned: LMA  Additional Equipment:   Intra-op Plan:   Post-operative Plan: Extubation in OR  Informed Consent: I have reviewed the patients History and Physical, chart, labs and discussed the procedure including the risks, benefits and alternatives for the proposed anesthesia with the patient or authorized representative who has indicated his/her understanding and acceptance.   Patient has DNR.  Discussed DNR with patient and Continue DNR.   Dental Advisory Given  Plan Discussed with: CRNA and  Surgeon  Anesthesia Plan Comments: (Discussed risks of anesthesia with patient, including PONV, sore throat, damage to lips/tongue/throat, as well as more serious complications like cardiorespiratory or neurologic injury. Patient expresses understanding and agrees to proceed.    Also discussed DNR. Pt is adamant that he does not want shocks or chest compressions even in the perioperative period.  OK with intubation for case.   Note: patient says he thinks he had a strawberry Ensure drink this morning, but does not recall whether it was clear or milky. Called to floor RN who said patient has been strictly NPO and that perhaps he was confusing his PO intake prior to yesterday's anesthetic. Patient is occasionally forgetful at baseline. Will proceed with assessment that patient has been NPO properly)      Anesthesia Quick Evaluation

## 2020-01-27 NOTE — Op Note (Signed)
PODIATRY / FOOT AND ANKLE SURGERY OPERATIVE REPORT    SURGEON: Caroline More, DPM  PRE-OPERATIVE DIAGNOSIS:  1.  Left third toe gangrene, osteomyelitis, cellulitis 2.  Left posterior/plantar subcutaneous heel ulceration measures approximately 1 cm x 0.5 cm x 0.2 cm 3.  Diabetes type 2 with polyneuropathy 4.  Peripheral vascular disease status post revascularization procedure.  POST-OPERATIVE DIAGNOSIS: Same  PROCEDURE(S): 1. Left partial third ray amputation 2. Left 100% excisional subcutaneous wound debridement performed to posterior/plantar heel wound  HEMOSTASIS: None tourniquet  ANESTHESIA: MAC  ESTIMATED BLOOD LOSS: 20 cc  FINDING(S): 1.  Grossly infected left third toe with gangrenous changes to the level of the metatarsal phalangeal joint 2.  Subcutaneous ulceration left plantar/posterior heel  PATHOLOGY/SPECIMEN(S): Left third toe bone culture, left partial third ray with proximal margin marked and purple ink.  INDICATIONS:   Alexander Alvarado is a 79 y.o. male who presents with a nonhealing ulceration to the posterior/plantar aspect of the left heel and gangrenous changes to the left third toe with bone exposed.  Patient was subsequently admitted to [facility] for this particular issue.  Patient was seen by vascular surgery service who performed revascularization procedure.  X-ray was taken also the left foot showing osteomyelitic changes to the left third toe.  Discussed all treatment options with the patient both conservative and surgical attempts at correction clean potential risks and complications of surgical intervention at this time patient is elected for surgical procedure consisting of left partial third ray amputation and posterior/plantar heel debridement.  Discussed with patient that the vascular service said that the blood flow to the foot is very poor even after the revascularization procedure and that he could require more proximal amputation  in the future if these distal amputations do not heal.  Patient understands would like to proceed.  Discussed also with son in detail..  DESCRIPTION: After obtaining full informed written consent, the patient was brought back to the operating room and placed supine upon the operating table.  The patient received IV antibiotics prior to induction.  14 cc of half percent Marcaine plain was injected equally about the left third ray and posterior/plantar heel wound.  After obtaining adequate anesthesia, the patient was prepped and draped in the standard fashion.  Attention was directed to the left third ray and digit where a diamond/2 semielliptical incisions were made starting over the dorsal aspect of the MPJ extending over the medial and lateral aspects of the base the proximal phalanx and extending proximally plantarly to the third metatarsal phalangeal joint area.  The incision was made straight to bone.  The third metatarsal phalangeal joint was identified and the collateral and suspensory ligaments as well as the capsule and extensor tendon as well as connections to the flexor tendon and plantar plate were released and the toe was disarticulated and passed off the operative site.  The toe appeared to be crumbling and very soft in nature consistent with osteomyelitic changes.  A bone culture was taken from this and sent off as left third toe bone culture.  The third metatarsal head appeared to be fairly viable overall but appeared to have some damage to the articular cartilage concerning for further bone infection.  At this time sagittal bone saw was then used to resect the head of the third metatarsal at the operative site and it was passed off the operative site with the proximal margin marked in purple ink and the rest of the specimen was passed off as  left third ray with proximal margin marked.  Any fibrotic nonviable tissue was resected and passed off the operative site.  The extensor and flexor tendons  were cut as far proximally as possible.  The surgical site was flushed with copious amounts normal sterile saline.  The deeper subcutaneous tissue and capsular tissues were reapproximated well coapted with 3-0 Vicryl.  The skin was then reapproximated well coapted with 3-0 nylon combination of simple and horizontal mattress type stitching.  Overall the amputation site really did not bleed that much and his wound healing at this particular area will be questionable at best.  Attention was then directed to the posterior aspect left heel were 100% excisional subcutaneous wound debridement was performed to the wound which measured approximately 1 cm x 0.5 cm x 0.2 cm and the measurement appeared to be the same post debridement as compared to predebridement, this debridement was performed with a 15 blade and curette.  Hemostasis was achieved with compression.  The wound bed appeared to be healthy and granular after debridement.  A postoperative dressing was applied consisting of Xeroform to the ulceration and incision site followed by 4 x 4 gauze, ABD, Kerlix, Ace wrap.  The patient tolerated the procedure and anesthesia well was transferred to recovery room vital signs stable vascular status remaining still somewhat questionable to the left foot but unchanged since prior to the procedure.  The patient will be placed back into the inpatient room with the following instructions: Keep surgical dressings clean, dry, and intact, okay to be partial weightbearing to the left foot but try to stay off the left foot as much possible, take postoperative pain medication and antibiotics as prescribed, and we will continue to follow-up with patient after the procedure until discharge.  We will likely see patient 1 more time either on Thursday or Friday to change his dressing and can discharge after if medically stable.  COMPLICATIONS: None  CONDITION: Good, stable  Caroline More, DPM

## 2020-01-27 NOTE — Anesthesia Procedure Notes (Signed)
Procedure Name: LMA Insertion Date/Time: 01/27/2020 12:29 PM Performed by: Caryl Asp, CRNA Pre-anesthesia Checklist: Patient identified, Patient being monitored, Timeout performed, Emergency Drugs available and Suction available Patient Re-evaluated:Patient Re-evaluated prior to induction Oxygen Delivery Method: Circle system utilized Preoxygenation: Pre-oxygenation with 100% oxygen Induction Type: IV induction Ventilation: Mask ventilation without difficulty LMA: LMA inserted LMA Size: 4.0 Tube type: Oral Number of attempts: 1 Placement Confirmation: positive ETCO2 and breath sounds checked- equal and bilateral Tube secured with: Tape Dental Injury: Teeth and Oropharynx as per pre-operative assessment

## 2020-01-27 NOTE — Transfer of Care (Signed)
Immediate Anesthesia Transfer of Care Note  Patient: Alexander Alvarado  Procedure(s) Performed: AMPUTATION RAY Partial 3rd toe amputation, Left heel debridement (Left Foot)  Patient Location: PACU  Anesthesia Type:General  Level of Consciousness: drowsy  Airway & Oxygen Therapy: Patient Spontanous Breathing and Patient connected to face mask oxygen  Post-op Assessment: Report given to RN and Post -op Vital signs reviewed and stable  Post vital signs: Reviewed and stable  Last Vitals:  Vitals Value Taken Time  BP 125/74 01/27/20 1330  Temp 36.7 C 01/27/20 1330  Pulse 65 01/27/20 1332  Resp 13 01/27/20 1332  SpO2 99 % 01/27/20 1332  Vitals shown include unvalidated device data.  Last Pain:  Vitals:   01/27/20 1330  TempSrc:   PainSc: Asleep      Patients Stated Pain Goal: 2 ([id number])  Complications: No apparent anesthesia complications

## 2020-01-28 ENCOUNTER — Inpatient Hospital Stay: Payer: Medicare HMO

## 2020-01-28 LAB — BASIC METABOLIC PANEL
Anion gap: 7 (ref 5–15)
BUN: 17 mg/dL (ref 8–23)
CO2: 25 mmol/L (ref 22–32)
Calcium: 8.4 mg/dL — ABNORMAL LOW (ref 8.9–10.3)
Chloride: 104 mmol/L (ref 98–111)
Creatinine, Ser: 0.58 mg/dL — ABNORMAL LOW (ref 0.61–1.24)
GFR calc Af Amer: >60 mL/min (ref >60)
GFR calc non Af Amer: >60 mL/min (ref >60)
Glucose, Bld: 139 mg/dL — ABNORMAL HIGH (ref 70–99)
Potassium: 4.4 mmol/L (ref 3.5–5.1)
Sodium: 136 mmol/L (ref 135–145)

## 2020-01-28 LAB — CBC
HCT: 46.1 % (ref 39.0–52.0)
Hemoglobin: 15 g/dL (ref 13.0–17.0)
MCH: 30.7 pg (ref 26.0–34.0)
MCHC: 32.5 g/dL (ref 30.0–36.0)
MCV: 94.5 fL (ref 80.0–100.0)
Platelets: 313 10*3/uL (ref 150–400)
RBC: 4.88 MIL/uL (ref 4.22–5.81)
RDW: 13.8 % (ref 11.5–15.5)
WBC: 16.6 10*3/uL — ABNORMAL HIGH (ref 4.0–10.5)
nRBC: 0 % (ref 0.0–0.2)

## 2020-01-28 MED ORDER — CLOPIDOGREL BISULFATE 75 MG PO TABS
75.0000 mg | ORAL_TABLET | Freq: Every day | ORAL | Status: DC
  Administered 2020-01-28 – 2020-02-01 (×5): 75 mg via ORAL
  Filled 2020-01-28 (×5): qty 1

## 2020-01-28 MED ORDER — ALPRAZOLAM 0.25 MG PO TABS
0.2500 mg | ORAL_TABLET | Freq: Once | ORAL | Status: AC
  Administered 2020-01-28: 0.25 mg via ORAL
  Filled 2020-01-28: qty 1

## 2020-01-28 MED ORDER — ENOXAPARIN SODIUM 40 MG/0.4ML ~~LOC~~ SOLN
40.0000 mg | SUBCUTANEOUS | Status: DC
  Administered 2020-01-28 – 2020-01-31 (×3): 40 mg via SUBCUTANEOUS
  Filled 2020-01-28 (×4): qty 0.4

## 2020-01-28 NOTE — Evaluation (Signed)
Occupational Therapy Re-Evaluation Patient Details Name: Alexander Alvarado MRN: [id number] DOB: [birth year] Today's Date: 01/28/2020    History of Present Illness Alexander Alvarado is a 79 y.o. male who presents with multiple medical issues.  Patient arrived via EMS due to left-sided weakness and concern for potential CVA.  Patient was also noted to have a ulceration to the dorsal aspect of the left third toe with bone exposed as well as posterior aspect left heel.  Patient notes symptoms have been present for for about 2 months.  States they have progressively worsened over the last 2 months, becoming larger in the size and foul-smelling. Pt now s/p  angiogram with vascular 01/26/20 and s/p L third ray partial amputation with order for post-op shoe and partial WB as of 01/27/20.  PMH includes CVA with residual L sided weakness, PVD, HTN, HLD, R hip hemiarthroplastly.   Clinical Impression   Pt seen for OT re-evaluation this date to assess appropriateness of goals in setting of recent angiogram and L third ray partial amputation this week. Pt agreeable to OT session and very participatory throughout, however, does express on more that one occasion, feeling "useless" and feeling he is at the end of his life and wishes it would go ahead and end. Pt does not appear upset when expressing these feelings, just matter-of-fact. OT notified RN about concerns regarding these feelings. Pt requires MIN A for sup<>sit this date which is slightly increased assistance versus initial evaluation, appears to require increased assistance d/t increased L foot pain. Pt demos Fair seated balance at EOB, but with some L lateral lean that he requires prop for support on L side and MIN verbal cues for self-righting to come to midline sitting. Pt participates in grooming tasks while seated EOB and requires setup and increased time d/t decreased ROM/grasp with L hand. Pt requires MOD A for LB dressing in long sitting at bed level this date.  Transfers were not assessed on OT re-evaluation at this time as pt is without the ordered post-op shoe. RN is aware. Anticipate SNF is still most appropriate d/c recommendation for pt given current fxl status relative to PLOF and living situation.     Follow Up Recommendations  SNF    Equipment Recommendations  Other (comment)(defer to next venue of care)    Recommendations for Other Services       Precautions / Restrictions Precautions Precautions: Fall Precaution Comments: High Fall Restrictions Weight Bearing Restrictions: Yes LLE Weight Bearing: Partial weight bearing Other Position/Activity Restrictions: prevalon boot to L LE in bed, post-op shoe to L LE for OOB activity      Mobility Bed Mobility Overal bed mobility: Needs Assistance Bed Mobility: Rolling;Supine to Sit;Sit to Supine Rolling: Min assist   Supine to sit: Min assist Sit to supine: Min assist      Transfers                 General transfer comment: unable to assess t/f at this time d/t no post-op shoe in room, RN aware    Balance Overall balance assessment: Needs assistance Sitting-balance support: Feet supported;Single extremity supported Sitting balance-[name]: Fair Sitting balance - Comments: difficulty maintaining midline in EOB sitting, L lateral lean consistently requiring MIN verbal cues to correct/right self.                                   ADL either performed or  assessed with clinical judgement   ADL Overall ADL's : Needs assistance/impaired Eating/Feeding: Independent   Grooming: Oral care;Set up;Sitting           Upper Body Dressing : Minimal assistance;Sitting   Lower Body Dressing: Moderate assistance;Bed level Lower Body Dressing Details (indicate cue type and reason): long sitting, OT threads sock and pt uses cross-leg technique to finish it.       Toileting - Clothing Manipulation Details (indicate cue type and reason): unable to trial t/f to  [facility] at this time d/t no post-op shoe in room, RN aware. Pt currently using bed pan, MIN A for lateral rolling technique.       General ADL Comments: Pt limited by pain and, on this re-eval date, limited by not having post-op shoe available for transfer trials.     Vision Baseline Vision/History: Wears glasses Wears Glasses: At all times Patient Visual Report: No change from baseline       Perception     Praxis      Pertinent Vitals/Pain Pain Assessment: Faces Faces Pain Scale: Hurts a little bit Pain Location: L heel Pain Descriptors / Indicators: Burning Pain Intervention(s): Limited activity within patient's tolerance;Monitored during session     Hand Dominance Right   Extremity/Trunk Assessment Upper Extremity Assessment Upper Extremity Assessment: RUE deficits/detail;LUE deficits/detail RUE Deficits / Details: WFLs LUE Deficits / Details: shld flexion to 90 degrees, grip 3+/5, FMC delayed. LUE Coordination: decreased fine motor;decreased gross motor           Communication Communication Communication: No difficulties   Cognition Arousal/Alertness: Awake/alert Behavior During Therapy: WFL for tasks assessed/performed;Flat affect Overall Cognitive Status: Within Functional Limits for tasks assessed                                 General Comments: Pt engaged throughout session, but does, on more than one occasion, calmly express that he does not want to live any longer. He does not feel upset about this, but does indicate that he feels he is at the end of his life. He expresses that he feels his useful time on this earth has passed.   General Comments       Exercises Other Exercises Other Exercises: Education re: importance of posture in EOB sitting to increase surface area for increased quality of breath   Shoulder Instructions      Home Living Family/patient expects to be discharged to:: Private residence Living Arrangements:  Alone Available Help at Discharge: Friend(s);Neighbor;Available PRN/intermittently Type of Home: Apartment Home Access: Level entry     Home Layout: One level     Bathroom Shower/Tub: Tub/shower unit;Curtain   Biochemist, clinical: Standard     Home Equipment: Walker - 4 wheels;Grab bars - toilet;Grab bars - tub/shower;Hand held shower head;Wheelchair - manual   Additional Comments: Pt was living at an assisted living facility. Has 2 adult children, 1 lives in [place] the other in [place]. Pt states he is unsure if he will go back to ALF or if he and his children will make other arrangements.      Prior Functioning/Environment Level of Independence: Independent with assistive device(s)        Comments: Mod Ind amb with rollator HH distances.  Pt reported "I can't take a step without my walker". Friends assist with shopping and bring him food. States he rarely leaves the house.        OT Problem List:  Decreased strength;Decreased coordination;Decreased activity tolerance;Decreased safety awareness;Impaired balance (sitting and/or standing);Decreased knowledge of use of DME or AE;Decreased knowledge of precautions;Impaired UE functional use;Decreased range of motion      OT Treatment/Interventions: Self-care/ADL training;Therapeutic exercise;Therapeutic activities;DME and/or AE instruction;Patient/family education;Balance training    OT Goals(Current goals can be found in the care plan section) Acute Rehab OT Goals Patient Stated Goal: to be useful again OT Goal Formulation: With patient Time For Goal Achievement: 02/11/20 Potential to Achieve Goals: Good  OT Frequency: Min 2X/week   Barriers to D/C: Decreased caregiver support;Inaccessible home environment          Co-evaluation              AM-PAC OT "6 Clicks" Daily Activity     Outcome Measure Help from another person eating meals?: A Little Help from another person taking care of personal grooming?: A Little Help from  another person toileting, which includes using toliet, bedpan, or urinal?: A Lot Help from another person bathing (including washing, rinsing, drying)?: A Lot Help from another person to put on and taking off regular upper body clothing?: A Little Help from another person to put on and taking off regular lower body clothing?: A Lot 6 Click Score: 15   End of Session Nurse Communication: Mobility status  Activity Tolerance: Patient tolerated treatment well;Patient limited by pain;Other (comment)(limited by no post-op shoe) Patient left: in bed;with call bell/phone within reach;Other (comment)  OT Visit Diagnosis: Other abnormalities of gait and mobility (R26.89);Muscle weakness (generalized) (M62.81)                Time: [id number] OT Time Calculation (min): 40 min Charges:  OT General Charges $OT Visit: 1 Visit OT Evaluation $OT Re-eval: 1 Re-eval OT Treatments $Self Care/Home Management : 8-22 mins $Therapeutic Activity: 8-22 mins  Gerrianne Scale, MS, OTR/L ascom [phone] 01/28/20, 4:48 PM

## 2020-01-28 NOTE — Care Management Important Message (Signed)
Important Message  Patient Details  Name: Alexander Alvarado MRN: [id number] Date of Birth: [birth year]   Medicare Important Message Given:  Yes     Juliann Pulse A Orbin Mayeux 01/28/2020, 10:42 AM

## 2020-01-28 NOTE — Progress Notes (Signed)
[facility] Daily Progress Note   Subjective: 01/26/20: 1. Introduction catheter into left lower extremity 3rd order catheter placement  2. Contrast injection left lower extremity for distal runoff  3. Percutaneous transluminal angioplasty and stent placement left femoral-below-knee popliteal bypass graft 4. Percutaneous transluminal angioplasty left peroneal  5. Percutaneous transluminal angioplasty and stent placement left external iliac artery.              6.  Star close closure right common femoral arteriotomy  Without complaint this AM with exception of some left foot pain.  Objective: Vitals:   01/27/20 2116 01/27/20 2325 01/28/20 0333 01/28/20 0810  BP: 130/74 122/70 124/68 (!) 113/47  Pulse:  65 79 (!) 50  Resp:  18 18 17   Temp:  98.5 F (36.9 C) 98.2 F (36.8 C) 97.6 F (36.4 C)  TempSrc:  Oral Oral Axillary  SpO2:  91% 98% 93%  Weight:      Height:        Intake/Output Summary (Last 24 hours) at 01/28/2020 1352 Last data filed at 01/28/2020 1035 Gross per 24 hour  Intake 1849.88 ml  Output 1000 ml  Net 849.88 ml    Physical Exam: A&Ox3, NAD CV: RRR Pulmonary: CTA Bilaterally Abdomen: Soft, Nontender, Nondistended Right Groin:  Access Site: clean and dry. Vascular:  Left lower extremity: Thigh soft. Calf soft. Extremities warm distally. Did not remove podiatry OR dressing.   Laboratory: CBC    Component Value Date/Time   WBC 16.6 (H) 01/28/2020 0453   HGB 15.0 01/28/2020 0453   HGB 20.6 (HH) 02/10/2019 0852   HCT 46.1 01/28/2020 0453   HCT 59.9 (H) 02/10/2019 0852   PLT 313 01/28/2020 0453   PLT 189 02/10/2019 0852   BMET    Component Value Date/Time   NA 136 01/28/2020 0453   NA 138 02/10/2019 0852   NA 138 07/15/2012 0447   K 4.4 01/28/2020 0453   K 3.3 (L) 07/15/2012 0447   CL 104 01/28/2020 0453   CL 104 07/15/2012 0447   CO2 25 01/28/2020 0453   CO2  23 07/15/2012 0447   GLUCOSE 139 (H) 01/28/2020 0453   GLUCOSE 79 07/15/2012 0447   BUN 17 01/28/2020 0453   BUN 9 02/10/2019 0852   BUN 18 07/15/2012 0447   CREATININE 0.58 (L) 01/28/2020 0453   CREATININE 0.79 07/15/2012 0447   CALCIUM 8.4 (L) 01/28/2020 0453   CALCIUM 8.7 07/15/2012 0447   GFRNONAA >60 01/28/2020 0453   GFRNONAA >60 07/15/2012 0447   GFRAA >60 01/28/2020 0453   GFRAA >60 07/15/2012 0447   Assessment/Planning: The patient is a 79 year old male who has known peripheral artery disease who presents to the [facility] emergency department with a large ulceration and MRI demonstrating osteomyelitis to the left foot now postop day two - left lower extremity angiogram   1) PAD: Had stent placement - on ASA and statin, will add Plavix Will continue to follow in the outpatient setting.  Discussed with Tamera Stands PA-C 01/28/2020 1:52 PM

## 2020-01-28 NOTE — Progress Notes (Signed)
PROGRESS NOTE  Alexander Alvarado  [id number] DOB: 1940-12-23 DOA: 01/18/2020 PCP: Glean Hess, MD   Brief Narrative: Alexander Alvarado Hunteris a 79 y.o.malewith medical history significant ofhypertension, peripheral vascular diseases/pamputation of second toe and CVA  Brought to ED with concern of left-sided weakness which started this morning.Per patient he has some residual weakness on left side but when he got up this morning he was unable to move his left side.He normally walks with the help of walker since his prior stroke,he could not grasp a cup or walk .Hissymptoms started gradually improving as the day passes. He denies any facial droop, denies any change in his vision. He has left-sided drooping eyelid for a long time.  He was also complaining of worsening pain in his left foot. He has chronic wounds in his left third toe and heel. Apparently seen by in wound care nurse at home and getting treatment with no relief. He denies using any antibiotics or seen a physician for that. He has his second toe amputated due to osteo in the past. He denies any fever or chills. No upper respiratory symptoms. He lives alone and denies any sick contacts. He was also complaining of some increased urinary urgency and frequency. Denies any dysuria, hematuria or lower abdominal pain.  Assessment & Plan: Active Problems:   PAD (peripheral artery disease) (HCC)   Osteomyelitis of left foot (HCC)   Malnutrition of moderate degree  Left-sided weakness, possible stroke, history of stroke with left-sided deficits: Persistent deficits consistent with recurrent stroke. Though CT head initially without abnormality. LDL 42. - Pt declines MRI/MRA as well as repeat CT head due to inability to lay flat due to sinus issues. - Aspirin, statin, plavix and risk factor modification to continue. - Continue PT, OT  Left 3rd toe osteomyelitis and left heel ulceration complicated by PAD:  - s/p  revascularization with stents in LLE on 4/6, then left 3rd toe amputation, left heel ulcer debridement 4/7 by Dr. Luana Shu. - Continue current antimicrobials as recommended by ID, will tailor to operative culture results, ok to use po per ID previously.  - Continue gabapentin for neuropathic component, prn tramadol, morphine. Continue tylenol, toradol. - Continue ASA, added plavix, continue statin.   Prediabetes:  - CBGs at inpatient goal without insulin. HbA1c consistent with prediabetes at 5.8%.  Tobacco use: Long term.  - Cessation counseling provided, drawing line between his use and current vascular issues.   Acute hypoxic respiratory failure: Unclear etiology though patient has long history of tobacco use and has required supplemental oxygen in past admissions, had aspiration pneumonia. This has resolved.  - Consider sleep study as outpatient.  HTN:  - Restarted home amlodipine, hydralazine  Urinary urgency, frequency:  - Negative UA.  - Bladder scan prn.  Insomnia:  - Continue trazodone 150mg , melatonin 5mg   RN Pressure Injury Documentation: Pressure Injury 01/20/20 Toe (Comment  which one) Anterior;Left Black dried wound (Active)  01/20/20 0830  Location: Toe (Comment  which one)  Location Orientation: Anterior;Left  Staging:   Wound Description (Comments): Black dried wound  Present on Admission:      Pressure Injury 01/20/20 Heel Left (Active)  01/20/20 0830  Location: Heel  Location Orientation: Left  Staging:   Wound Description (Comments):   Present on Admission:    Moderate protein calorie malnutrition. Supplement protein as able to optimize wound healing.   DVT prophylaxis: Lovenox (restart 4/8) Code Status: DNR Family Communication: None at bedside.  Disposition Plan: Patient from home,  will likely require SNF at discharge post operatively. SARS-CoV-2 ordered in anticipation of DC in next 24-48hrs. Pending culture results and podiatry recommendations  regarding timing of discharge   Consultants:   Podiatry, Dr. Luana Shu  Vascular surgery  Infectious diseases  Procedures:   None  Antimicrobials:  Vancomycin, ceftriaxone, flagyl  Subjective: Pain in left heel, none elsewhere, this is improved with pain medications as ordered  Objective: Vitals:   01/27/20 2116 01/27/20 2325 01/28/20 0333 01/28/20 0810  BP: 130/74 122/70 124/68 (!) 113/47  Pulse:  65 79 (!) 50  Resp:  18 18 17   Temp:  98.5 F (36.9 C) 98.2 F (36.8 C) 97.6 F (36.4 C)  TempSrc:  Oral Oral Axillary  SpO2:  91% 98% 93%  Weight:      Height:        Intake/Output Summary (Last 24 hours) at 01/28/2020 1546 Last data filed at 01/28/2020 1035 Gross per 24 hour  Intake 1849.88 ml  Output 1000 ml  Net 849.88 ml   Filed Weights   01/18/20 1244 01/19/20 1610  Weight: 68 kg 73.8 kg   Gen: Chronically ill-appearing male in no distress Pulm: Nonlabored, clear. CV: Regular rate and rhythm. No murmur, rub, or gallop. No JVD, no dependent edema. GI: Abdomen soft, non-tender, non-distended, with normoactive bowel sounds.  Ext: Warm, LLE warm, dry with soft calf compartment, dressing not removed today. Skin: No new rashes, lesions or ulcers on visualized skin. Neuro: Alert and oriented. No focal neurological deficits. Psych: Judgement and insight appear fair. Mood euthymic & affect congruent. Behavior is appropriate.    Data Reviewed: I have personally reviewed following labs and imaging studies  CBC: Recent Labs  Lab 01/22/20 0500 01/27/20 0500 01/28/20 0453  WBC 11.0* 15.0* 16.6*  HGB 15.8 15.7 15.0  HCT 48.7 47.3 46.1  MCV 94.0 92.6 94.5  PLT 263 318 [id number]   Basic Metabolic Panel: Recent Labs  Lab 01/22/20 0500 01/22/20 0500 01/23/20 0419 01/23/20 0419 01/24/20 0022 01/25/20 0407 01/26/20 0502 01/27/20 0500 01/28/20 0453  NA 139  --  137  --   --   --  137 137 136  K 3.2*  --  3.7  --   --   --  4.0 4.1 4.4  CL 102  --  102  --   --   --   103 102 104  CO2 27  --  27  --   --   --  27 26 25   GLUCOSE 121*  --  91  --   --   --  96 148* 139*  BUN 12  --  11  --   --   --  10 13 17   CREATININE 0.65   < > 0.62   < > 0.57* 0.61 0.54* 0.52* 0.58*  CALCIUM 8.8*  --  8.5*  --   --   --  8.6* 8.5* 8.4*  MG 1.8  --   --   --   --   --   --  2.0  --    < > = values in this interval not displayed.   Liver Function Tests: Recent Labs  Lab 01/22/20 0500  AST 17  ALT 12  ALKPHOS 82  BILITOT 0.6  PROT 6.0*  ALBUMIN 2.9*   CBG: Recent Labs  Lab 01/21/20 1734 01/21/20 2129  GLUCAP 136* 147*   Urine analysis:    Component Value Date/Time   COLORURINE YELLOW (A) 01/19/2020 1516  APPEARANCEUR CLEAR (A) 01/19/2020 [place] 07/14/2012 2232   LABSPEC 1.010 01/19/2020 1516   LABSPEC 1.024 07/14/2012 2232   PHURINE 7.0 01/19/2020 1516   GLUCOSEU NEGATIVE 01/19/2020 1516   GLUCOSEU Negative 07/14/2012 2232   HGBUR NEGATIVE 01/19/2020 1516   [place] 01/19/2020 1516   BILIRUBINUR neg 02/10/2019 0847   BILIRUBINUR Negative 07/14/2012 2232   [place] 01/19/2020 1516   PROTEINUR NEGATIVE 01/19/2020 1516   UROBILINOGEN 0.2 02/10/2019 0847   NITRITE NEGATIVE 01/19/2020 1516   LEUKOCYTESUR SMALL (A) 01/19/2020 1516   LEUKOCYTESUR Negative 07/14/2012 2232   Recent Results (from the past 240 hour(s))  SARS CORONAVIRUS 2 (TAT 6-24 HRS) Nasopharyngeal Nasopharyngeal Swab     Status: None   Collection Time: 01/18/20  4:26 PM   Specimen: Nasopharyngeal Swab  Result Value Ref Range Status   SARS Coronavirus 2 NEGATIVE NEGATIVE Final    Comment: (NOTE) SARS-CoV-2 target nucleic acids are NOT DETECTED. The SARS-CoV-2 RNA is generally detectable in upper and lower respiratory specimens during the acute phase of infection. Negative results do not preclude SARS-CoV-2 infection, do not rule out co-infections with other pathogens, and should not be used as the sole basis for treatment or other  patient management decisions. Negative results must be combined with clinical observations, patient history, and epidemiological information. The expected result is Negative. Fact Sheet for Patients: SugarRoll.be Fact Sheet for Healthcare Providers: https://www.woods-mathews.com/ This test is not yet approved or cleared by the [place] FDA and  has been authorized for detection and/or diagnosis of SARS-CoV-2 by FDA under an Emergency Use Authorization (EUA). This EUA will remain  in effect (meaning this test can be used) for the duration of the COVID-19 declaration under Section 56 4(b)(1) of the Act, 21 U.S.C. section 360bbb-3(b)(1), unless the authorization is terminated or revoked sooner. Performed at [facility], [place] [address], [place], [place] [address]   Wound or Superficial Culture     Status: None   Collection Time: 01/18/20  4:53 PM   Specimen: Wound  Result Value Ref Range Status   Specimen Description   Final    WOUND Performed at [facility], [address], [place], [place] [address]    Special Requests   Final    LEFT 3RD TOE Performed at [facility], [place], [place] [address]    Gram Stain   Final    NO WBC SEEN MODERATE GRAM POSITIVE COCCI MODERATE GRAM POSITIVE RODS RARE GRAM NEGATIVE RODS    Culture   Final    FEW METHICILLIN RESISTANT STAPHYLOCOCCUS AUREUS WITHIN MIXED ORGANISMS Performed at [facility], [address] [place], [place] [address]    Report Status 01/21/2020 FINAL  Final   Organism ID, Bacteria METHICILLIN RESISTANT STAPHYLOCOCCUS AUREUS  Final      Susceptibility   Methicillin resistant staphylococcus aureus - MIC*    CIPROFLOXACIN >=8 RESISTANT Resistant     ERYTHROMYCIN >=8 RESISTANT Resistant     GENTAMICIN <=0.5 SENSITIVE Sensitive     OXACILLIN >=4 RESISTANT Resistant     TETRACYCLINE <=1 SENSITIVE Sensitive     VANCOMYCIN <=0.5  SENSITIVE Sensitive     TRIMETH/SULFA <=10 SENSITIVE Sensitive     CLINDAMYCIN <=0.25 SENSITIVE Sensitive     RIFAMPIN <=0.5 SENSITIVE Sensitive     Inducible Clindamycin NEGATIVE Sensitive     * FEW METHICILLIN RESISTANT STAPHYLOCOCCUS AUREUS  Wound or Superficial Culture     Status: None  Collection Time: 01/18/20  4:53 PM   Specimen: Wound  Result Value Ref Range Status   Specimen Description   Final    WOUND Performed at [facility], [address] [place], [place] [address]    Special Requests   Final    LEFT HEEL Performed at [facility], [address] [place], [place] [address]    Gram Stain   Final    NO WBC SEEN RARE GRAM POSITIVE COCCI Performed at [facility], [place] [address] [place], [place] [address]    Culture FEW METHICILLIN RESISTANT STAPHYLOCOCCUS AUREUS  Final   Report Status 01/21/2020 FINAL  Final   Organism ID, Bacteria METHICILLIN RESISTANT STAPHYLOCOCCUS AUREUS  Final      Susceptibility   Methicillin resistant staphylococcus aureus - MIC*    CIPROFLOXACIN >=8 RESISTANT Resistant     ERYTHROMYCIN >=8 RESISTANT Resistant     GENTAMICIN <=0.5 SENSITIVE Sensitive     OXACILLIN >=4 RESISTANT Resistant     TETRACYCLINE <=1 SENSITIVE Sensitive     VANCOMYCIN 1 SENSITIVE Sensitive     TRIMETH/SULFA <=10 SENSITIVE Sensitive     CLINDAMYCIN <=0.25 SENSITIVE Sensitive     RIFAMPIN <=0.5 SENSITIVE Sensitive     Inducible Clindamycin NEGATIVE Sensitive     * FEW METHICILLIN RESISTANT STAPHYLOCOCCUS AUREUS  MRSA PCR Screening     Status: Abnormal   Collection Time: 01/25/20 10:00 PM   Specimen: Nasopharyngeal  Result Value Ref Range Status   MRSA by PCR POSITIVE (A) NEGATIVE Final    Comment:        The GeneXpert MRSA Assay (FDA approved for NASAL specimens only), is one component of a comprehensive MRSA colonization surveillance program. It is not intended to diagnose MRSA infection nor to guide or monitor treatment  for MRSA infections. RESULT CALLED TO, READ BACK BY AND VERIFIED WITH: Azzie Almas RN [phone] 01/25/20 HNM Performed at [facility], [address] [place], [place] [address]   Aerobic/Anaerobic Culture (surgical/deep wound)     Status: None (Preliminary result)   Collection Time: 01/27/20 12:52 PM   Specimen: PATH Digit amputation; Tissue  Result Value Ref Range Status   Specimen Description   Final    TOE GANGRENE LEFT 3RD TOE Performed at [facility], [address], [place], [place] [address]    Special Requests   Final    NONE Performed at [facility], [place], [place] [address]    Gram Stain   Final    ABUNDANT WBC PRESENT,BOTH PMN AND MONONUCLEAR FEW GRAM POSITIVE COCCI FEW GRAM POSITIVE RODS    Culture   Final    FEW STAPHYLOCOCCUS AUREUS FEW ENTEROCOCCUS FAECALIS SUSCEPTIBILITIES TO FOLLOW Performed at [facility], [place] [address] [place], [place] [address]    Report Status PENDING  Incomplete      Radiology Studies: No results found.  Scheduled Meds: . amLODipine  10 mg Oral Daily  . aspirin EC  81 mg Oral Daily  . atorvastatin  20 mg Oral QHS  . Chlorhexidine Gluconate Cloth  6 each Topical Q0600  . clopidogrel  75 mg Oral Daily  . feeding supplement (ENSURE ENLIVE)  237 mL Oral TID BM  . folic acid  1 mg Oral Daily  . gabapentin  100 mg Oral TID  . hydrALAZINE  25 mg Oral BID  . melatonin  5 mg Oral QHS  . metroNIDAZOLE  500 mg Oral Q8H  .  multivitamin-lutein  1 capsule Oral Daily  . mupirocin ointment  1 application Nasal BID  . pantoprazole  40 mg Oral Daily  . traZODone  100 mg Oral QHS  . vitamin B-12  1,000 mcg Oral Daily   Continuous Infusions: . sodium chloride 1,000 mL (01/26/20 1151)  . sodium chloride    . sodium chloride 50 mL/hr at 01/28/20 1422  . vancomycin 750 mg (01/28/20 1424)     LOS: 10 days   Time spent: 25 minutes.  Patrecia Pour, MD [facility] [email or web address] 01/28/2020, 3:46 PM

## 2020-01-28 NOTE — TOC Progression Note (Signed)
Transition of Care [facility]) - Progression Note    Patient Details  Name: Alexander Alvarado MRN: [id number] Date of Birth: 1941-05-05  Transition of [facility] CM/SW Contact  Su Hilt, RN Phone Number: 01/28/2020, 2:41 PM  Clinical Narrative:    Patient had toe amputation yesterday, will be going to [facility] once medically stable, [facility] to start authm Covid test is needed   Expected Discharge Plan: [place]    Expected Discharge Plan and Services Expected Discharge Plan: [place]   Discharge Planning Services: CM Consult Post Acute Care Choice: [place] Living arrangements for the past 2 months: Single Family Home                                       Social Determinants of Health (SDOH) Interventions    Readmission Risk Interventions No flowsheet data found.

## 2020-01-29 LAB — CBC
HCT: 45.4 % (ref 39.0–52.0)
Hemoglobin: 14.6 g/dL (ref 13.0–17.0)
MCH: 30.5 pg (ref 26.0–34.0)
MCHC: 32.2 g/dL (ref 30.0–36.0)
MCV: 94.8 fL (ref 80.0–100.0)
Platelets: 307 10*3/uL (ref 150–400)
RBC: 4.79 MIL/uL (ref 4.22–5.81)
RDW: 13.9 % (ref 11.5–15.5)
WBC: 12.2 10*3/uL — ABNORMAL HIGH (ref 4.0–10.5)
nRBC: 0 % (ref 0.0–0.2)

## 2020-01-29 LAB — BASIC METABOLIC PANEL
Anion gap: 7 (ref 5–15)
BUN: 20 mg/dL (ref 8–23)
CO2: 27 mmol/L (ref 22–32)
Calcium: 8.6 mg/dL — ABNORMAL LOW (ref 8.9–10.3)
Chloride: 105 mmol/L (ref 98–111)
Creatinine, Ser: 0.62 mg/dL (ref 0.61–1.24)
GFR calc Af Amer: >60 mL/min (ref >60)
GFR calc non Af Amer: >60 mL/min (ref >60)
Glucose, Bld: 84 mg/dL (ref 70–99)
Potassium: 4.2 mmol/L (ref 3.5–5.1)
Sodium: 139 mmol/L (ref 135–145)

## 2020-01-29 LAB — SURGICAL PATHOLOGY

## 2020-01-29 LAB — SARS CORONAVIRUS 2 (TAT 6-24 HRS): SARS Coronavirus 2: NEGATIVE

## 2020-01-29 MED ORDER — SULFAMETHOXAZOLE-TRIMETHOPRIM 800-160 MG PO TABS
1.0000 | ORAL_TABLET | Freq: Two times a day (BID) | ORAL | Status: DC
  Administered 2020-01-29 – 2020-02-01 (×7): 1 via ORAL
  Filled 2020-01-29 (×7): qty 1

## 2020-01-29 NOTE — Evaluation (Signed)
Physical Therapy Re-Evaluation Patient Details Name: Alexander Alvarado MRN: [id number] DOB: 1941/03/25 Today's Date: 01/29/2020   History of Present Illness  Pt is a 79 y.o. male who presents with multiple medical issues.  Patient arrived via EMS due to left-sided weakness and concern for potential CVA.  Patient was also noted to have a ulceration to the dorsal aspect of the left third toe with bone exposed as well as posterior aspect left heel.  Patient notes symptoms have been present for for about 2 months.  States they have progressively worsened over the last 2 months, becoming larger in the size and foul-smelling. Pt now s/p  angiogram with vascular 01/26/20 and s/p L third ray partial amputation with order for post-op shoe and partial WB as of 01/27/20.  PMH includes CVA with residual L sided weakness, PVD, HTN, HLD, R hip hemiarthroplastly.    Clinical Impression  Pt required encouragement to participate during the session and would frequently state "I can't do that" even as he was actively participating in the task he was saying he couldn't do.  Ultimately the patient did participate throughout the session requiring min A for most functional tasks.  Pt was able to take several small steps at the EOB with min A for stability and with good compliance with PWB status.  Pt will benefit from PT services in a SNF setting upon discharge to safely address deficits listed in patient problem list for decreased caregiver assistance and eventual return to PLOF.      Follow Up Recommendations SNF    Equipment Recommendations  Rolling walker with 5" wheels    Recommendations for Other Services       Precautions / Restrictions Precautions Precautions: Fall Restrictions Weight Bearing Restrictions: Yes LLE Weight Bearing: Partial weight bearing Other Position/Activity Restrictions: prevalon boot to L LE in bed, post-op shoe to L LE for OOB activity      Mobility  Bed Mobility Overal bed mobility:  Needs Assistance       Supine to sit: Min assist Sit to supine: Supervision   General bed mobility comments: Min A for BLE control during sup to sit but no physical assist needed during sit to sup  Transfers Overall transfer level: Needs assistance Equipment used: Rolling walker (2 wheeled) Transfers: Sit to/from Stand Sit to Stand: From elevated surface;Min assist         General transfer comment: Mod verbal cues for increased trunk flex  Ambulation/Gait Ambulation/Gait assistance: Min assist Gait Distance (Feet): 2 Feet Assistive device: Rolling walker (2 wheeled) Gait Pattern/deviations: Step-to pattern Gait velocity: decreased   General Gait Details: Visual and verbal cues for sequencing for PWB with pt able to take several small steps to the EOB with min A for stability  Stairs            Wheelchair Mobility    Modified Rankin (Stroke Patients Only)       Balance Overall balance assessment: Needs assistance Sitting-balance support: Feet supported;Single extremity supported Sitting balance-[name]: Fair     Standing balance support: Bilateral upper extremity supported;During functional activity Standing balance-[name]: Poor Standing balance comment: Min A to prevent LOB in standing                             Pertinent Vitals/Pain Pain Assessment: 0-10 Pain Score: 8  Pain Location: L heel Pain Descriptors / Indicators: Burning Pain Intervention(s): Premedicated before session;Monitored during session;Repositioned  Home Living Family/patient expects to be discharged to:: Private residence Living Arrangements: Alone Available Help at Discharge: Friend(s);Neighbor;Available PRN/intermittently Type of Home: Apartment Home Access: Level entry     Home Layout: One level Home Equipment: Walker - 4 wheels;Grab bars - toilet;Grab bars - tub/shower;Hand held shower head;Wheelchair - manual      Prior Function Level of  Independence: Independent with assistive device(s)         Comments: Mod Ind amb with rollator HH distances.  Pt reported "I can't take a step without my walker". Friends assist with shopping and bring him food. States he rarely leaves the house.     Hand Dominance   Dominant Hand: Right    Extremity/Trunk Assessment   Upper Extremity Assessment Upper Extremity Assessment: Generalized weakness;Defer to OT evaluation    Lower Extremity Assessment Lower Extremity Assessment: Generalized weakness;LLE deficits/detail LLE Deficits / Details: Residual deficits to LLE strength from prior CVA       Communication   Communication: No difficulties  Cognition Arousal/Alertness: Awake/alert Behavior During Therapy: WFL for tasks assessed/performed;Flat affect Overall Cognitive Status: Within Functional Limits for tasks assessed                                        General Comments      Exercises Total Joint Exercises Ankle Circles/Pumps: AROM;Strengthening;Both;10 reps Quad Sets: Strengthening;Both;10 reps Gluteal Sets: Strengthening;Both;10 reps Hip ABduction/ADduction: Strengthening;Both;10 reps;AAROM;AROM Straight Leg Raises: AROM;Strengthening;Both;10 reps;AAROM Long Arc Quad: Strengthening;Both;10 reps Knee Flexion: Strengthening;Both;10 reps Other Exercises Other Exercises: HEP education for BLE APs, QS, GS, and LAQs x 10 each 5-6x/day   Assessment/Plan    PT Assessment Patient needs continued PT services  PT Problem List Decreased strength;Decreased activity tolerance;Decreased balance;Decreased mobility;Decreased knowledge of use of DME;Decreased safety awareness;Decreased knowledge of precautions;Pain       PT Treatment Interventions DME instruction;Gait training;Functional mobility training;Therapeutic activities;Therapeutic exercise;Balance training;Patient/family education    PT Goals (Current goals can be found in the Care Plan section)   Acute Rehab PT Goals Patient Stated Goal: To be able to take care of myself PT Goal Formulation: With patient Time For Goal Achievement: 02/11/20 Potential to Achieve Goals: Fair    Frequency 7X/week   Barriers to discharge Inaccessible home environment;Decreased caregiver support      Co-evaluation               AM-PAC PT "6 Clicks" Mobility  Outcome Measure Help needed turning from your back to your side while in a flat bed without using bedrails?: A Little Help needed moving from lying on your back to sitting on the side of a flat bed without using bedrails?: A Little Help needed moving to and from a bed to a chair (including a wheelchair)?: A Lot Help needed standing up from a chair using your arms (e.g., wheelchair or bedside chair)?: A Little Help needed to walk in hospital room?: Total Help needed climbing 3-5 steps with a railing? : Total 6 Click Score: 13    End of Session Equipment Utilized During Treatment: Gait belt Activity Tolerance: Patient tolerated treatment well Patient left: in bed;with call bell/phone within reach;with bed alarm set;Other (comment)(Heels floated with pillows with pt refusing prevalon boots) Nurse Communication: Mobility status;Other (comment)(Pt declined Prevalon boots) PT Visit Diagnosis: Unsteadiness on feet (R26.81);History of falling (Z91.81);Difficulty in walking, not elsewhere classified (R26.2);Muscle weakness (generalized) (M62.81);Pain Pain - Right/Left: Left Pain - part  of body: Ankle and joints of foot    Time: [id number] PT Time Calculation (min) (ACUTE ONLY): 32 min   Charges:   PT Evaluation $PT Re-evaluation: 1 Re-eval PT Treatments $Therapeutic Exercise: 8-22 mins        D. Scott Jeanmarie Mccowen PT, DPT 01/29/20, 11:10 AM

## 2020-01-29 NOTE — Progress Notes (Signed)
PROGRESS NOTE  Alexander Alvarado  [id number] DOB: 06-Jun-1941 DOA: 01/18/2020 PCP: Glean Hess, MD   Brief Narrative: Alexander Kissner Hunteris a 79 y.o.malewith medical history significant ofhypertension, peripheral vascular diseases/pamputation of second toe and CVA  Brought to ED with concern of left-sided weakness which started this morning.Per patient he has some residual weakness on left side but when he got up this morning he was unable to move his left side.He normally walks with the help of walker since his prior stroke,he could not grasp a cup or walk .Hissymptoms started gradually improving as the day passes. He denies any facial droop, denies any change in his vision. He has left-sided drooping eyelid for a long time.  He was also complaining of worsening pain in his left foot. He has chronic wounds in his left third toe and heel. Apparently seen by in wound care nurse at home and getting treatment with no relief. He denies using any antibiotics or seen a physician for that. He has his second toe amputated due to osteo in the past. He denies any fever or chills. No upper respiratory symptoms. He lives alone and denies any sick contacts. He was also complaining of some increased urinary urgency and frequency. Denies any dysuria, hematuria or lower abdominal pain.  Assessment & Plan: Active Problems:   PAD (peripheral artery disease) (HCC)   Osteomyelitis of left foot (HCC)   Malnutrition of moderate degree  Left-sided weakness, possible stroke, history of stroke with left-sided deficits: Persistent deficits consistent with recurrent stroke. Though CT head initially without abnormality. LDL 42. - Pt declines MRI/MRA as well as repeat CT head due to inability to lay flat due to sinus issues. - Aspirin, statin, plavix and risk factor modification to continue. - Continue PT, OT  Left 3rd toe osteomyelitis and left heel ulceration complicated by PAD:  - s/p  revascularization with stents in LLE on 4/6, then left 3rd toe amputation, left heel ulcer debridement 4/7 by Dr. Luana Shu. - Continue current antimicrobials as recommended by ID, will tailor to operative culture results, ok to use po per ID previously. Will plan on 1 week of po Tx.  - Continue gabapentin for neuropathic component, prn tramadol, morphine. Continue tylenol, toradol. - Continue ASA, added plavix, continue statin.   Prediabetes:  - CBGs at inpatient goal without insulin. HbA1c consistent with prediabetes at 5.8%.  Tobacco use: Long term.  - Cessation counseling provided, drawing line between his use and current vascular issues.   Acute hypoxic respiratory failure: Unclear etiology though patient has long history of tobacco use and has required supplemental oxygen in past admissions, had aspiration pneumonia. This has resolved.  - Consider sleep study as outpatient.  HTN:  - Restarted home amlodipine, hydralazine  Urinary urgency, frequency:  - Negative UA.  - Bladder scan prn.  Insomnia:  - Continue trazodone 100mg , melatonin 5mg   RN Pressure Injury Documentation: Pressure Injury 01/20/20 Toe (Comment  which one) Anterior;Left Black dried wound (Active)  01/20/20 0830  Location: Toe (Comment  which one)  Location Orientation: Anterior;Left  Staging:   Wound Description (Comments): Black dried wound  Present on Admission:      Pressure Injury 01/20/20 Heel Left (Active)  01/20/20 0830  Location: Heel  Location Orientation: Left  Staging:   Wound Description (Comments):   Present on Admission:    Moderate protein calorie malnutrition. Supplement protein as able to optimize wound healing.   DVT prophylaxis: Lovenox (restart 4/8) Code Status: DNR Family Communication: None  at bedside.  Disposition Plan: Patient from home, anticipate DC to SNF in next 24 hours. Insurance authorization and covid testing pending.   Consultants:   Podiatry, Dr. Luana Shu  Vascular  surgery  Infectious diseases  Procedures:  01/26/20 Lower Extremity Angiography Schnier, Dolores Lory, MD  01/26/20 LOWER EXTREMITY INTERVENTION Schnier, Dolores Lory, MD  01/27/20 AMPUTATION RAY Partial 3rd toe amputation, Left heel debridement Caroline More, DPM   Antimicrobials:  Vancomycin, flagyl 3/29 - 4/8  Ceftriaxone 3/29 - 4/1  Subjective: Slept better, eating ok, has been moving his bowels ok, o fever. Main issue is continued pain in the heel which is improved with medications prn.  Objective: Vitals:   01/28/20 2117 01/28/20 2129 01/29/20 0054 01/29/20 0801  BP: 128/74 128/74 (!) 112/56 135/64  Pulse:  71 (!) 52 73  Resp:   18 16  Temp:   97.9 F (36.6 C) 97.7 F (36.5 C)  TempSrc:   Oral Oral  SpO2:   94% 95%  Weight:      Height:        Intake/Output Summary (Last 24 hours) at 01/29/2020 1257 Last data filed at 01/29/2020 1034 Gross per 24 hour  Intake 510.76 ml  Output 1800 ml  Net -1289.24 ml   Filed Weights   01/18/20 1244 01/19/20 1610  Weight: 68 kg 73.8 kg   Gen: 79 y.o. male in no distress Pulm: Nonlabored breathing room air. Clear. CV: Regular rate and rhythm. No murmur, rub, or gallop. No JVD, no dependent edema. GI: Abdomen soft, non-tender, non-distended, with normoactive bowel sounds.  Ext: Warm, no deformities Skin: No rashes, lesions or ulcers on visualized skin. Left foot dressing not removed today. Neuro: Alert and oriented. Left arm/hand weakness, slight ptosis on left, unchanged, no new focal neurological deficits. Psych: Judgement and insight appear fair. Mood euthymic & affect congruent. Behavior is appropriate.    Data Reviewed: I have personally reviewed following labs and imaging studies  CBC: Recent Labs  Lab 01/27/20 0500 01/28/20 0453 01/29/20 0553  WBC 15.0* 16.6* 12.2*  HGB 15.7 15.0 14.6  HCT 47.3 46.1 45.4  MCV 92.6 94.5 94.8  PLT 318 313 [id number]   Basic Metabolic Panel: Recent Labs  Lab 01/23/20 0419 01/24/20 0022  01/25/20 0407 01/26/20 0502 01/27/20 0500 01/28/20 0453 01/29/20 0553  NA 137  --   --  137 137 136 139  K 3.7  --   --  4.0 4.1 4.4 4.2  CL 102  --   --  103 102 104 105  CO2 27  --   --  27 26 25 27   GLUCOSE 91  --   --  96 148* 139* 84  BUN 11  --   --  10 13 17 20   CREATININE 0.62   < > 0.61 0.54* 0.52* 0.58* 0.62  CALCIUM 8.5*  --   --  8.6* 8.5* 8.4* 8.6*  MG  --   --   --   --  2.0  --   --    < > = values in this interval not displayed.   Liver Function Tests: No results for input(s): AST, ALT, ALKPHOS, BILITOT, PROT, ALBUMIN in the last 168 hours. CBG: No results for input(s): GLUCAP in the last 168 hours. Urine analysis:    Component Value Date/Time   COLORURINE YELLOW (A) 01/19/2020 1516   APPEARANCEUR CLEAR (A) 01/19/2020 1516   APPEARANCEUR Hazy 07/14/2012 2232   LABSPEC 1.010 01/19/2020 1516   LABSPEC 1.024  07/14/2012 2232   PHURINE 7.0 01/19/2020 [place] 01/19/2020 1516   GLUCOSEU Negative 07/14/2012 2232   HGBUR NEGATIVE 01/19/2020 1516   BILIRUBINUR NEGATIVE 01/19/2020 1516   BILIRUBINUR neg 02/10/2019 0847   BILIRUBINUR Negative 07/14/2012 [place] 01/19/2020 1516   PROTEINUR NEGATIVE 01/19/2020 1516   UROBILINOGEN 0.2 02/10/2019 0847   NITRITE NEGATIVE 01/19/2020 1516   LEUKOCYTESUR SMALL (A) 01/19/2020 1516   LEUKOCYTESUR Negative 07/14/2012 2232   Recent Results (from the past 240 hour(s))  MRSA PCR Screening     Status: Abnormal   Collection Time: 01/25/20 10:00 PM   Specimen: Nasopharyngeal  Result Value Ref Range Status   MRSA by PCR POSITIVE (A) NEGATIVE Final    Comment:        The GeneXpert MRSA Assay (FDA approved for NASAL specimens only), is one component of a comprehensive MRSA colonization surveillance program. It is not intended to diagnose MRSA infection nor to guide or monitor treatment for MRSA infections. RESULT CALLED TO, READ BACK BY AND VERIFIED WITH: Azzie Almas RN [phone] 01/25/20  HNM Performed at [facility], [address] [place], [place] [address]   Aerobic/Anaerobic Culture (surgical/deep wound)     Status: None (Preliminary result)   Collection Time: 01/27/20 12:52 PM   Specimen: PATH Digit amputation; Tissue  Result Value Ref Range Status   Specimen Description   Final    TOE GANGRENE LEFT 3RD TOE Performed at [facility], [address] [place], [place] [address]    Special Requests   Final    NONE Performed at [facility], [place], [place] [address]    Gram Stain   Final    ABUNDANT WBC PRESENT,BOTH PMN AND MONONUCLEAR FEW GRAM POSITIVE COCCI FEW GRAM POSITIVE RODS    Culture   Final    FEW STAPHYLOCOCCUS AUREUS FEW ENTEROCOCCUS FAECALIS SUSCEPTIBILITIES TO FOLLOW Performed at [facility], [place] [address], [place], [place] [address]    Report Status PENDING  Incomplete      Radiology Studies: DG Foot Complete Left  Result Date: 01/28/2020 CLINICAL DATA:  79 year old male postop day 1 partial 3rd toe amputation, left heel debridement. EXAM: LEFT FOOT - COMPLETE 3+ VIEW COMPARISON:  Left foot series 01/18/2020. FINDINGS: Left 3rd phalanges are now surgically absent. Possible associated osteotomy of the head of the 3rd metatarsal, although the surgical margin appears indistinct as seen on image 2. No regional soft tissue gas. Pre-existing partial left 2nd toe amputation. There has been loss of cortical definition also at the lateral 2nd metatarsal head. Other metatarsals and phalanges appear stable. Tarsal bones appear intact. Calcaneus intact. IMPRESSION: 1. Left 3rd phalanges now surgically absent. 2. Difficult to exclude Active Osteomyelitis in the heads of the 2nd and 3rd metatarsals, although the appearance might reflect surgical debridement. Correlation with operative findings requested. If clinically equivocal, short interval follow-up radiographs would be valuable. Electronically Signed   By:  Genevie Ann M.D.   On: 01/28/2020 17:05    Scheduled Meds: . amLODipine  10 mg Oral Daily  . aspirin EC  81 mg Oral Daily  . atorvastatin  20 mg Oral QHS  . Chlorhexidine Gluconate Cloth  6 each Topical Q0600  . clopidogrel  75 mg Oral Daily  . enoxaparin (LOVENOX) injection  40 mg Subcutaneous Q24H  . feeding supplement (ENSURE ENLIVE)  237 mL Oral TID BM  . folic acid  1 mg Oral Daily  .  gabapentin  100 mg Oral TID  . hydrALAZINE  25 mg Oral BID  . melatonin  5 mg Oral QHS  . metroNIDAZOLE  500 mg Oral Q8H  . multivitamin-lutein  1 capsule Oral Daily  . mupirocin ointment  1 application Nasal BID  . pantoprazole  40 mg Oral Daily  . traZODone  100 mg Oral QHS  . vitamin B-12  1,000 mcg Oral Daily   Continuous Infusions: . sodium chloride 1,000 mL (01/26/20 1151)  . sodium chloride    . sodium chloride 50 mL/hr at 01/29/20 1220  . vancomycin 750 mg (01/29/20 0028)     LOS: 11 days   Time spent: 25 minutes.  Patrecia Pour, MD [facility] [email or web address] 01/29/2020, 12:57 PM

## 2020-01-29 NOTE — TOC Progression Note (Signed)
Transition of Care [facility]) - Progression Note    Patient Details  Name: Alexander Alvarado MRN: [id number] Date of Birth: 28-Mar-1941  Transition of Care [facility]) CM/SW Contact  Su Hilt, RN Phone Number: 01/29/2020, 11:36 AM  Clinical Narrative:    Theodoro Kos and left a VM for Alexander Alvarado the administrator  Inquiring about the insurance [place] still pending for [employer] Expected Discharge Plan: [place]    Expected Discharge Plan and Services Expected Discharge Plan: [place]   Discharge Planning Services: CM Consult Post Acute Care Choice: [place] Living arrangements for the past 2 months: Single Family Home                                       Social Determinants of Health (SDOH) Interventions    Readmission Risk Interventions No flowsheet data found.

## 2020-01-29 NOTE — Progress Notes (Signed)
Ch visited with Pt as part of regular rounding. Pt was watching TV. Ch introduced self, and told Pt that she was there to check in on him. Pt looked surprised by visit. Pt told chaplain that he does not understand why they are talking about discharging him when he does not feel any better than when he came in. Ch let Pt know that she will check with RN when she leaves, to check about why. Pt reported that one of his toes have been amputated, but the ulcer in his heel is the real issue. Pt told Ch that he does not have anyone to take care of him. Both of his military-retired sons live out of town in [place], and in [place]. Pt said "I am scared." "God has forgotten me!" Pt requested prayer. Ch prayed with Pt for peace and strength.  During visit, Pt's RN came to give Pt medications. Ch asked RN about Pt's concerns about discharge. RN reported that Pt was going to be sent to a skilled nursing facility. Pt said "It would be better if they take me in the back and shoot me on the back of my head than send me there" Pt is anxious, confused and concerned. Ch will let on-call chaplain know about Pt, for continued support during stay. Pt was grateful for prayer and visit.

## 2020-01-29 NOTE — Progress Notes (Addendum)
PODIATRY / FOOT AND ANKLE SURGERY PROGRESS NOTE  Chief Complaint: L heel pain, foot pain, wounds   HPI: Alexander Alvarado is a 79 y.o. male who presents s/p 2d L partial 3rd ray amputation and heel subQ wound debridement.  Pt still having pain to the amputation sites but it seems relatively well controlled and chronic in nature overall.  Pt denies any other complaints at this time.  PMHx:  Past Medical History:  Diagnosis Date  . Depression   . GERD (gastroesophageal reflux disease)   . High cholesterol   . Hypertension   . PVD (peripheral vascular disease) ([place])   . Stroke [facility])    left side weakness    Surgical Hx:  Past Surgical History:  Procedure Laterality Date  . AMPUTATION Left 08/31/2015   Procedure: AMPUTATION DIGIT left 2nd toe ;  Surgeon: Samara Deist, DPM;  Location: [place];  Service: Podiatry;  Laterality: Left;  . AMPUTATION Left 01/27/2020   Procedure: AMPUTATION RAY Partial 3rd toe amputation, Left heel debridement;  Surgeon: Caroline More, DPM;  Location: [facility];  Service: Podiatry;  Laterality: Left;  . AMPUTATION TOE Left 08/2015   second toe  . CHOLECYSTECTOMY    . HIP ARTHROPLASTY Right 09/11/2019   Procedure: ARTHROPLASTY BIPOLAR HIP (HEMIARTHROPLASTY);  Surgeon: Leim Fabry, MD;  Location: [facility];  Service: Orthopedics;  Laterality: Right;  . LOWER EXTREMITY INTERVENTION Left 01/26/2020   Procedure: LOWER EXTREMITY INTERVENTION;  Surgeon: Katha Cabal, MD;  Location: [facility];  Service: Cardiovascular;  Laterality: Left;  . LUMBAR DISC SURGERY    . VASCULAR SURGERY Left 2011   x 5 to left leg    FHx:  Family History  Problem Relation Age of Onset  . CAD Mother     Social History:  reports that he has been smoking cigarettes. He has been smoking about 0.50 packs per day. He has never used smokeless tobacco. He reports that he does not drink alcohol or use drugs.  Allergies: No Known Allergies  Review of  Systems: History obtained from chart review and the patient General ROS: positive for  - fatigue Respiratory ROS: no cough, shortness of breath, or wheezing Cardiovascular ROS: no chest pain or dyspnea on exertion Musculoskeletal ROS: positive for - joint pain, muscle pain and muscular weakness Neurological ROS: positive for - numbness/tingling Dermatological ROS: positive for L heel wound, R 3rd ray partial amp incision site  Medications Prior to Admission  Medication Sig Dispense Refill  . acetaminophen (TYLENOL) 325 MG tablet Take 2 tablets (650 mg total) by mouth every 6 (six) hours as needed for mild pain. 100 tablet 2  . amLODipine (NORVASC) 10 MG tablet Take 1 tablet (10 mg total) by mouth daily. 90 tablet 1  . aspirin 81 MG tablet Take 81 mg by mouth daily.     Marland Kitchen atorvastatin (LIPITOR) 20 MG tablet TAKE 1 TABLET BY MOUTH AT BEDTIME 90 tablet 1  . cyanocobalamin 1000 MCG tablet Take 1,000 mcg by mouth daily.     . folic acid (FOLVITE) 1 MG tablet Take 1 mg by mouth daily.     . hydrALAZINE (APRESOLINE) 25 MG tablet Take 1 tablet (25 mg total) by mouth 2 (two) times daily. 180 tablet 3  . metoprolol tartrate (LOPRESSOR) 25 MG tablet Take 1 tablet (25 mg total) by mouth 2 (two) times daily. 180 tablet 3  . omeprazole (PRILOSEC) 40 MG capsule Take 1 capsule (40 mg total) by mouth daily.  90 capsule 3  . quinapril (ACCUPRIL) 40 MG tablet Take 1 tablet (40 mg total) by mouth daily. 90 tablet 1  . oxyCODONE (OXY IR/ROXICODONE) 5 MG immediate release tablet Take 1 tablet (5 mg total) by mouth every 3 (three) hours as needed for moderate pain (pain score 4-6). (Patient not taking: Reported on 01/18/2020) 30 tablet 0    Physical Exam: General: Alert and oriented.  No apparent distress.  Vascular: DP/PT pulses nonpalpable bil, CFT appears intact to L 3rd ray amputation site, no hair growth to BLE.  Skin is thin and strophic to BLE.  Neuro: Reduced light touch sensation BLE.  Derm:  L 3rd  ray amputation site appears stable with mild edema and erythema consistent with postop course, mild serousanginous drainage, no odor.  Sutures intact and skin edges well coapted.   L posterior heel wound measures 0.4 x 0.3 x 0.2 and is granular in nature, no erythema, mild edea, no odor, no drainage.    MSK: L 2nd toe paritla and partial 3rd ray amputations   Results for orders placed or performed during the hospital encounter of 01/18/20 (from the past 48 hour(s))  Aerobic/Anaerobic Culture (surgical/deep wound)     Status: None (Preliminary result)   Collection Time: 01/27/20 12:52 PM   Specimen: PATH Digit amputation; Tissue  Result Value Ref Range   Specimen Description      TOE GANGRENE LEFT 3RD TOE Performed at [facility], [address] [place], [place] [address]    Special Requests      NONE Performed at [facility], [place], [place] [address]    Gram Stain      ABUNDANT WBC PRESENT,BOTH PMN AND MONONUCLEAR FEW GRAM POSITIVE COCCI FEW GRAM POSITIVE RODS    Culture      FEW STAPHYLOCOCCUS AUREUS FEW ENTEROCOCCUS FAECALIS SUSCEPTIBILITIES TO FOLLOW Performed at [facility], [place] [address], [place], [place] [address]    Report Status PENDING   CBC     Status: Abnormal   Collection Time: 01/28/20  4:53 AM  Result Value Ref Range   WBC 16.6 (H) 4.0 - 10.5 K/uL   RBC 4.88 4.22 - 5.81 MIL/uL   Hemoglobin 15.0 13.0 - 17.0 g/dL   HCT 46.1 39.0 - 52.0 %   MCV 94.5 80.0 - 100.0 fL   MCH 30.7 26.0 - 34.0 pg   MCHC 32.5 30.0 - 36.0 g/dL   RDW 13.8 11.5 - 15.5 %   Platelets 313 150 - 400 K/uL   nRBC 0.0 0.0 - 0.2 %    Comment: Performed at [facility], [address], [place], [place] [id number]  Basic metabolic panel     Status: Abnormal   Collection Time: 01/28/20  4:53 AM  Result Value Ref Range   Sodium 136 135 - 145 mmol/L   Potassium 4.4 3.5 - 5.1 mmol/L   Chloride 104 98 - 111 mmol/L   CO2 25 22 - 32  mmol/L   Glucose, Bld 139 (H) 70 - 99 mg/dL    Comment: Glucose reference range applies only to samples taken after fasting for at least 8 hours.   BUN 17 8 - 23 mg/dL   Creatinine, Ser 0.58 (L) 0.61 - 1.24 mg/dL   Calcium 8.4 (L) 8.9 - 10.3 mg/dL   GFR calc non Af Amer >60 >60 mL/min   GFR calc Af Amer >60 >60 mL/min   Anion gap 7 5 - 15  Comment: Performed at [facility], [address] [place], [place] [address]  CBC     Status: Abnormal   Collection Time: 01/29/20  5:53 AM  Result Value Ref Range   WBC 12.2 (H) 4.0 - 10.5 K/uL   RBC 4.79 4.22 - 5.81 MIL/uL   Hemoglobin 14.6 13.0 - 17.0 g/dL   HCT 45.4 39.0 - 52.0 %   MCV 94.8 80.0 - 100.0 fL   MCH 30.5 26.0 - 34.0 pg   MCHC 32.2 30.0 - 36.0 g/dL   RDW 13.9 11.5 - 15.5 %   Platelets 307 150 - 400 K/uL   nRBC 0.0 0.0 - 0.2 %    Comment: Performed at [facility], [address] [place], [place] [id number]  Basic metabolic panel     Status: Abnormal   Collection Time: 01/29/20  5:53 AM  Result Value Ref Range   Sodium 139 135 - 145 mmol/L   Potassium 4.2 3.5 - 5.1 mmol/L   Chloride 105 98 - 111 mmol/L   CO2 27 22 - 32 mmol/L   Glucose, Bld 84 70 - 99 mg/dL    Comment: Glucose reference range applies only to samples taken after fasting for at least 8 hours.   BUN 20 8 - 23 mg/dL   Creatinine, Ser 0.62 0.61 - 1.24 mg/dL   Calcium 8.6 (L) 8.9 - 10.3 mg/dL   GFR calc non Af Amer >60 >60 mL/min   GFR calc Af Amer >60 >60 mL/min   Anion gap 7 5 - 15    Comment: Performed at [facility], [address] [place],  [address]   DG Foot Complete Left  Result Date: 01/28/2020 CLINICAL DATA:  79 year old male postop day 1 partial 3rd toe amputation, left heel debridement. EXAM: LEFT FOOT - COMPLETE 3+ VIEW COMPARISON:  Left foot series 01/18/2020. FINDINGS: Left 3rd phalanges are now surgically absent. Possible associated osteotomy of the head of the 3rd metatarsal, although the surgical margin  appears indistinct as seen on image 2. No regional soft tissue gas. Pre-existing partial left 2nd toe amputation. There has been loss of cortical definition also at the lateral 2nd metatarsal head. Other metatarsals and phalanges appear stable. Tarsal bones appear intact. Calcaneus intact. IMPRESSION: 1. Left 3rd phalanges now surgically absent. 2. Difficult to exclude Active Osteomyelitis in the heads of the 2nd and 3rd metatarsals, although the appearance might reflect surgical debridement. Correlation with operative findings requested. If clinically equivocal, short interval follow-up radiographs would be valuable. Electronically Signed   By: Genevie Ann M.D.   On: 01/28/2020 17:05    Blood pressure 135/64, pulse 73, temperature 97.7 F (36.5 C), temperature source Oral, resp. rate 16, height 5\' 10"  (1.778 m), weight 73.8 kg, SpO2 95 %.  Assessment 1. S/p R partial 3rd ray amputation due to dry gangrene and osteomyelitis 2. PVD 3. L heel ulceration 4. DM2 with polyneuropathy  Plan -Patient seen and examined. -Incision site to the L 3rd ray amputation appears to be well coapted with sutures intact, no SOI noted. -L heel ulceration appears to be stable and getting smaller in size with each visit.  No SOI present. -Postop X-rays reviewed - s/p L 3rd ray partial amputation, some mild changes to the 2nd MTPJ but are present on previous x-ray prior to procedure as well.  No open wound in that area and area was inspected and debrided during surgical case. -Redressed with betadine gauze, 4x4, abd, kerlix, ace wrap.  Pt to keep dressing CDI until his postop appointment in 1 week.  Instructions placed in chart. -Maintain minimal WB to the LLE due to the heel wound and amputation site incision.  Surgical shoes for both feet ordered. -Appreciate recs for Abx.  Likely can be discharge on PO bactrim for 7d course.  Cultures intraop: enterococcus and staph.  Sensitivities pending.  Appreciate ID recs.  Proximal  3rd ray amp site margin clear of infection per path report. -Will continue to monitor 2nd ray with serial x-rays as the incision site heals.  Patient blood flow limits potential success with current and subsequent amputations/debridements.  Discussed with patient that with the flow that he has he still may not be able to heal the amputation site. -Appreciate PT/OT recs.  Plan for skilled nursing facility.  Podiatry team to sign off on patient with discharge instructions placed in chart.  Caroline More 01/29/2020, 12:45 PM

## 2020-01-29 NOTE — Discharge Instructions (Signed)
PODIATRY DISCHARGE INSTRUCTIONS: 1. Keep surgical dressings clean, dry, and intact.  If saturated or removed apply betadine to wound on L heel and incision site L 3rd toe area, 4x4 gauze, ABD to heel, kerlix, ace wrap with minimal compression. 2. Try to stay off of the left foot as much as possible. 3. Take antibiotics as prescribed until gone.

## 2020-01-30 MED ORDER — IBUPROFEN 400 MG PO TABS
400.0000 mg | ORAL_TABLET | Freq: Three times a day (TID) | ORAL | Status: DC
  Administered 2020-01-30 – 2020-02-01 (×8): 400 mg via ORAL
  Filled 2020-01-30 (×8): qty 1

## 2020-01-30 MED ORDER — GABAPENTIN 300 MG PO CAPS
300.0000 mg | ORAL_CAPSULE | Freq: Two times a day (BID) | ORAL | Status: DC
  Administered 2020-01-30 – 2020-02-01 (×5): 300 mg via ORAL
  Filled 2020-01-30 (×5): qty 1

## 2020-01-30 MED ORDER — ACETAMINOPHEN 500 MG PO TABS
500.0000 mg | ORAL_TABLET | Freq: Three times a day (TID) | ORAL | Status: DC
  Administered 2020-01-30 – 2020-02-01 (×7): 500 mg via ORAL
  Filled 2020-01-30 (×7): qty 1

## 2020-01-30 MED ORDER — MORPHINE SULFATE (PF) 2 MG/ML IV SOLN
2.0000 mg | INTRAVENOUS | Status: DC | PRN
  Administered 2020-01-30: 2 mg via INTRAVENOUS
  Filled 2020-01-30: qty 1

## 2020-01-30 MED ORDER — ACETAMINOPHEN 325 MG PO TABS: 650.0000 mg | ORAL_TABLET | Freq: Four times a day (QID) | ORAL | Status: DC | PRN

## 2020-01-30 MED ORDER — OXYCODONE HCL 5 MG PO TABS
5.0000 mg | ORAL_TABLET | ORAL | Status: DC | PRN
  Administered 2020-01-30 – 2020-02-01 (×3): 5 mg via ORAL
  Filled 2020-01-30 (×3): qty 1

## 2020-01-30 NOTE — Progress Notes (Signed)
PT Cancellation Note  Patient Details Name: Alexander Alvarado MRN: [id number] DOB: Apr 07, 1941   Cancelled Treatment:     PT attempt, Pt refused stating 9/10 pain. Per RN, pt has pain meds already. Pt unwilling to participate even with max encouragement. Will return next date when pt more appropriate.    [place] Pa 01/30/2020, 4:02 PM

## 2020-01-30 NOTE — Progress Notes (Signed)
PROGRESS NOTE    COPELAND RYGG  [id number] DOB: February 19, 1941 DOA: 01/18/2020 PCP: Glean Hess, MD   Brief Narrative:  Alexander Schuth Hunteris a 79 y.o.malewith medical history significant ofhypertension, peripheral vascular diseases/pamputation of second toe and CVA  Brought to ED with concern of left-sided weakness which started this morning.Per patient he has some residual weakness on left side but when he got up this morning he was unable to move his left side.He normally walks with the help of walker since his prior stroke,he could not grasp a cup or walk .Hissymptoms started gradually improving as the day passes. He denies any facial droop, denies any change in his vision. He has left-sided drooping eyelid for a long time.  He was also complaining of worsening pain in his left foot. He has chronic wounds in his left third toe and heel. Apparently seen by in wound care nurse at home and getting treatment with no relief. He denies using any antibiotics or seen a physician for that. He has his second toe amputated due to osteo in the past. He denies any fever or chills. No upper respiratory symptoms. He lives alone and denies any sick contacts. He was also complaining of some increased urinary urgency and frequency. Denies any dysuria, hematuria or lower abdominal pain.  4/10: Patient seen and examined.  Still endorsing a significant amount of pain.  Pain localized to the left lower extremity distal to the level of the knee.  Endorsing 10 out of 10 pain.   Assessment & Plan:   Active Problems:   PAD (peripheral artery disease) (HCC)   Osteomyelitis of left foot (HCC)   Malnutrition of moderate degree   Left-sided weakness, possible stroke, history of stroke with left-sided deficits: Persistent deficits consistent with recurrent stroke. Though CT head initially without abnormality. LDL 42. - Pt declines MRI/MRA as well as repeat CT head due to inability to lay  flat due to sinus issues. - Aspirin, statin, plavix and risk factor modification to continue. - Continue PT, OT: rec SNF  Left 3rd toe osteomyelitis and left heel ulceration complicated by PAD Intractable pain - s/p revascularization with stents in LLE on 4/6, then left 3rd toe amputation, left heel ulcer debridement 4/7 by Dr. Luana Shu. -Patient continues to endorse severe pain left lower extremity that has been unaffected by current regimen  Plan: -Continue Bactrim per sensitivity results, 1 week of p.o. treatment sufficient as source control achieved -Multimodal pain regimen.  Increase gabapentin to 300mg  twice daily.  Continue Tylenol 650 mg every 6 hours as needed.  Scheduled ibuprofen 400 mg 3 times daily.  As needed oxycodone 5 mg every 4 hours as needed.  May need to use IV narcotic for breakthrough pain however will not order at this time.  Patient received IV morphine this morning and experienced respiratory depression. - Continue ASA, added plavix, continue statin.  -Once pain control improved patient can discharge to skilled nursing facility.  Anticipate medical readiness within 24-48 hours.  Prediabetes:  - CBGs at inpatient goal without insulin. HbA1c consistent with prediabetes at 5.8%.  Tobacco use: Long term.  - Cessation counseling provided  Acute hypoxic respiratory failure: Unclear etiology though patient has long history of tobacco use and has required supplemental oxygen in past admissions, had aspiration pneumonia.  - At this time likely driven by narcotic-induced respiratory depression - Consider sleep study as outpatient.  HTN:  - Restarted home amlodipine, hydralazine  Urinary urgency, frequency:  - Negative UA.  -  Bladder scan prn.  Insomnia:  - Continue trazodone 100mg , melatonin 5mg    DVT prophylaxis: LMWH Code Status: DNR Family Communication: none today Disposition Plan: Anticipate SNF.  Currently patient has acute pain requiring IV narcotic  administration.  Will attempt to optimize pain regimen.  Anticipate medical readiness for discharge in 24 to 48 hours  Consultants:   Podiatry  Vascular  ID  Procedures:  01/26/20 Lower Extremity Angiography Schnier, Dolores Lory, MD  01/26/20 LOWER EXTREMITY INTERVENTION Schnier, Dolores Lory, MD  01/27/20 AMPUTATION RAY Partial 3rd toe amputation, Left heel debridement Caroline More, DPM     Antimicrobials:   Bactrim   Subjective: Seen and examined Continues to endorse severe LLE pain  Objective: Vitals:   01/30/20 1023 01/30/20 1027 01/30/20 1136 01/30/20 1153  BP:    109/70  Pulse:    62  Resp:    16  Temp:      TempSrc:      SpO2: (!) 89% 94% (!) 88% 92%  Weight:      Height:        Intake/Output Summary (Last 24 hours) at 01/30/2020 1235 Last data filed at 01/30/2020 1043 Gross per 24 hour  Intake 2113.04 ml  Output 1250 ml  Net 863.04 ml   Filed Weights   01/18/20 1244 01/19/20 1610  Weight: 68 kg 73.8 kg    Examination:  Gen: 79 y.o. male in no distress Pulm: Nonlabored breathing room air. Clear. CV: Regular rate and rhythm. No murmur, rub, or gallop. No JVD, no dependent edema. GI: Abdomen soft, non-tender, non-distended, with normoactive bowel sounds.  Ext: Warm, no deformities Skin: No rashes, lesions or ulcers on visualized skin. Left foot dressing not removed today. Neuro: Alert and oriented. Left arm/hand weakness, slight ptosis on left, unchanged, no new focal neurological deficits. Psych: Judgement and insight appear fair. Mood euthymic & affect congruent. Behavior is appropriate.      Data Reviewed: I have personally reviewed following labs and imaging studies  CBC: Recent Labs  Lab 01/27/20 0500 01/28/20 0453 01/29/20 0553  WBC 15.0* 16.6* 12.2*  HGB 15.7 15.0 14.6  HCT 47.3 46.1 45.4  MCV 92.6 94.5 94.8  PLT 318 313 [id number]   Basic Metabolic Panel: Recent Labs  Lab 01/25/20 0407 01/26/20 0502 01/27/20 0500 01/28/20 0453  01/29/20 0553  NA  --  137 137 136 139  K  --  4.0 4.1 4.4 4.2  CL  --  103 102 104 105  CO2  --  27 26 25 27   GLUCOSE  --  96 148* 139* 84  BUN  --  10 13 17 20   CREATININE 0.61 0.54* 0.52* 0.58* 0.62  CALCIUM  --  8.6* 8.5* 8.4* 8.6*  MG  --   --  2.0  --   --    GFR: Estimated Creatinine Clearance: 81.1 mL/min (by C-G formula based on SCr of 0.62 mg/dL). Liver Function Tests: No results for input(s): AST, ALT, ALKPHOS, BILITOT, PROT, ALBUMIN in the last 168 hours. No results for input(s): LIPASE, AMYLASE in the last 168 hours. No results for input(s): AMMONIA in the last 168 hours. Coagulation Profile: No results for input(s): INR, PROTIME in the last 168 hours. Cardiac Enzymes: No results for input(s): CKTOTAL, CKMB, CKMBINDEX, TROPONINI in the last 168 hours. BNP (last 3 results) No results for input(s): PROBNP in the last 8760 hours. HbA1C: No results for input(s): HGBA1C in the last 72 hours. CBG: No results for input(s): GLUCAP in the  last 168 hours. Lipid Profile: No results for input(s): CHOL, HDL, LDLCALC, TRIG, CHOLHDL, LDLDIRECT in the last 72 hours. Thyroid Function Tests: No results for input(s): TSH, T4TOTAL, FREET4, T3FREE, THYROIDAB in the last 72 hours. Anemia Panel: No results for input(s): VITAMINB12, FOLATE, FERRITIN, TIBC, IRON, RETICCTPCT in the last 72 hours. Sepsis Labs: No results for input(s): PROCALCITON, LATICACIDVEN in the last 168 hours.  Recent Results (from the past 240 hour(s))  MRSA PCR Screening     Status: Abnormal   Collection Time: 01/25/20 10:00 PM   Specimen: Nasopharyngeal  Result Value Ref Range Status   MRSA by PCR POSITIVE (A) NEGATIVE Final    Comment:        The GeneXpert MRSA Assay (FDA approved for NASAL specimens only), is one component of a comprehensive MRSA colonization surveillance program. It is not intended to diagnose MRSA infection nor to guide or monitor treatment for MRSA infections. RESULT CALLED TO,  READ BACK BY AND VERIFIED WITH: Azzie Almas RN [phone] 01/25/20 HNM Performed at [facility], [address] [place], [place] [address]   Aerobic/Anaerobic Culture (surgical/deep wound)     Status: None (Preliminary result)   Collection Time: 01/27/20 12:52 PM   Specimen: PATH Digit amputation; Tissue  Result Value Ref Range Status   Specimen Description   Final    TOE GANGRENE LEFT 3RD TOE Performed at [facility], [address] [place], [place] [address]    Special Requests   Final    NONE Performed at [facility], [place], [place] [address]    Gram Stain   Final    ABUNDANT WBC PRESENT,BOTH PMN AND MONONUCLEAR FEW GRAM POSITIVE COCCI FEW GRAM POSITIVE RODS Performed at [facility], [place] [address], [place], [place] [address]    Culture   Final    FEW METHICILLIN RESISTANT STAPHYLOCOCCUS AUREUS FEW ENTEROCOCCUS FAECALIS SUSCEPTIBILITIES TO FOLLOW NO ANAEROBES ISOLATED; CULTURE IN PROGRESS FOR 5 DAYS    Report Status PENDING  Incomplete   Organism ID, Bacteria METHICILLIN RESISTANT STAPHYLOCOCCUS AUREUS  Final      Susceptibility   Methicillin resistant staphylococcus aureus - MIC*    CIPROFLOXACIN >=8 RESISTANT Resistant     ERYTHROMYCIN >=8 RESISTANT Resistant     GENTAMICIN <=0.5 SENSITIVE Sensitive     OXACILLIN >=4 RESISTANT Resistant     TETRACYCLINE <=1 SENSITIVE Sensitive     VANCOMYCIN <=0.5 SENSITIVE Sensitive     TRIMETH/SULFA <=10 SENSITIVE Sensitive     CLINDAMYCIN <=0.25 SENSITIVE Sensitive     RIFAMPIN <=0.5 SENSITIVE Sensitive     Inducible Clindamycin NEGATIVE Sensitive     * FEW METHICILLIN RESISTANT STAPHYLOCOCCUS AUREUS  SARS CORONAVIRUS 2 (TAT 6-24 HRS) Nasopharyngeal Nasopharyngeal Swab     Status: None   Collection Time: 01/27/20  1:54 PM   Specimen: Nasopharyngeal Swab  Result Value Ref Range Status   SARS Coronavirus 2 NEGATIVE NEGATIVE Final    Comment: (NOTE) SARS-CoV-2 target nucleic  acids are NOT DETECTED. The SARS-CoV-2 RNA is generally detectable in upper and lower respiratory specimens during the acute phase of infection. Negative results do not preclude SARS-CoV-2 infection, do not rule out co-infections with other pathogens, and should not be used as the sole basis for treatment or other patient management decisions. Negative results must be combined with clinical observations, patient history, and epidemiological information. The expected result is Negative. Fact Sheet for Patients: SugarRoll.be Fact Sheet for Healthcare Providers: https://www.woods-mathews.com/ This test is not  yet approved or cleared by the [place] and  has been authorized for detection and/or diagnosis of SARS-CoV-2 by FDA under an Emergency Use Authorization (EUA). This EUA will remain  in effect (meaning this test can be used) for the duration of the COVID-19 declaration under Section 56 4(b)(1) of the Act, 21 U.S.C. section 360bbb-3(b)(1), unless the authorization is terminated or revoked sooner. Performed at [facility], [place] [address], [place], [place] [address]          Radiology Studies: DG Foot Complete Left  Result Date: 01/28/2020 CLINICAL DATA:  79 year old male postop day 1 partial 3rd toe amputation, left heel debridement. EXAM: LEFT FOOT - COMPLETE 3+ VIEW COMPARISON:  Left foot series 01/18/2020. FINDINGS: Left 3rd phalanges are now surgically absent. Possible associated osteotomy of the head of the 3rd metatarsal, although the surgical margin appears indistinct as seen on image 2. No regional soft tissue gas. Pre-existing partial left 2nd toe amputation. There has been loss of cortical definition also at the lateral 2nd metatarsal head. Other metatarsals and phalanges appear stable. Tarsal bones appear intact. Calcaneus intact. IMPRESSION: 1. Left 3rd phalanges now surgically absent. 2. Difficult to exclude Active  Osteomyelitis in the heads of the 2nd and 3rd metatarsals, although the appearance might reflect surgical debridement. Correlation with operative findings requested. If clinically equivocal, short interval follow-up radiographs would be valuable. Electronically Signed   By: Genevie Ann M.D.   On: 01/28/2020 17:05        Scheduled Meds: . amLODipine  10 mg Oral Daily  . aspirin EC  81 mg Oral Daily  . atorvastatin  20 mg Oral QHS  . Chlorhexidine Gluconate Cloth  6 each Topical Q0600  . clopidogrel  75 mg Oral Daily  . enoxaparin (LOVENOX) injection  40 mg Subcutaneous Q24H  . feeding supplement (ENSURE ENLIVE)  237 mL Oral TID BM  . folic acid  1 mg Oral Daily  . gabapentin  300 mg Oral BID  . hydrALAZINE  25 mg Oral BID  . ibuprofen  400 mg Oral TID  . melatonin  5 mg Oral QHS  . multivitamin-lutein  1 capsule Oral Daily  . mupirocin ointment  1 application Nasal BID  . pantoprazole  40 mg Oral Daily  . sulfamethoxazole-trimethoprim  1 tablet Oral Q12H  . traZODone  100 mg Oral QHS  . vitamin B-12  1,000 mcg Oral Daily   Continuous Infusions: . sodium chloride 1,000 mL (01/26/20 1151)  . sodium chloride       LOS: 12 days    Time spent:35 min    Sidney Ace, MD [facility] Pager [phone]  If 7PM-7AM, please contact night-coverage 01/30/2020, 12:35 PM

## 2020-01-31 LAB — CBC WITH DIFFERENTIAL/PLATELET
Abs Immature Granulocytes: 0.03 10*3/uL (ref 0.00–0.07)
Basophils Absolute: 0.1 10*3/uL (ref 0.0–0.1)
Basophils Relative: 1 %
Eosinophils Absolute: 0 10*3/uL (ref 0.0–0.5)
Eosinophils Relative: 0 %
HCT: 50 % (ref 39.0–52.0)
Hemoglobin: 16 g/dL (ref 13.0–17.0)
Immature Granulocytes: 0 %
Lymphocytes Relative: 18 %
Lymphs Abs: 2 10*3/uL (ref 0.7–4.0)
MCH: 30.1 pg (ref 26.0–34.0)
MCHC: 32 g/dL (ref 30.0–36.0)
MCV: 94 fL (ref 80.0–100.0)
Monocytes Absolute: 1 10*3/uL (ref 0.1–1.0)
Monocytes Relative: 9 %
Neutro Abs: 7.6 10*3/uL (ref 1.7–7.7)
Neutrophils Relative %: 72 %
Platelets: 318 10*3/uL (ref 150–400)
RBC: 5.32 MIL/uL (ref 4.22–5.81)
RDW: 14.1 % (ref 11.5–15.5)
WBC: 10.7 10*3/uL — ABNORMAL HIGH (ref 4.0–10.5)
nRBC: 0 % (ref 0.0–0.2)

## 2020-01-31 LAB — BASIC METABOLIC PANEL
Anion gap: 8 (ref 5–15)
BUN: 22 mg/dL (ref 8–23)
CO2: 26 mmol/L (ref 22–32)
Calcium: 8.5 mg/dL — ABNORMAL LOW (ref 8.9–10.3)
Chloride: 102 mmol/L (ref 98–111)
Creatinine, Ser: 0.74 mg/dL (ref 0.61–1.24)
GFR calc Af Amer: >60 mL/min (ref >60)
GFR calc non Af Amer: >60 mL/min (ref >60)
Glucose, Bld: 113 mg/dL — ABNORMAL HIGH (ref 70–99)
Potassium: 4.5 mmol/L (ref 3.5–5.1)
Sodium: 136 mmol/L (ref 135–145)

## 2020-01-31 NOTE — TOC Progression Note (Signed)
Transition of Care [facility]) - Progression Note    Patient Details  Name: Alexander Alvarado MRN: [id number] Date of Birth: Mar 15, 1941  Transition of [facility] CM/SW Contact  Boris Sharper, [place] Phone Number: 01/31/2020, 3:53 PM  Clinical Narrative:    MD made CSW aware that pt could be discharged. CSW contacted Otila Kluver at [facility] and received notice that she was out today and to contact [place]. CSW contacted [place] with the number provided and left her a message. At this time CSW has not heard from [place] at [facility].   TOC will continue to follow    Expected Discharge Plan: [place]    Expected Discharge Plan and Services Expected Discharge Plan: [place]   Discharge Planning Services: CM Consult Post Acute Care Choice: [place] Living arrangements for the past 2 months: Single Family Home                                       Social Determinants of Health (SDOH) Interventions    Readmission Risk Interventions No flowsheet data found.

## 2020-01-31 NOTE — Progress Notes (Signed)
PT Cancellation Note  Patient Details Name: Alexander Alvarado MRN: [id number] DOB: April 24, 1941   Cancelled Treatment:    Reason Eval/Treat Not Completed: Fatigue/lethargy limiting ability to participate  Offered and encouraged session.   Pt reports general fatigue and pain yesterday requesting to remain in bed today.  He declined ex's when offered stating he would try tomorrow.  Chesley Noon 01/31/2020, 2:04 PM

## 2020-01-31 NOTE — Progress Notes (Signed)
PROGRESS NOTE    Alexander Alvarado  [id number] DOB: 08/28/1941 DOA: 01/18/2020 PCP: Glean Hess, MD   Brief Narrative:  Alexander Alvarado a 79 y.o.malewith medical history significant ofhypertension, peripheral vascular diseases/pamputation of second toe and CVA  Brought to ED with concern of left-sided weakness which started this morning.Per patient he has some residual weakness on left side but when he got up this morning he was unable to move his left side.He normally walks with the help of walker since his prior stroke,he could not grasp a cup or walk .Hissymptoms started gradually improving as the day passes. He denies any facial droop, denies any change in his vision. He has left-sided drooping eyelid for a long time.  He was also complaining of worsening pain in his left foot. He has chronic wounds in his left third toe and heel. Apparently seen by in wound care nurse at home and getting treatment with no relief. He denies using any antibiotics or seen a physician for that. He has his second toe amputated due to osteo in the past. He denies any fever or chills. No upper respiratory symptoms. He lives alone and denies any sick contacts. He was also complaining of some increased urinary urgency and frequency. Denies any dysuria, hematuria or lower abdominal pain.  4/10: Patient seen and examined.  Still endorsing a significant amount of pain.  Pain localized to the left lower extremity distal to the level of the knee.  Endorsing 10 out of 10 pain.  4/11: Patient seen and examined.  Still endorsing significant pain however states that it has improved upon repeat questioning.  Multimodal pain regimen initiated yesterday.  Pain localized to the same area, left lower extremity distal to the knee.  Refused physical therapy yesterday.  Encouraged him to work with PT today.   Assessment & Plan:   Active Problems:   PAD (peripheral artery disease) (HCC)  Osteomyelitis of left foot (HCC)   Malnutrition of moderate degree  Left-sided weakness, possible stroke, history of stroke with left-sided deficits  Persistent deficits consistent with recurrent stroke.  Though CT head initially without abnormality. LDL 42. - Pt declines MRI/MRA as well as repeat CT head due to inability to lay flat due to sinus issues. - Aspirin, statin, plavix and risk factor modification to continue. - Continue PT, OT: rec SNF  Left 3rd toe osteomyelitis and left heel ulceration complicated by PAD Intractable pain - s/p revascularization with stents in LLE on 4/6, then left 3rd toe amputation, left heel ulcer debridement 4/7 by Dr. Luana Shu. -Patient continues to endorse severe pain left lower extremity  - Does admit to improvement in pain control after initiation of multimodal pain regimen - Has been refusing PT secondary to pain, I encouraged him to work with therapy services today as continued immobility will only worsen the issue.  Patient understood Plan: -Continue Bactrim per sensitivity results, 1 week of p.o. treatment sufficient as source control achieved -Multimodal pain regimen.  Continue gabapentin 300mg  twice daily.  Continue Tylenol 650 mg every 6 hours as needed.  Scheduled ibuprofen 400 mg 3 times daily.  As needed oxycodone 5 mg every 4 hours as needed.  No IV narcotics at this time.  Patient had mild respiratory depression and transient hypoxia yesterday after morphine administration - Continue ASA, added plavix, continue statin.  - Anticipate patient will be medically ready to discharge by Monday 4/12  Prediabetes:  - CBGs at inpatient goal without insulin. HbA1c consistent with prediabetes at  5.8%.  Tobacco use: Long term.  - Cessation counseling provided  Acute hypoxic respiratory failure:  Unclear etiology though patient has long history of tobacco use and has required supplemental oxygen in past admissions, had aspiration pneumonia.  - At  this time likely driven by narcotic-induced respiratory depression - Consider sleep study as outpatient.  HTN:  - Restarted home amlodipine, hydralazine  Urinary urgency, frequency:  - Negative UA.  - Bladder scan prn.  Insomnia:  - Continue trazodone 100mg , melatonin 5mg    DVT prophylaxis: LMWH Code Status: DNR Family Communication: offered, patient declined Disposition Plan: Anticipate SNF.  Case management aware.  Pending SNF acceptance and insurance authorization  Consultants:   Podiatry  Vascular  ID  Procedures:  01/26/20 Lower Extremity Angiography Schnier, Dolores Lory, MD  01/26/20 LOWER EXTREMITY INTERVENTION Schnier, Dolores Lory, MD  01/27/20 AMPUTATION RAY Partial 3rd toe amputation, Left heel debridement Caroline More, DPM     Antimicrobials:   Bactrim   Subjective: Seen and examined Continues to endorse severe LLE pain  Objective: Vitals:   01/30/20 1632 01/30/20 2132 01/30/20 2136 01/30/20 2308  BP: 116/77 118/68 118/68 135/62  Pulse: 71  (!) 56 (!) 58  Resp: 16     Temp: 98.3 F (36.8 C)   97.6 F (36.4 C)  TempSrc: Oral   Oral  SpO2: 90%   98%  Weight:      Height:        Intake/Output Summary (Last 24 hours) at 01/31/2020 1108 Last data filed at 01/30/2020 2157 Gross per 24 hour  Intake 237 ml  Output 600 ml  Net -363 ml   Filed Weights   01/18/20 1244 01/19/20 1610  Weight: 68 kg 73.8 kg    Examination:  Gen: 79 y.o. male in no distress Pulm: Nonlabored breathing room air. Clear. CV: Regular rate and rhythm. No murmur, rub, or gallop. No JVD, no dependent edema. GI: Abdomen soft, non-tender, non-distended, with normoactive bowel sounds.  Ext: Warm, no deformities Skin: No rashes, lesions or ulcers on visualized skin. Left foot dressing not removed today. Neuro: Alert and oriented. Left arm/hand weakness, slight ptosis on left, unchanged, no new focal neurological deficits. Psych: Judgement and insight appear fair. Mood euthymic &  affect congruent. Behavior is appropriate.      Data Reviewed: I have personally reviewed following labs and imaging studies  CBC: Recent Labs  Lab 01/27/20 0500 01/28/20 0453 01/29/20 0553 01/31/20 0835  WBC 15.0* 16.6* 12.2* 10.7*  NEUTROABS  --   --   --  7.6  HGB 15.7 15.0 14.6 16.0  HCT 47.3 46.1 45.4 50.0  MCV 92.6 94.5 94.8 94.0  PLT 318 313 307 0000000   Basic Metabolic Panel: Recent Labs  Lab 01/26/20 0502 01/27/20 0500 01/28/20 0453 01/29/20 0553 01/31/20 0835  NA 137 137 136 139 136  K 4.0 4.1 4.4 4.2 4.5  CL 103 102 104 105 102  CO2 27 26 25 27 26   GLUCOSE 96 148* 139* 84 113*  BUN 10 13 17 20 22   CREATININE 0.54* 0.52* 0.58* 0.62 0.74  CALCIUM 8.6* 8.5* 8.4* 8.6* 8.5*  MG  --  2.0  --   --   --    GFR: Estimated Creatinine Clearance: 81.1 mL/min (by C-G formula based on SCr of 0.74 mg/dL). Liver Function Tests: No results for input(s): AST, ALT, ALKPHOS, BILITOT, PROT, ALBUMIN in the last 168 hours. No results for input(s): LIPASE, AMYLASE in the last 168 hours. No results for  input(s): AMMONIA in the last 168 hours. Coagulation Profile: No results for input(s): INR, PROTIME in the last 168 hours. Cardiac Enzymes: No results for input(s): CKTOTAL, CKMB, CKMBINDEX, TROPONINI in the last 168 hours. BNP (last 3 results) No results for input(s): PROBNP in the last 8760 hours. HbA1C: No results for input(s): HGBA1C in the last 72 hours. CBG: No results for input(s): GLUCAP in the last 168 hours. Lipid Profile: No results for input(s): CHOL, HDL, LDLCALC, TRIG, CHOLHDL, LDLDIRECT in the last 72 hours. Thyroid Function Tests: No results for input(s): TSH, T4TOTAL, FREET4, T3FREE, THYROIDAB in the last 72 hours. Anemia Panel: No results for input(s): VITAMINB12, FOLATE, FERRITIN, TIBC, IRON, RETICCTPCT in the last 72 hours. Sepsis Labs: No results for input(s): PROCALCITON, LATICACIDVEN in the last 168 hours.  Recent Results (from the past 240 hour(s))    MRSA PCR Screening     Status: Abnormal   Collection Time: 01/25/20 10:00 PM   Specimen: Nasopharyngeal  Result Value Ref Range Status   MRSA by PCR POSITIVE (A) NEGATIVE Final    Comment:        The GeneXpert MRSA Assay (FDA approved for NASAL specimens only), is one component of a comprehensive MRSA colonization surveillance program. It is not intended to diagnose MRSA infection nor to guide or monitor treatment for MRSA infections. RESULT CALLED TO, READ BACK BY AND VERIFIED WITH: Azzie Almas RN [phone] 01/25/20 HNM Performed at [facility], [address] [place], [place] [address]   Aerobic/Anaerobic Culture (surgical/deep wound)     Status: None (Preliminary result)   Collection Time: 01/27/20 12:52 PM   Specimen: PATH Digit amputation; Tissue  Result Value Ref Range Status   Specimen Description   Final    TOE GANGRENE LEFT 3RD TOE Performed at [facility], [address] [place], [place] [address]    Special Requests   Final    NONE Performed at [facility], [place], [place] [address]    Gram Stain   Final    ABUNDANT WBC PRESENT,BOTH PMN AND MONONUCLEAR FEW GRAM POSITIVE COCCI FEW GRAM POSITIVE RODS Performed at [facility], [place] [address], [place], [place] [address]    Culture   Final    FEW METHICILLIN RESISTANT STAPHYLOCOCCUS AUREUS FEW ENTEROCOCCUS FAECALIS SUSCEPTIBILITIES TO FOLLOW NO ANAEROBES ISOLATED; CULTURE IN PROGRESS FOR 5 DAYS    Report Status PENDING  Incomplete   Organism ID, Bacteria METHICILLIN RESISTANT STAPHYLOCOCCUS AUREUS  Final      Susceptibility   Methicillin resistant staphylococcus aureus - MIC*    CIPROFLOXACIN >=8 RESISTANT Resistant     ERYTHROMYCIN >=8 RESISTANT Resistant     GENTAMICIN <=0.5 SENSITIVE Sensitive     OXACILLIN >=4 RESISTANT Resistant     TETRACYCLINE <=1 SENSITIVE Sensitive     VANCOMYCIN <=0.5 SENSITIVE Sensitive     TRIMETH/SULFA <=10 SENSITIVE  Sensitive     CLINDAMYCIN <=0.25 SENSITIVE Sensitive     RIFAMPIN <=0.5 SENSITIVE Sensitive     Inducible Clindamycin NEGATIVE Sensitive     * FEW METHICILLIN RESISTANT STAPHYLOCOCCUS AUREUS  SARS CORONAVIRUS 2 (TAT 6-24 HRS) Nasopharyngeal Nasopharyngeal Swab     Status: None   Collection Time: 01/27/20  1:54 PM   Specimen: Nasopharyngeal Swab  Result Value Ref Range Status   SARS Coronavirus 2 NEGATIVE NEGATIVE Final    Comment: (NOTE) SARS-CoV-2 target nucleic acids are NOT DETECTED. The SARS-CoV-2 RNA is generally detectable in upper and lower respiratory specimens during  the acute phase of infection. Negative results do not preclude SARS-CoV-2 infection, do not rule out co-infections with other pathogens, and should not be used as the sole basis for treatment or other patient management decisions. Negative results must be combined with clinical observations, patient history, and epidemiological information. The expected result is Negative. Fact Sheet for Patients: SugarRoll.be Fact Sheet for Healthcare Providers: https://www.woods-mathews.com/ This test is not yet approved or cleared by the [place] FDA and  has been authorized for detection and/or diagnosis of SARS-CoV-2 by FDA under an Emergency Use Authorization (EUA). This EUA will remain  in effect (meaning this test can be used) for the duration of the COVID-19 declaration under Section 56 4(b)(1) of the Act, 21 U.S.C. section 360bbb-3(b)(1), unless the authorization is terminated or revoked sooner. Performed at [facility], [place] [address] [place], [place] [address]          Radiology Studies: No results found.      Scheduled Meds: . acetaminophen  500 mg Oral TID  . amLODipine  10 mg Oral Daily  . aspirin EC  81 mg Oral Daily  . atorvastatin  20 mg Oral QHS  . clopidogrel  75 mg Oral Daily  . enoxaparin (LOVENOX) injection  40 mg Subcutaneous Q24H    . feeding supplement (ENSURE ENLIVE)  237 mL Oral TID BM  . folic acid  1 mg Oral Daily  . gabapentin  300 mg Oral BID  . hydrALAZINE  25 mg Oral BID  . ibuprofen  400 mg Oral TID  . melatonin  5 mg Oral QHS  . multivitamin-lutein  1 capsule Oral Daily  . pantoprazole  40 mg Oral Daily  . sulfamethoxazole-trimethoprim  1 tablet Oral Q12H  . traZODone  100 mg Oral QHS  . vitamin B-12  1,000 mcg Oral Daily   Continuous Infusions: . sodium chloride 1,000 mL (01/26/20 1151)  . sodium chloride       LOS: 13 days    Time spent:35 min    Sidney Ace, MD [facility] Pager [phone]  If 7PM-7AM, please contact night-coverage 01/31/2020, 11:08 AM

## 2020-02-01 DIAGNOSIS — Z4781 Encounter for orthopedic aftercare following surgical amputation: Secondary | ICD-10-CM | POA: Diagnosis not present

## 2020-02-01 DIAGNOSIS — R531 Weakness: Secondary | ICD-10-CM | POA: Diagnosis not present

## 2020-02-01 DIAGNOSIS — M869 Osteomyelitis, unspecified: Secondary | ICD-10-CM | POA: Diagnosis not present

## 2020-02-01 DIAGNOSIS — R2681 Unsteadiness on feet: Secondary | ICD-10-CM | POA: Diagnosis not present

## 2020-02-01 DIAGNOSIS — I739 Peripheral vascular disease, unspecified: Secondary | ICD-10-CM | POA: Diagnosis not present

## 2020-02-01 DIAGNOSIS — R262 Difficulty in walking, not elsewhere classified: Secondary | ICD-10-CM | POA: Diagnosis not present

## 2020-02-01 DIAGNOSIS — M6281 Muscle weakness (generalized): Secondary | ICD-10-CM | POA: Diagnosis not present

## 2020-02-01 DIAGNOSIS — E440 Moderate protein-calorie malnutrition: Secondary | ICD-10-CM | POA: Diagnosis not present

## 2020-02-01 DIAGNOSIS — G629 Polyneuropathy, unspecified: Secondary | ICD-10-CM | POA: Diagnosis not present

## 2020-02-01 DIAGNOSIS — R29898 Other symptoms and signs involving the musculoskeletal system: Secondary | ICD-10-CM | POA: Diagnosis not present

## 2020-02-01 DIAGNOSIS — R0902 Hypoxemia: Secondary | ICD-10-CM | POA: Diagnosis not present

## 2020-02-01 DIAGNOSIS — M2550 Pain in unspecified joint: Secondary | ICD-10-CM | POA: Diagnosis not present

## 2020-02-01 DIAGNOSIS — B9562 Methicillin resistant Staphylococcus aureus infection as the cause of diseases classified elsewhere: Secondary | ICD-10-CM | POA: Diagnosis not present

## 2020-02-01 DIAGNOSIS — M86172 Other acute osteomyelitis, left ankle and foot: Secondary | ICD-10-CM | POA: Diagnosis not present

## 2020-02-01 DIAGNOSIS — Z89422 Acquired absence of other left toe(s): Secondary | ICD-10-CM | POA: Diagnosis not present

## 2020-02-01 DIAGNOSIS — J9621 Acute and chronic respiratory failure with hypoxia: Secondary | ICD-10-CM | POA: Diagnosis not present

## 2020-02-01 DIAGNOSIS — Z720 Tobacco use: Secondary | ICD-10-CM | POA: Diagnosis not present

## 2020-02-01 DIAGNOSIS — E782 Mixed hyperlipidemia: Secondary | ICD-10-CM | POA: Diagnosis not present

## 2020-02-01 DIAGNOSIS — R7309 Other abnormal glucose: Secondary | ICD-10-CM | POA: Diagnosis not present

## 2020-02-01 DIAGNOSIS — I10 Essential (primary) hypertension: Secondary | ICD-10-CM | POA: Diagnosis not present

## 2020-02-01 DIAGNOSIS — L97422 Non-pressure chronic ulcer of left heel and midfoot with fat layer exposed: Secondary | ICD-10-CM | POA: Diagnosis not present

## 2020-02-01 DIAGNOSIS — M19029 Primary osteoarthritis, unspecified elbow: Secondary | ICD-10-CM | POA: Diagnosis not present

## 2020-02-01 DIAGNOSIS — Z95820 Peripheral vascular angioplasty status with implants and grafts: Secondary | ICD-10-CM | POA: Diagnosis not present

## 2020-02-01 DIAGNOSIS — Z7401 Bed confinement status: Secondary | ICD-10-CM | POA: Diagnosis not present

## 2020-02-01 DIAGNOSIS — D649 Anemia, unspecified: Secondary | ICD-10-CM | POA: Diagnosis not present

## 2020-02-01 LAB — AEROBIC/ANAEROBIC CULTURE W GRAM STAIN (SURGICAL/DEEP WOUND)

## 2020-02-01 LAB — RESPIRATORY PANEL BY RT PCR (FLU A&B, COVID)
Influenza A by PCR: NEGATIVE
Influenza B by PCR: NEGATIVE
SARS Coronavirus 2 by RT PCR: NEGATIVE

## 2020-02-01 LAB — MRSA PCR SCREENING: MRSA by PCR: NEGATIVE

## 2020-02-01 MED ORDER — RAMELTEON 8 MG PO TABS: 8.0000 mg | ORAL_TABLET | Freq: Every day | ORAL | Status: AC

## 2020-02-01 MED ORDER — IBUPROFEN 400 MG PO TABS: 400.0000 mg | ORAL_TABLET | Freq: Three times a day (TID) | ORAL | 0 refills | Status: AC

## 2020-02-01 MED ORDER — OXYCODONE HCL 5 MG PO TABS: 5.0000 mg | ORAL_TABLET | ORAL | 0 refills | Status: AC | PRN

## 2020-02-01 MED ORDER — QUETIAPINE FUMARATE 25 MG PO TABS: 25.0000 mg | ORAL_TABLET | Freq: Every evening | ORAL | Status: AC | PRN

## 2020-02-01 MED ORDER — SULFAMETHOXAZOLE-TRIMETHOPRIM 800-160 MG PO TABS: 1.0000 | ORAL_TABLET | Freq: Two times a day (BID) | ORAL | Status: AC

## 2020-02-01 MED ORDER — CLOPIDOGREL BISULFATE 75 MG PO TABS: 75.0000 mg | ORAL_TABLET | Freq: Every day | ORAL | Status: AC

## 2020-02-01 MED ORDER — ACETAMINOPHEN 325 MG PO TABS: 650.0000 mg | ORAL_TABLET | Freq: Four times a day (QID) | ORAL | Status: AC | PRN

## 2020-02-01 MED ORDER — TRAZODONE HCL 50 MG PO TABS: 50.0000 mg | ORAL_TABLET | Freq: Every evening | ORAL | Status: AC | PRN

## 2020-02-01 MED ORDER — RAMELTEON 8 MG PO TABS
8.0000 mg | ORAL_TABLET | Freq: Every day | ORAL | Status: DC
  Filled 2020-02-01 (×2): qty 1

## 2020-02-01 MED ORDER — TRAZODONE HCL 100 MG PO TABS: 100.0000 mg | ORAL_TABLET | Freq: Every day | ORAL | Status: AC

## 2020-02-01 MED ORDER — GABAPENTIN 300 MG PO CAPS: 300.0000 mg | ORAL_CAPSULE | Freq: Two times a day (BID) | ORAL | Status: AC

## 2020-02-01 NOTE — Progress Notes (Signed)
Physical Therapy Treatment Patient Details Name: Alexander Alvarado MRN: [id number] DOB: 02-05-1941 Today's Date: 02/01/2020    History of Present Illness Pt is a 79 y.o. male who presents with multiple medical issues.  Patient arrived via EMS due to left-sided weakness and concern for potential CVA.  Patient was also noted to have a ulceration to the dorsal aspect of the left third toe with bone exposed as well as posterior aspect left heel.  Patient notes symptoms have been present for for about 2 months.  States they have progressively worsened over the last 2 months, becoming larger in the size and foul-smelling. Pt now s/p  angiogram with vascular 01/26/20 and s/p L third ray partial amputation with order for post-op shoe and partial WB as of 01/27/20.  PMH includes CVA with residual L sided weakness, PVD, HTN, HLD, R hip hemiarthroplastly.    PT Comments    Pt agrees to session today.  To EOB with min a x 1 and increased time/rails.  Stood to RW x 2 with min/mod a x 2 but he is unable to step with RW to recliner and +1 assist without knees buckling.  Stand pivot to recliner with min a x 1.  Participated in exercises as described below.  Pt tolerated activity well but fatigued with activity. Pt reports continued L side weakness which he feels is primary limiting factor with mobility.  "It's like it's dead."   Follow Up Recommendations  SNF     Equipment Recommendations  Rolling walker with 5" wheels    Recommendations for Other Services       Precautions / Restrictions Precautions Precautions: Fall Restrictions Weight Bearing Restrictions: Yes LLE Weight Bearing: Partial weight bearing Other Position/Activity Restrictions: prevalon boot to L LE in bed, post-op shoe to L LE for OOB activity    Mobility  Bed Mobility Overal bed mobility: Needs Assistance Bed Mobility: Supine to Sit     Supine to sit: Min assist        Transfers Overall transfer level: Needs  assistance Equipment used: Rolling walker (2 wheeled);None Transfers: Sit to/from [employer] to Stand: Min assist Stand pivot transfers: Min assist       General transfer comment: uanble to step with RW without knees buckling  Ambulation/Gait             General Gait Details: unable   Stairs             Wheelchair Mobility    Modified Rankin (Stroke Patients Only)       Balance Overall balance assessment: Needs assistance Sitting-balance support: Feet supported;Single extremity supported Sitting balance-[name]: Fair     Standing balance support: Bilateral upper extremity supported;During functional activity Standing balance-[name]: Poor Standing balance comment: Min A to prevent LOB in standing                            Cognition Arousal/Alertness: Awake/alert Behavior During Therapy: WFL for tasks assessed/performed;Flat affect                                   General Comments: Pt engaged throughout session, but does, on more than one occasion, calmly express that he does not want to live any longer. He does not feel upset about this, but does indicate that he feels he is at the end of his life.  He expresses that he feels his useful time on this earth has passed.      Exercises Other Exercises Other Exercises: Seated 2 x 10 ankle pumps, LAQ, marches and pillow squeeze    General Comments        Pertinent Vitals/Pain Pain Assessment: Faces Faces Pain Scale: Hurts little more Pain Location: L foot Pain Descriptors / Indicators: Aching;Sore Pain Intervention(s): Limited activity within patient's tolerance;Monitored during session;Repositioned    Home Living                      Prior Function            PT Goals (current goals can now be found in the care plan section) Progress towards PT goals: Progressing toward goals    Frequency    7X/week      PT Plan Current  plan remains appropriate    Co-evaluation              AM-PAC PT "6 Clicks" Mobility   Outcome Measure  Help needed turning from your back to your side while in a flat bed without using bedrails?: A Little Help needed moving from lying on your back to sitting on the side of a flat bed without using bedrails?: A Little Help needed moving to and from a bed to a chair (including a wheelchair)?: A Lot Help needed standing up from a chair using your arms (e.g., wheelchair or bedside chair)?: A Little Help needed to walk in hospital room?: Total Help needed climbing 3-5 steps with a railing? : Total 6 Click Score: 13    End of Session Equipment Utilized During Treatment: Gait belt Activity Tolerance: Patient tolerated treatment well Patient left: in chair;with call bell/phone within reach;with chair alarm set Nurse Communication: Mobility status Pain - Right/Left: Left Pain - part of body: Ankle and joints of foot     Time: 0912-0925 PT Time Calculation (min) (ACUTE ONLY): 13 min  Charges:  $Therapeutic Exercise: 8-22 mins                    Chesley Noon, PTA 02/01/20, 9:33 AM

## 2020-02-01 NOTE — TOC Progression Note (Signed)
Transition of Care [facility]) - Progression Note    Patient Details  Name: RHIAN YEARTA MRN: [id number] Date of Birth: June 04, 1941  [facility] CM/SW Contact  Su Hilt, RN Phone Number: 02/01/2020, 9:54 AM  Clinical Narrative:    Thomasenia Bottoms at [facility] to inquire about insurance auth,  They have gotten auth over the weekend, I notified the physician that insurance Josem Kaufmann is in place   Expected Discharge Plan: [place]    Expected Discharge Plan and Services Expected Discharge Plan: [place]   Discharge Planning Services: CM Consult Post Acute Care Choice: [place] Living arrangements for the past 2 months: Single Family Home                                       Social Determinants of Health (SDOH) Interventions    Readmission Risk Interventions No flowsheet data found.

## 2020-02-01 NOTE — Progress Notes (Signed)
Patient discharged per MD order. Report called to Deatra Ina RN at facility. EMS called for transport.

## 2020-02-01 NOTE — Discharge Summary (Signed)
Physician Discharge Summary  Alexander Alvarado [id number] DOB: October 01, 1941 DOA: 01/18/2020  PCP: Alexander Hess, MD  Admit date: 01/18/2020 Discharge date: 02/01/2020  Admitted From: Home Disposition:  SNF  Recommendations for Outpatient Follow-up:  1. Follow up with PCP in 1-2 weeks 2. Follow up with podiatry 3. Follow up with vascular surgery  Home Health:None Equipment/Devices:None  Discharge Condition:Stable CODE STATUS:DNR Diet recommendation: Heart Healthy / Carb Modified  Brief/Interim Summary: Alexander Huseby Hunteris a 79 y.o.malewith medical history significant ofhypertension, peripheral vascular diseases/pamputation of second toe and CVA  Brought to ED with concern of left-sided weakness which started this morning.Per patient he has some residual weakness on left side but when he got up this morning he was unable to move his left side.He normally walks with the help of walker since his prior stroke,he could not grasp a cup or walk .Hissymptoms started gradually improving as the day passes. He denies any facial droop, denies any change in his vision. He has left-sided drooping eyelid for a long time.  He was also complaining of worsening pain in his left foot. He has chronic wounds in his left third toe and heel. Apparently seen by in wound care nurse at home and getting treatment with no relief. He denies using any antibiotics or seen a physician for that. He has his second toe amputated due to osteo in the past. He denies any fever or chills. No upper respiratory symptoms. He lives alone and denies any sick contacts. He was also complaining of some increased urinary urgency and frequency. Denies any dysuria, hematuria or lower abdominal pain.  4/10: Patient seen and examined.  Still endorsing a significant amount of pain.  Pain localized to the left lower extremity distal to the level of the knee.  Endorsing 10 out of 10 pain.  4/11: Patient seen and  examined.  Still endorsing significant pain however states that it has improved upon repeat questioning.  Multimodal pain regimen initiated yesterday.  Pain localized to the same area, left lower extremity distal to the knee.  Refused physical therapy yesterday.  Encouraged him to work with PT today.  4/12: Patient seen and examined.  Still endorsing significant pain however had a lengthy conversation with him.  The patient has been intermittently refusing physical therapy services in house claiming significant fatigue and pain.  However I explained to him that without effectively in intensive rehabilitation his pain is unlikely to improve and his left lower extremity would likely worsen over time.  He continued to be frustrated however expressed understanding.  Insurance authorization has been received.  We are pending bed availability at [place].  Patient is medically stable for discharge today.  Post discharge he will follow up with primary care, vascular surgery, podiatry.  Discharge Diagnoses:  Active Problems:   PAD (peripheral artery disease) (HCC)   Osteomyelitis of left foot (HCC)   Malnutrition of moderate degree  Left-sided weakness, possible stroke, history of stroke with left-sided deficits  Persistent deficits consistent with recurrent stroke.  Though CT head initially without abnormality. LDL 42. - Pt declines MRI/MRA as well as repeat CT head due to inability to lay flat due to sinus issues. - Aspirin, statin, plavix and risk factor modification to continue. - Continue PT, OT: rec SNF  Left 3rd toe osteomyelitis and left heel ulceration complicated by PAD Intractable pain - s/p revascularization with stents in LLE on 4/6, then left 3rd toe amputation, left heel ulcer debridement 4/7 by Dr. Luana Alvarado. -Patient continues  to endorse severe pain left lower extremity  - Does admit to improvement in pain control after initiation of multimodal pain regimen - Has been refusing  PT secondary to pain, I encouraged him to work with therapy services today as continued immobility will only worsen the issue.  Patient understood Plan: -Continue Bactrim per sensitivity results, 1 week of p.o. treatment sufficient as source control achieved -Multimodal pain regimen.  Continue gabapentin 300mg  twice daily.  Continue Tylenol 650 mg every 6 hours as needed.  Scheduled ibuprofen 400 mg 3 times daily.  As needed oxycodone 5 mg every 4 hours as needed.  No IV narcotics at this time.  Patient had mild respiratory depression and transient hypoxia yesterday after morphine administration - Continue ASA, added plavix, continue statin.  - Medically stable for discharge on above regimen  Prediabetes:  - CBGs at inpatient goal without insulin. HbA1c consistent with prediabetes at 5.8%.  Tobacco use: Long term.  - Cessation counseling provided  Acute hypoxic respiratory failure:  Unclear etiology though patient has long history of tobacco use and has required supplemental oxygen in past admissions, had aspiration pneumonia.  - At this time likely driven by narcotic-induced respiratory depression - Consider sleep study as outpatient.  HTN:  - Restarted home amlodipine, hydralazine  Urinary urgency, frequency:  - Negative UA.  - Bladder scan prn.  Insomnia:  - Continue trazodone 100mg , melatonin 5mg  - PRN seroquel  Discharge Instructions  Discharge Instructions    Diet - low sodium heart healthy   Complete by: As directed    Increase activity slowly   Complete by: As directed      Allergies as of 02/01/2020   No Known Allergies     Medication List    TAKE these medications   acetaminophen 325 MG tablet Commonly known as: Tylenol Take 2 tablets (650 mg total) by mouth every 6 (six) hours as needed for mild pain. What changed: Another medication with the same name was added. Make sure you understand how and when to take each.   acetaminophen 325 MG  tablet Commonly known as: TYLENOL Take 2 tablets (650 mg total) by mouth every 6 (six) hours as needed for mild pain (or Fever >/= 101). What changed: You were already taking a medication with the same name, and this prescription was added. Make sure you understand how and when to take each.   amLODipine 10 MG tablet Commonly known as: NORVASC Take 1 tablet (10 mg total) by mouth daily.   aspirin 81 MG tablet Take 81 mg by mouth daily.   atorvastatin 20 MG tablet Commonly known as: LIPITOR TAKE 1 TABLET BY MOUTH AT BEDTIME   clopidogrel 75 MG tablet Commonly known as: PLAVIX Take 1 tablet (75 mg total) by mouth daily. Start taking on: February 02, 2020   cyanocobalamin 1000 MCG tablet Take 1,000 mcg by mouth daily.   folic acid 1 MG tablet Commonly known as: FOLVITE Take 1 mg by mouth daily.   gabapentin 300 MG capsule Commonly known as: NEURONTIN Take 1 capsule (300 mg total) by mouth 2 (two) times daily.   hydrALAZINE 25 MG tablet Commonly known as: APRESOLINE Take 1 tablet (25 mg total) by mouth 2 (two) times daily.   ibuprofen 400 MG tablet Commonly known as: ADVIL Take 1 tablet (400 mg total) by mouth 3 (three) times daily.   metoprolol tartrate 25 MG tablet Commonly known as: LOPRESSOR Take 1 tablet (25 mg total) by mouth 2 (two) times daily.  omeprazole 40 MG capsule Commonly known as: PRILOSEC Take 1 capsule (40 mg total) by mouth daily.   oxyCODONE 5 MG immediate release tablet Commonly known as: Oxy IR/ROXICODONE Take 1 tablet (5 mg total) by mouth every 4 (four) hours as needed for up to 5 days for severe pain. What changed:   when to take this  reasons to take this   QUEtiapine 25 MG tablet Commonly known as: SEROQUEL Take 1 tablet (25 mg total) by mouth at bedtime as needed (Sleep).   quinapril 40 MG tablet Commonly known as: ACCUPRIL Take 1 tablet (40 mg total) by mouth daily.   ramelteon 8 MG tablet Commonly known as: ROZEREM Take 1  tablet (8 mg total) by mouth at bedtime.   sulfamethoxazole-trimethoprim 800-160 MG tablet Commonly known as: BACTRIM DS Take 1 tablet by mouth every 12 (twelve) hours.   traZODone 100 MG tablet Commonly known as: DESYREL Take 1 tablet (100 mg total) by mouth at bedtime.   traZODone 50 MG tablet Commonly known as: DESYREL Take 1 tablet (50 mg total) by mouth at bedtime as needed for sleep.       Contact information for follow-up providers    Schnier, Dolores Lory, MD On 03/03/2020.   Specialties: Vascular Surgery, Cardiology, Radiology, Vascular Surgery Why: Seen as consult. Can see Civil engineer, contracting or Arna Medici. Will need ABI with visit. @ 1:00 pm Contact information: [place] [address] [id number]        Caroline More, DPM. Schedule an appointment as soon as possible for a visit on 02/08/2020.   Specialty: Podiatry Why: @ 1:45 pm Contact information: [place] [address] [phone]            Contact information for after-discharge care    [facility] .   Service: Skilled Nursing Contact information: [place] [phone]                 No Known Allergies  Consultations:  Podiatry  Vascular surgery   Procedures/Studies: DG Chest 2 View  Result Date: 01/22/2020 CLINICAL DATA:  Hypoxia, CHF, hypertension, smoker EXAM: CHEST - 2 VIEW COMPARISON:  09/11/2019 FINDINGS: Normal heart size, mediastinal contours, and pulmonary vascularity. Atherosclerotic calcification aorta. Chronic thickening of interstitial markings. No acute infiltrate, pleural effusion or pneumothorax. Bones demineralized. IMPRESSION: Chronic interstitial prominence. No acute abnormalities. Electronically Signed   By: Lavonia Dana M.D.   On: 01/22/2020 14:01   CT Head Wo Contrast  Result Date: 01/18/2020 CLINICAL DATA:  Left-sided weakness EXAM: CT HEAD WITHOUT CONTRAST  TECHNIQUE: Contiguous axial images were obtained from the base of the skull through the vertex without intravenous contrast. COMPARISON:  CT brain 09/10/2019 FINDINGS: Brain: No acute territorial infarction, hemorrhage or intracranial mass. Moderate atrophy. Chronic infarcts in the left cerebellum, right greater than left basal ganglia, and right white matter. Hypodensity in the white matter consistent with chronic small vessel ischemic change. Stable ventricular size with mild ex vacuo dilatation of right frontal horn. Vascular: No hyperdense vessels. Vertebral and carotid vascular calcification. Skull: Normal. Negative for fracture or focal lesion. Sinuses/Orbits: No acute finding. Other: None IMPRESSION: 1. No CT evidence for acute intracranial abnormality. 2. Atrophy and chronic small vessel ischemic changes of the white matter. Chronic multifocal lacunar infarcts. Electronically Signed   By: Donavan Foil M.D.   On: 01/18/2020 17:02   PERIPHERAL VASCULAR CATHETERIZATION  Result Date: 01/26/2020 See Op Note  US ARTERIAL ABI (SCREENING LOWER EXTREMITY)  Result Date: 01/19/2020 CLINICAL DATA:  79 year old male with peripheral arterial disease and bilateral surgical bypasses. EXAM: NONINVASIVE PHYSIOLOGIC VASCULAR STUDY OF BILATERAL LOWER EXTREMITIES TECHNIQUE: Evaluation of both lower extremities were performed at rest, including calculation of ankle-brachial indices with single level Doppler, pressure and pulse volume recording. COMPARISON:  None. FINDINGS: Right ABI:  0.61 Left ABI:  0.53 Right Lower Extremity: Abnormal biphasic arterial waveforms at the ankle. Left Lower Extremity: Abnormal monophasic arterial waveforms at the ankle. 0.5-0.79 Moderate PAD IMPRESSION: Abnormal bilateral resting ankle-brachial indices consistent with at least moderate underlying peripheral arterial disease. The abnormalities are slightly worse on the left than the right. Signed, Criselda Peaches, MD, [facility] Electronically Signed   By: Jacqulynn Cadet M.D.   On: 01/19/2020 14:10   DG Foot Complete Left  Result Date: 01/28/2020 CLINICAL DATA:  79 year old male postop day 1 partial 3rd toe amputation, left heel debridement. EXAM: LEFT FOOT - COMPLETE 3+ VIEW COMPARISON:  Left foot series 01/18/2020. FINDINGS: Left 3rd phalanges are now surgically absent. Possible associated osteotomy of the head of the 3rd metatarsal, although the surgical margin appears indistinct as seen on image 2. No regional soft tissue gas. Pre-existing partial left 2nd toe amputation. There has been loss of cortical definition also at the lateral 2nd metatarsal head. Other metatarsals and phalanges appear stable. Tarsal bones appear intact. Calcaneus intact. IMPRESSION: 1. Left 3rd phalanges now surgically absent. 2. Difficult to exclude Active Osteomyelitis in the heads of the 2nd and 3rd metatarsals, although the appearance might reflect surgical debridement. Correlation with operative findings requested. If clinically equivocal, short interval follow-up radiographs would be valuable. Electronically Signed   By: Genevie Ann M.D.   On: 01/28/2020 17:05   DG Foot Complete Left  Result Date: 01/18/2020 CLINICAL DATA:  Dorsal left third toe wound. Neuropathy EXAM: LEFT FOOT - COMPLETE 3+ VIEW COMPARISON:  None. FINDINGS: Prior second digit amputation at the proximal phalanx. Cortical irregularity involving the dorsal cortex of the third digit proximal phalanx distally. There is a hammertoe deformity involving the third digit. No fracture is seen. No dislocation. Soft tissue swelling without evidence of soft tissue gas. IMPRESSION: Cortical irregularity involving the dorsal cortex of the third digit proximal phalanx raising the suspicion for osteomyelitis in the setting of dorsal skin wound. If further imaging is clinically warranted, an MRI could be performed. Electronically Signed   By:  Davina Poke D.O.   On: 01/18/2020 14:00   ECHOCARDIOGRAM COMPLETE  Result Date: 01/19/2020    ECHOCARDIOGRAM REPORT   Patient Name:   GAURAV ROHLMAN Date of Exam: 01/19/2020 Medical Rec #:  [id number]       Height:       70.0 in Accession #:    [id number]      Weight:       150.0 lb Date of Birth:  [birth year]      BSA:          1.847 m Patient Age:    42 years        BP:           135/66 mmHg Patient Gender: M               HR:           72 bpm. Exam Location:  [facility] Procedure: 2D Echo, Color Doppler and Cardiac Doppler Indications:     I163.9 Stroke  History:  Patient has no prior history of Echocardiogram examinations.                  PVD, Signs/Symptoms:Left sided weakness; Risk                  Factors:Hypertension and HCL.  Sonographer:     Charmayne Sheer RDCS (AE) Referring Phys:  [id number] [facility] AMIN Diagnosing Phys: Harrell Gave End MD  Sonographer Comments: Suboptimal parasternal window and suboptimal subcostal window. IMPRESSIONS  1. Left ventricular ejection fraction, by estimation, is 45%. The left ventricle has mild to moderately decreased function. The left ventricle demonstrates global hypokinesis. Left ventricular diastolic parameters are consistent with Grade I diastolic dysfunction (impaired relaxation).  2. Right ventricular systolic function is normal. The right ventricular size is normal. Tricuspid regurgitation signal is inadequate for assessing PA pressure.  3. The mitral valve is degenerative. No evidence of mitral valve regurgitation. No evidence of mitral stenosis.  4. The aortic valve was not well visualized. Aortic valve regurgitation is not visualized. Mild aortic valve sclerosis is present, with no evidence of aortic valve stenosis. FINDINGS  Left Ventricle: Left ventricular ejection fraction, by estimation, is 45%. The left ventricle has mild to moderately decreased function. The left ventricle demonstrates global hypokinesis. The left ventricular internal cavity size was  normal in size. There is no left ventricular hypertrophy. Abnormal (paradoxical) septal motion, consistent with left bundle branch block. Left ventricular diastolic parameters are consistent with Grade I diastolic dysfunction (impaired relaxation). Right Ventricle: The right ventricular size is normal. Right vetricular wall thickness was not assessed. Right ventricular systolic function is normal. Tricuspid regurgitation signal is inadequate for assessing PA pressure. Left Atrium: Left atrial size was normal in size. Right Atrium: Right atrial size was normal in size. Pericardium: The pericardium was not well visualized. Mitral Valve: The mitral valve is degenerative in appearance. There is mild thickening of the mitral valve leaflet(s). Moderate mitral annular calcification. No evidence of mitral valve regurgitation. No evidence of mitral valve stenosis. MV peak gradient, 4.2 mmHg. The mean mitral valve gradient is 2.0 mmHg. Tricuspid Valve: The tricuspid valve is not well visualized. Tricuspid valve regurgitation is not demonstrated. Aortic Valve: The aortic valve was not well visualized. Aortic valve regurgitation is not visualized. Mild aortic valve sclerosis is present, with no evidence of aortic valve stenosis. Mild to moderate aortic valve annular calcification. Aortic valve mean gradient measures 3.0 mmHg. Aortic valve peak gradient measures 6.1 mmHg. Aortic valve area, by VTI measures 1.56 cm. Pulmonic Valve: The pulmonic valve was not well visualized. Pulmonic valve regurgitation is not visualized. No evidence of pulmonic stenosis. Aorta: The aortic root is normal in size and structure. Pulmonary Artery: The pulmonary artery is of normal size. Venous: The inferior vena cava was not well visualized. IAS/Shunts: The interatrial septum was not well visualized.  LEFT VENTRICLE PLAX 2D LVIDd:         4.71 cm      Diastology LVIDs:         3.65 cm      LV e' lateral:   5.44 cm/s LV PW:         0.94 cm      LV  E/e' lateral: 11.0 LV IVS:        0.81 cm      LV e' medial:    5.00 cm/s LVOT diam:     2.10 cm      LV E/e' medial:  11.9 LV SV:  32 LV SV Index:   17 LVOT Area:     3.46 cm  LV Volumes (MOD) LV vol d, MOD A4C: 107.0 ml LV vol s, MOD A4C: 49.2 ml LV SV MOD A4C:     107.0 ml LEFT ATRIUM           Index       RIGHT ATRIUM           Index LA diam:      3.90 cm 2.11 cm/m  RA Area:     14.14 cm 7.66 cm/m LA Vol (A4C): 33.2 ml 17.97 ml/m  AORTIC VALVE                   PULMONIC VALVE AV Area (Vmax):    1.54 cm    PV Vmax:       0.88 m/s AV Area (Vmean):   1.60 cm    PV Vmean:      60.300 cm/s AV Area (VTI):     1.56 cm    PV VTI:        0.154 m AV Vmax:           123.00 cm/s PV Peak grad:  3.1 mmHg AV Vmean:          81.200 cm/s PV Mean grad:  2.0 mmHg AV VTI:            0.203 m AV Peak Grad:      6.1 mmHg AV Mean Grad:      3.0 mmHg LVOT Vmax:         54.70 cm/s LVOT Vmean:        37.400 cm/s LVOT VTI:          0.092 m LVOT/AV VTI ratio: 0.45  AORTA Ao Root diam: 3.20 cm MITRAL VALVE MV Area (PHT): 2.91 cm    SHUNTS MV Peak grad:  4.2 mmHg    Systemic VTI:  0.09 m MV Mean grad:  2.0 mmHg    Systemic Diam: 2.10 cm MV Vmax:       1.03 m/s MV Vmean:      60.0 cm/s MV Decel Time: 261 msec MV E velocity: 59.70 cm/s MV A velocity: 78.10 cm/s MV E/A ratio:  0.76 Christopher End MD Electronically signed by Nelva Bush MD Signature Date/Time: 01/19/2020/10:29:16 AM    Final     (Echo, Carotid, EGD, Colonoscopy, ERCP)    Subjective: Seen and examined on the day of discharge Frustrated, feels as if his pain is under not under control I explained that pain control will be an ongoing issue and what he really needs is intensive rehabilitation which we do not have the ability to do here in the hospital Patient expressed understanding.  At this time he is medically stable for discharge to skilled nursing facility  Discharge Exam: Vitals:   01/31/20 2240 02/01/20 0750  BP: (!) 133/58 135/71  Pulse:  63 61  Resp: 17 16  Temp: 98 F (36.7 C) 98.3 F (36.8 C)  SpO2: 98% 94%   Vitals:   01/31/20 1654 01/31/20 2019 01/31/20 2240 02/01/20 0750  BP: 133/84 131/69 (!) 133/58 135/71  Pulse: 90  63 61  Resp: 16  17 16   Temp: (!) 97.4 F (36.3 C)  98 F (36.7 C) 98.3 F (36.8 C)  TempSrc: Oral  Oral Oral  SpO2: 92%  98% 94%  Weight:      Height:       Gen:78 y.o.malein no  distress Pulm: Nonlabored breathing room air. Clear. [id number] rate and rhythm. No murmur, rub, or gallop. No JVD, no dependent edema. [id number] soft, non-tender, non-distended, with normoactive bowel sounds.  Ext: Warm, no deformities Skin: No rashes, lesions or ulcers on visualized skin.Left foot dressing not removed today. Neuro:Alert and oriented. Left arm/hand weakness, slight ptosis on left, unchanged, no newfocal neurological deficits. Psych:Judgement and insight appear fair. Mood euthymic &affect congruent. Behavior is appropriate.      The results of significant diagnostics from this hospitalization (including imaging, microbiology, ancillary and laboratory) are listed below for reference.     Microbiology: Recent Results (from the past 240 hour(s))  MRSA PCR Screening     Status: Abnormal   Collection Time: 01/25/20 10:00 PM   Specimen: Nasopharyngeal  Result Value Ref Range Status   MRSA by PCR POSITIVE (A) NEGATIVE Final    Comment:        The GeneXpert MRSA Assay (FDA approved for NASAL specimens only), is one component of a comprehensive MRSA colonization surveillance program. It is not intended to diagnose MRSA infection nor to guide or monitor treatment for MRSA infections. RESULT CALLED TO, READ BACK BY AND VERIFIED WITH: Azzie Almas RN [phone] 01/25/20 HNM Performed at [facility], [address] [place], [place] [address]   Aerobic/Anaerobic Culture (surgical/deep wound)     Status: None (Preliminary result)   Collection Time: 01/27/20 12:52 PM    Specimen: PATH Digit amputation; Tissue  Result Value Ref Range Status   Specimen Description   Final    TOE GANGRENE LEFT 3RD TOE Performed at [facility], [address], [place], [place] [address]    Special Requests   Final    NONE Performed at [facility], [place], [place] [address]    Gram Stain   Final    ABUNDANT WBC PRESENT,BOTH PMN AND MONONUCLEAR FEW GRAM POSITIVE COCCI FEW GRAM POSITIVE RODS Performed at [facility], [place] [address], [place], [place] [address]    Culture   Final    FEW METHICILLIN RESISTANT STAPHYLOCOCCUS AUREUS FEW ENTEROCOCCUS FAECALIS VANCOMYCIN RESISTANT ENTEROCOCCUS ISOLATED    Report Status PENDING  Incomplete   Organism ID, Bacteria METHICILLIN RESISTANT STAPHYLOCOCCUS AUREUS  Final   Organism ID, Bacteria ENTEROCOCCUS FAECALIS  Final      Susceptibility   Enterococcus faecalis - MIC*    AMPICILLIN <=2 SENSITIVE Sensitive     VANCOMYCIN >=32 RESISTANT Resistant     GENTAMICIN SYNERGY SENSITIVE Sensitive     LINEZOLID 1 SENSITIVE Sensitive     * FEW ENTEROCOCCUS FAECALIS   Methicillin resistant staphylococcus aureus - MIC*    CIPROFLOXACIN >=8 RESISTANT Resistant     ERYTHROMYCIN >=8 RESISTANT Resistant     GENTAMICIN <=0.5 SENSITIVE Sensitive     OXACILLIN >=4 RESISTANT Resistant     TETRACYCLINE <=1 SENSITIVE Sensitive     VANCOMYCIN <=0.5 SENSITIVE Sensitive     TRIMETH/SULFA <=10 SENSITIVE Sensitive     CLINDAMYCIN <=0.25 SENSITIVE Sensitive     RIFAMPIN <=0.5 SENSITIVE Sensitive     Inducible Clindamycin NEGATIVE Sensitive     * FEW METHICILLIN RESISTANT STAPHYLOCOCCUS AUREUS  SARS CORONAVIRUS 2 (TAT 6-24 HRS) Nasopharyngeal Nasopharyngeal Swab     Status: None   Collection Time: 01/27/20  1:54 PM   Specimen: Nasopharyngeal Swab  Result Value Ref Range Status   SARS Coronavirus 2 NEGATIVE NEGATIVE Final    Comment: (NOTE) SARS-CoV-2 target nucleic acids are NOT DETECTED. The  SARS-CoV-2 RNA is generally detectable in upper and lower respiratory specimens during the acute phase of infection. Negative results do not preclude SARS-CoV-2 infection, do not rule out co-infections with other pathogens, and should not be used as the sole basis for treatment or other patient management decisions. Negative results must be combined with clinical observations, patient history, and epidemiological information. The expected result is Negative. Fact Sheet for Patients: SugarRoll.be Fact Sheet for Healthcare Providers: https://www.woods-mathews.com/ This test is not yet approved or cleared by the [place] FDA and  has been authorized for detection and/or diagnosis of SARS-CoV-2 by FDA under an Emergency Use Authorization (EUA). This EUA will remain  in effect (meaning this test can be used) for the duration of the COVID-19 declaration under Section 56 4(b)(1) of the Act, 21 U.S.C. section 360bbb-3(b)(1), unless the authorization is terminated or revoked sooner. Performed at [facility], [place] [address] [address] [place], [place] [address]   MRSA PCR Screening     Status: None   Collection Time: 02/01/20 10:13 AM  Result Value Ref Range Status   MRSA by PCR NEGATIVE NEGATIVE Final    Comment:        The GeneXpert MRSA Assay (FDA approved for NASAL specimens only), is one component of a comprehensive MRSA colonization surveillance program. It is not intended to diagnose MRSA infection nor to guide or monitor treatment for MRSA infections. Performed at [facility], [address] [place], [place] [address]      Labs: BNP (last 3 results) Recent Labs    01/23/20 0419  BNP [id number]   Basic Metabolic Panel: Recent Labs  Lab 01/26/20 0502 01/27/20 0500 01/28/20 0453 01/29/20 0553 01/31/20 0835  NA 137 137 136 139 136  K 4.0 4.1 4.4 4.2 4.5  CL 103 102 104 105 102  CO2 27 26 25 27 26   GLUCOSE 96 148*  139* 84 113*  BUN 10 13 17 20 22   CREATININE 0.54* 0.52* 0.58* 0.62 0.74  CALCIUM 8.6* 8.5* 8.4* 8.6* 8.5*  MG  --  2.0  --   --   --    Liver Function Tests: No results for input(s): AST, ALT, ALKPHOS, BILITOT, PROT, ALBUMIN in the last 168 hours. No results for input(s): LIPASE, AMYLASE in the last 168 hours. No results for input(s): AMMONIA in the last 168 hours. CBC: Recent Labs  Lab 01/27/20 0500 01/28/20 0453 01/29/20 0553 01/31/20 0835  WBC 15.0* 16.6* 12.2* 10.7*  NEUTROABS  --   --   --  7.6  HGB 15.7 15.0 14.6 16.0  HCT 47.3 46.1 45.4 50.0  MCV 92.6 94.5 94.8 94.0  PLT 318 313 307 318   Cardiac Enzymes: No results for input(s): CKTOTAL, CKMB, CKMBINDEX, TROPONINI in the last 168 hours. BNP: Invalid input(s): POCBNP CBG: No results for input(s): GLUCAP in the last 168 hours. D-Dimer No results for input(s): DDIMER in the last 72 hours. Hgb A1c No results for input(s): HGBA1C in the last 72 hours. Lipid Profile No results for input(s): CHOL, HDL, LDLCALC, TRIG, CHOLHDL, LDLDIRECT in the last 72 hours. Thyroid function studies No results for input(s): TSH, T4TOTAL, T3FREE, THYROIDAB in the last 72 hours.  Invalid input(s): FREET3 Anemia work up No results for input(s): VITAMINB12, FOLATE, FERRITIN, TIBC, IRON, RETICCTPCT in the last 72 hours. Urinalysis    Component Value Date/Time   COLORURINE YELLOW (A) 01/19/2020 1516   APPEARANCEUR CLEAR (A) 01/19/2020 1516   APPEARANCEUR Hazy 07/14/2012 2232   LABSPEC  1.010 01/19/2020 1516   LABSPEC 1.024 07/14/2012 2232   PHURINE 7.0 01/19/2020 1516   GLUCOSEU NEGATIVE 01/19/2020 1516   GLUCOSEU Negative 07/14/2012 2232   HGBUR NEGATIVE 01/19/2020 1516   BILIRUBINUR NEGATIVE 01/19/2020 1516   BILIRUBINUR neg 02/10/2019 0847   BILIRUBINUR Negative 07/14/2012 2232   KETONESUR NEGATIVE 01/19/2020 1516   PROTEINUR NEGATIVE 01/19/2020 1516   UROBILINOGEN 0.2 02/10/2019 0847   NITRITE NEGATIVE 01/19/2020 1516    LEUKOCYTESUR SMALL (A) 01/19/2020 1516   LEUKOCYTESUR Negative 07/14/2012 2232   Sepsis Labs Invalid input(s): PROCALCITONIN,  WBC,  LACTICIDVEN Microbiology Recent Results (from the past 240 hour(s))  MRSA PCR Screening     Status: Abnormal   Collection Time: 01/25/20 10:00 PM   Specimen: Nasopharyngeal  Result Value Ref Range Status   MRSA by PCR POSITIVE (A) NEGATIVE Final    Comment:        The GeneXpert MRSA Assay (FDA approved for NASAL specimens only), is one component of a comprehensive MRSA colonization surveillance program. It is not intended to diagnose MRSA infection nor to guide or monitor treatment for MRSA infections. RESULT CALLED TO, READ BACK BY AND VERIFIED WITH: Azzie Almas RN [phone] 01/25/20 HNM Performed at [facility], [address] [place], [place] [address]   Aerobic/Anaerobic Culture (surgical/deep wound)     Status: None (Preliminary result)   Collection Time: 01/27/20 12:52 PM   Specimen: PATH Digit amputation; Tissue  Result Value Ref Range Status   Specimen Description   Final    TOE GANGRENE LEFT 3RD TOE Performed at [facility], [address] [place], [place] [address]    Special Requests   Final    NONE Performed at [facility], [place], [place] [address]    Gram Stain   Final    ABUNDANT WBC PRESENT,BOTH PMN AND MONONUCLEAR FEW GRAM POSITIVE COCCI FEW GRAM POSITIVE RODS Performed at [facility], [place] [address] [place],  [address]    Culture   Final    FEW METHICILLIN RESISTANT STAPHYLOCOCCUS AUREUS FEW ENTEROCOCCUS FAECALIS VANCOMYCIN RESISTANT ENTEROCOCCUS ISOLATED    Report Status PENDING  Incomplete   Organism ID, Bacteria METHICILLIN RESISTANT STAPHYLOCOCCUS AUREUS  Final   Organism ID, Bacteria ENTEROCOCCUS FAECALIS  Final      Susceptibility   Enterococcus faecalis - MIC*    AMPICILLIN <=2 SENSITIVE Sensitive     VANCOMYCIN >=32 RESISTANT Resistant      GENTAMICIN SYNERGY SENSITIVE Sensitive     LINEZOLID 1 SENSITIVE Sensitive     * FEW ENTEROCOCCUS FAECALIS   Methicillin resistant staphylococcus aureus - MIC*    CIPROFLOXACIN >=8 RESISTANT Resistant     ERYTHROMYCIN >=8 RESISTANT Resistant     GENTAMICIN <=0.5 SENSITIVE Sensitive     OXACILLIN >=4 RESISTANT Resistant     TETRACYCLINE <=1 SENSITIVE Sensitive     VANCOMYCIN <=0.5 SENSITIVE Sensitive     TRIMETH/SULFA <=10 SENSITIVE Sensitive     CLINDAMYCIN <=0.25 SENSITIVE Sensitive     RIFAMPIN <=0.5 SENSITIVE Sensitive     Inducible Clindamycin NEGATIVE Sensitive     * FEW METHICILLIN RESISTANT STAPHYLOCOCCUS AUREUS  SARS CORONAVIRUS 2 (TAT 6-24 HRS) Nasopharyngeal Nasopharyngeal Swab     Status: None   Collection Time: 01/27/20  1:54 PM   Specimen: Nasopharyngeal Swab  Result Value Ref Range Status   SARS Coronavirus 2 NEGATIVE NEGATIVE Final    Comment: (NOTE) SARS-CoV-2 target nucleic acids are NOT DETECTED. The SARS-CoV-2 RNA is  generally detectable in upper and lower respiratory specimens during the acute phase of infection. Negative results do not preclude SARS-CoV-2 infection, do not rule out co-infections with other pathogens, and should not be used as the sole basis for treatment or other patient management decisions. Negative results must be combined with clinical observations, patient history, and epidemiological information. The expected result is Negative. Fact Sheet for Patients: SugarRoll.be Fact Sheet for Healthcare Providers: https://www.woods-mathews.com/ This test is not yet approved or cleared by the [place] FDA and  has been authorized for detection and/or diagnosis of SARS-CoV-2 by FDA under an Emergency Use Authorization (EUA). This EUA will remain  in effect (meaning this test can be used) for the duration of the COVID-19 declaration under Section 56 4(b)(1) of the Act, 21 U.S.C. section 360bbb-3(b)(1),  unless the authorization is terminated or revoked sooner. Performed at [facility], [place] [address] [place], [place] [address]   MRSA PCR Screening     Status: None   Collection Time: 02/01/20 10:13 AM  Result Value Ref Range Status   MRSA by PCR NEGATIVE NEGATIVE Final    Comment:        The GeneXpert MRSA Assay (FDA approved for NASAL specimens only), is one component of a comprehensive MRSA colonization surveillance program. It is not intended to diagnose MRSA infection nor to guide or monitor treatment for MRSA infections. Performed at [facility], [address], [place], [place] [address]      Time coordinating discharge: Over 30 minutes  SIGNED:   Sidney Ace, MD  [facility] 02/01/2020, 12:24 PM Pager   If 7PM-7AM, please contact night-coverage

## 2020-02-01 NOTE — TOC Transition Note (Signed)
Transition of Care [facility]) - CM/SW Discharge Note   Patient Details  Name: TESHAWN SOUCIE MRN: [id number] Date of Birth: 1941-07-15  Transition of Care [facility]) CM/SW Contact:  Su Hilt, RN Phone Number: 02/01/2020, 2:37 PM   Clinical Narrative:     The patient to DC to [facility] Room 35 A today via EMS transport, I spoke with his son Ed and provided the room number and the phone number to [facility], Bedside nurse to call report to [facility] and RNCM called Ems to transport  Final next level of care: Skilled Nursing Facility Barriers to Discharge: Barriers Resolved   Patient Goals and CMS Choice     Choice offered to / list presented to : Patient  Discharge Placement              Patient chooses bed at: [facility] Patient to be transferred to facility by: EMS Name of family member notified: Thalia Party Patient and family notified of of transfer: 02/01/20  Discharge Plan and Services   Discharge Planning Services: CM Consult Post Acute Care Choice: [place]                               Social Determinants of Health (SDOH) Interventions     Readmission Risk Interventions No flowsheet data found.

## 2020-02-02 ENCOUNTER — Telehealth: Payer: Self-pay | Admitting: Internal Medicine

## 2020-02-02 DIAGNOSIS — R7309 Other abnormal glucose: Secondary | ICD-10-CM | POA: Diagnosis not present

## 2020-02-02 DIAGNOSIS — I10 Essential (primary) hypertension: Secondary | ICD-10-CM | POA: Diagnosis not present

## 2020-02-02 DIAGNOSIS — M86172 Other acute osteomyelitis, left ankle and foot: Secondary | ICD-10-CM | POA: Diagnosis not present

## 2020-02-02 DIAGNOSIS — Z720 Tobacco use: Secondary | ICD-10-CM | POA: Diagnosis not present

## 2020-02-02 NOTE — Telephone Encounter (Signed)
No answer/phone rang busy. Pt due to schedule Medicare Annual Wellness Visit (AWV) either virtually/audio only or in office. Whichever the patients preference is.  Last AWV 01/08/18; please schedule at anytime with [facility] Health Advisor.

## 2020-02-09 DIAGNOSIS — R531 Weakness: Secondary | ICD-10-CM | POA: Diagnosis not present

## 2020-02-09 DIAGNOSIS — E440 Moderate protein-calorie malnutrition: Secondary | ICD-10-CM | POA: Diagnosis not present

## 2020-02-09 DIAGNOSIS — I739 Peripheral vascular disease, unspecified: Secondary | ICD-10-CM | POA: Diagnosis not present

## 2020-02-09 DIAGNOSIS — M86172 Other acute osteomyelitis, left ankle and foot: Secondary | ICD-10-CM | POA: Diagnosis not present

## 2020-02-10 DIAGNOSIS — Z89422 Acquired absence of other left toe(s): Secondary | ICD-10-CM | POA: Diagnosis not present

## 2020-02-10 DIAGNOSIS — I739 Peripheral vascular disease, unspecified: Secondary | ICD-10-CM | POA: Diagnosis not present

## 2020-02-10 DIAGNOSIS — L97422 Non-pressure chronic ulcer of left heel and midfoot with fat layer exposed: Secondary | ICD-10-CM | POA: Diagnosis not present

## 2020-02-10 DIAGNOSIS — G629 Polyneuropathy, unspecified: Secondary | ICD-10-CM | POA: Diagnosis not present

## 2020-02-11 ENCOUNTER — Encounter: Payer: Self-pay | Admitting: Internal Medicine

## 2020-02-17 DIAGNOSIS — G629 Polyneuropathy, unspecified: Secondary | ICD-10-CM | POA: Diagnosis not present

## 2020-02-17 DIAGNOSIS — I739 Peripheral vascular disease, unspecified: Secondary | ICD-10-CM | POA: Diagnosis not present

## 2020-02-17 DIAGNOSIS — L97422 Non-pressure chronic ulcer of left heel and midfoot with fat layer exposed: Secondary | ICD-10-CM | POA: Diagnosis not present

## 2020-02-26 DIAGNOSIS — I739 Peripheral vascular disease, unspecified: Secondary | ICD-10-CM | POA: Diagnosis not present

## 2020-02-26 DIAGNOSIS — L97422 Non-pressure chronic ulcer of left heel and midfoot with fat layer exposed: Secondary | ICD-10-CM | POA: Diagnosis not present

## 2020-02-29 ENCOUNTER — Ambulatory Visit: Payer: Medicare HMO | Admitting: Internal Medicine

## 2020-03-01 ENCOUNTER — Other Ambulatory Visit (INDEPENDENT_AMBULATORY_CARE_PROVIDER_SITE_OTHER): Payer: Self-pay | Admitting: Vascular Surgery

## 2020-03-01 DIAGNOSIS — Z95820 Peripheral vascular angioplasty status with implants and grafts: Secondary | ICD-10-CM

## 2020-03-03 ENCOUNTER — Other Ambulatory Visit: Payer: Self-pay

## 2020-03-03 ENCOUNTER — Ambulatory Visit (INDEPENDENT_AMBULATORY_CARE_PROVIDER_SITE_OTHER): Payer: Medicare HMO

## 2020-03-03 ENCOUNTER — Encounter (INDEPENDENT_AMBULATORY_CARE_PROVIDER_SITE_OTHER): Payer: Self-pay | Admitting: Vascular Surgery

## 2020-03-03 ENCOUNTER — Ambulatory Visit (INDEPENDENT_AMBULATORY_CARE_PROVIDER_SITE_OTHER): Payer: Medicare HMO | Admitting: Vascular Surgery

## 2020-03-03 VITALS — BP 142/79 | HR 66 | Resp 15

## 2020-03-03 DIAGNOSIS — I10 Essential (primary) hypertension: Secondary | ICD-10-CM

## 2020-03-03 DIAGNOSIS — E782 Mixed hyperlipidemia: Secondary | ICD-10-CM

## 2020-03-03 DIAGNOSIS — M19029 Primary osteoarthritis, unspecified elbow: Secondary | ICD-10-CM

## 2020-03-03 DIAGNOSIS — Z95820 Peripheral vascular angioplasty status with implants and grafts: Secondary | ICD-10-CM | POA: Diagnosis not present

## 2020-03-03 DIAGNOSIS — I739 Peripheral vascular disease, unspecified: Secondary | ICD-10-CM | POA: Diagnosis not present

## 2020-03-10 DIAGNOSIS — Z89422 Acquired absence of other left toe(s): Secondary | ICD-10-CM | POA: Diagnosis not present

## 2020-03-10 DIAGNOSIS — I739 Peripheral vascular disease, unspecified: Secondary | ICD-10-CM | POA: Diagnosis not present

## 2020-03-10 DIAGNOSIS — M86172 Other acute osteomyelitis, left ankle and foot: Secondary | ICD-10-CM | POA: Diagnosis not present

## 2020-03-10 DIAGNOSIS — E440 Moderate protein-calorie malnutrition: Secondary | ICD-10-CM | POA: Diagnosis not present

## 2020-03-15 ENCOUNTER — Encounter (INDEPENDENT_AMBULATORY_CARE_PROVIDER_SITE_OTHER): Payer: Self-pay | Admitting: Vascular Surgery

## 2020-03-15 NOTE — Progress Notes (Signed)
MRN : [id number]  Alexander Alvarado is a 79 y.o. ([birth year]) male who presents with chief complaint of  Chief Complaint  Patient presents with  . Follow-up    [facility] 63month post lle angio  .  History of Present Illness:   The patient returns to the office for followup and review status post angiogram with intervention on 01/26/2020.  1.  Percutaneous transluminal angioplasty and stent placement left femoral-below-knee popliteal bypass graft 2. Percutaneous transluminal angioplasty left peroneal 3. Percutaneous transluminal angioplasty and stent placement left external iliac artery.   The patient notes improvement in the lower extremity symptoms. No interval shortening of the patient's claudication distance or rest pain symptoms. Previous wounds have now healed.  No new ulcers or wounds have occurred since the last visit.  There have been no significant changes to the patient's overall health care.  The patient denies amaurosis fugax or recent TIA symptoms. There are no recent neurological changes noted. The patient denies history of DVT, PE or superficial thrombophlebitis. The patient denies recent episodes of angina or shortness of breath.   ABI's Rt=0.69 and Lt=0.92    Current Meds  Medication Sig  . acetaminophen (TYLENOL) 325 MG tablet Take 2 tablets (650 mg total) by mouth every 6 (six) hours as needed for mild pain.  Marland Kitchen amLODipine (NORVASC) 10 MG tablet Take 1 tablet (10 mg total) by mouth daily.  Marland Kitchen aspirin 81 MG tablet Take 81 mg by mouth daily.   Marland Kitchen atorvastatin (LIPITOR) 20 MG tablet TAKE 1 TABLET BY MOUTH AT BEDTIME  . clopidogrel (PLAVIX) 75 MG tablet Take 1 tablet (75 mg total) by mouth daily.  . cyanocobalamin 1000 MCG tablet Take 1,000 mcg by mouth daily.   Marland Kitchen esomeprazole (NEXIUM) 40 MG packet Take 40 mg by mouth daily before breakfast.  . folic acid (FOLVITE) 1 MG tablet Take 1 mg by mouth daily.   Marland Kitchen gabapentin (NEURONTIN) 300 MG capsule Take 1 capsule (300 mg  total) by mouth 2 (two) times daily.  . hydrALAZINE (APRESOLINE) 25 MG tablet Take 1 tablet (25 mg total) by mouth 2 (two) times daily.  Marland Kitchen ibuprofen (ADVIL) 400 MG tablet Take 1 tablet (400 mg total) by mouth 3 (three) times daily.  . povidone-Iodine (BETADINE) 5 % SOLN topical solution Apply 1 application topically as directed. Apply to left foot topically every shift for sore on left foot  . QUEtiapine (SEROQUEL) 25 MG tablet Take 1 tablet (25 mg total) by mouth at bedtime as needed (Sleep).  . quinapril (ACCUPRIL) 40 MG tablet Take 1 tablet (40 mg total) by mouth daily.  . ramelteon (ROZEREM) 8 MG tablet Take 1 tablet (8 mg total) by mouth at bedtime.  . traZODone (DESYREL) 100 MG tablet Take 1 tablet (100 mg total) by mouth at bedtime.    Past Medical History:  Diagnosis Date  . Depression   . GERD (gastroesophageal reflux disease)   . High cholesterol   . Hypertension   . PVD (peripheral vascular disease) ([place])   . Stroke [facility])    left side weakness    Past Surgical History:  Procedure Laterality Date  . AMPUTATION Left 08/31/2015   Procedure: AMPUTATION DIGIT left 2nd toe ;  Surgeon: Samara Deist, DPM;  Location: [place];  Service: Podiatry;  Laterality: Left;  . AMPUTATION Left 01/27/2020   Procedure: AMPUTATION RAY Partial 3rd toe amputation, Left heel debridement;  Surgeon: Caroline More, DPM;  Location: [facility] ORS;  Service: Podiatry;  Laterality:  Left;  . AMPUTATION TOE Left 08/2015   second toe  . CHOLECYSTECTOMY    . HIP ARTHROPLASTY Right 09/11/2019   Procedure: ARTHROPLASTY BIPOLAR HIP (HEMIARTHROPLASTY);  Surgeon: Leim Fabry, MD;  Location: [facility];  Service: Orthopedics;  Laterality: Right;  . LOWER EXTREMITY INTERVENTION Left 01/26/2020   Procedure: LOWER EXTREMITY INTERVENTION;  Surgeon: Katha Cabal, MD;  Location: [place] [facility];  Service: Cardiovascular;  Laterality: Left;  . LUMBAR DISC SURGERY    . VASCULAR SURGERY Left 2011   x 5 to  left leg    Social History Social History   Tobacco Use  . Smoking status: Current Every Day Smoker    Packs/day: 0.50    Types: Cigarettes  . Smokeless tobacco: Never Used  . Tobacco comment: 3-4 ciggs daily  Substance Use Topics  . Alcohol use: No    Alcohol/week: 0.0 standard drinks  . Drug use: No    Family History Family History  Problem Relation Age of Onset  . CAD Mother     No Known Allergies   REVIEW OF SYSTEMS (Negative unless checked)  Constitutional: [] Weight loss  [] Fever  [] Chills Cardiac: [] Chest pain   [] Chest pressure   [] Palpitations   [] Shortness of breath when laying flat   [] Shortness of breath with exertion. Vascular:  [] Pain in legs with walking   [] Pain in legs at rest  [] History of DVT   [] Phlebitis   [] Swelling in legs   [] Varicose veins   [] Non-healing ulcers Pulmonary:   [] Uses home oxygen   [] Productive cough   [] Hemoptysis   [] Wheeze  [x] COPD   [] Asthma Neurologic:  [] Dizziness   [] Seizures   [] History of stroke   [] History of TIA  [] Aphasia   [] Vissual changes   [] Weakness or numbness in arm   [] Weakness or numbness in leg Musculoskeletal:   [] Joint swelling   [] Joint pain   [] Low back pain Hematologic:  [] Easy bruising  [] Easy bleeding   [] Hypercoagulable state   [] Anemic Gastrointestinal:  [] Diarrhea   [] Vomiting  [] Gastroesophageal reflux/heartburn   [] Difficulty swallowing. Genitourinary:  [] Chronic kidney disease   [] Difficult urination  [] Frequent urination   [] Blood in urine Skin:  [] Rashes   [] Ulcers  Psychological:  [] History of anxiety   []  History of major depression.  Physical Examination  Vitals:   03/03/20 1412  BP: (!) 142/79  Pulse: 66  Resp: 15   There is no height or weight on file to calculate BMI. Gen: WD/WN, NAD Head: [place]/AT, No temporalis wasting.  Ear/Nose/Throat: Hearing grossly intact, nares w/o erythema or drainage Eyes: PER, EOMI, sclera nonicteric.  Neck: Supple, no large masses.   Pulmonary:  Good air  movement, no audible wheezing bilaterally, no use of accessory muscles.  Cardiac: RRR, no JVD Vascular: left foot warm with brisk cap refill Vessel Right Left  Radial Palpable Palpable  PT Not Palpable Trace Palpable  DP Not Palpable Trace Palpable  Gastrointestinal: Non-distended. No guarding/no peritoneal signs.  Musculoskeletal: M/S 5/5 throughout.  No deformity or atrophy.  Neurologic: CN 2-12 intact. Symmetrical.  Speech is fluent. Motor exam as listed above. Psychiatric: Judgment intact, Mood & affect appropriate for pt's clinical situation. Dermatologic: No rashes or ulcers noted.  No changes consistent with cellulitis. Lymph : No lichenification or skin changes of chronic lymphedema.  CBC Lab Results  Component Value Date   WBC 10.7 (H) 01/31/2020   HGB 16.0 01/31/2020   HCT 50.0 01/31/2020   MCV 94.0 01/31/2020   PLT 318  01/31/2020    BMET    Component Value Date/Time   NA 136 01/31/2020 0835   NA 138 02/10/2019 0852   NA 138 07/15/2012 0447   K 4.5 01/31/2020 0835   K 3.3 (L) 07/15/2012 0447   CL 102 01/31/2020 0835   CL 104 07/15/2012 0447   CO2 26 01/31/2020 0835   CO2 23 07/15/2012 0447   GLUCOSE 113 (H) 01/31/2020 0835   GLUCOSE 79 07/15/2012 0447   BUN 22 01/31/2020 0835   BUN 9 02/10/2019 0852   BUN 18 07/15/2012 0447   CREATININE 0.74 01/31/2020 0835   CREATININE 0.79 07/15/2012 0447   CALCIUM 8.5 (L) 01/31/2020 0835   CALCIUM 8.7 07/15/2012 0447   GFRNONAA >60 01/31/2020 0835   GFRNONAA >60 07/15/2012 0447   GFRAA >60 01/31/2020 0835   GFRAA >60 07/15/2012 0447   CrCl cannot be calculated (Patient's most recent lab result is older than the maximum 21 days allowed.).  COAG Lab Results  Component Value Date   INR 1.1 09/10/2019    Radiology ABI WITH/WO TBI  Result Date: 03/03/2020 LOWER EXTREMITY DOPPLER STUDY Indications: Peripheral artery disease.  Vascular Interventions: 01/26/2020 PTA & stent of left femoral below knee                          popliteal bypass graft. PTA of left peroneal. PTA and                         stent of left EIA. Performing Technologist: Charlane Ferretti RT (R)(VS)  Examination Guidelines: A complete evaluation includes at minimum, Doppler waveform signals and systolic blood pressure reading at the level of bilateral brachial, anterior tibial, and posterior tibial arteries, when vessel segments are accessible. Bilateral testing is considered an integral part of a complete examination. Photoelectric Plethysmograph (PPG) waveforms and toe systolic pressure readings are included as required and additional duplex testing as needed. Limited examinations for reoccurring indications may be performed as noted.  ABI Findings: +---------+------------------+-----+----------+--------+ Right    Rt Pressure (mmHg)IndexWaveform  Comment  +---------+------------------+-----+----------+--------+ Brachial 156                                       +---------+------------------+-----+----------+--------+ ATA      108               0.69 monophasic         +---------+------------------+-----+----------+--------+ PTA      102               0.65 monophasic         +---------+------------------+-----+----------+--------+ Great Toe160               1.03 Dampened           +---------+------------------+-----+----------+--------+ +---------+------------------+-----+----------+-------+ Left     Lt Pressure (mmHg)IndexWaveform  Comment +---------+------------------+-----+----------+-------+ Brachial 151                                      +---------+------------------+-----+----------+-------+ ATA      104               0.67 monophasic        +---------+------------------+-----+----------+-------+ PTA      143  0.92 monophasic        +---------+------------------+-----+----------+-------+ Great Toe124               0.79 Dampened           +---------+------------------+-----+----------+-------+ +-------+-----------+-----------+------------+------------+ ABI/TBIToday's ABIToday's TBIPrevious ABIPrevious TBI +-------+-----------+-----------+------------+------------+ Right  .69                                            +-------+-----------+-----------+------------+------------+  Summary: Right: Resting right ankle-brachial index indicates moderate right lower extremity arterial disease. The right toe-brachial index is normal. Left: Resting left ankle-brachial index indicates mild left lower extremity arterial disease. The left toe-brachial index is normal. *See table(s) above for measurements and observations.  Electronically signed by Hortencia Pilar MD on 03/03/2020 at 5:14:41 PM.   Final      Assessment/Plan 1. PAD (peripheral artery disease) (HCC) Recommend:  The patient is status post successful angiogram with intervention.  The patient reports that the claudication symptoms and leg pain is essentially gone.   The patient denies lifestyle limiting changes at this point in time.  No further invasive studies, angiography or surgery at this time The patient should continue walking and begin a more formal exercise program.  The patient should continue antiplatelet therapy and aggressive treatment of the lipid abnormalities  Smoking cessation was again discussed  The patient should continue wearing graduated compression socks 10-15 mmHg strength to control the mild edema.  Patient should undergo noninvasive studies as ordered. The patient will follow up with me after the studies.   - VAS [place] ABI WITH/WO TBI; Future  2. Essential hypertension Continue antihypertensive medications as already ordered, these medications have been reviewed and there are no changes at this time.   3. Hyperlipidemia, mixed Continue statin as ordered and reviewed, no changes at this time   4. Primary osteoarthritis of elbow, unspecified  laterality Continue NSAID medications as already ordered, these medications have been reviewed and there are no changes at this time.  Continued activity and therapy was stressed.    Hortencia Pilar, MD  03/15/2020 12:08 PM

## 2020-03-29 IMAGING — CT CT HIP*R* W/O CM
3 series · 8 of 16 positions shown, 9 images · non-contrast
Comparison: CT scan of the abdomen and pelvis dated 07/15/2012

CLINICAL DATA: Right hip pain secondary to a fall at home.

EXAM:
CT OF THE RIGHT HIP WITHOUT CONTRAST
TECHNIQUE: Multidetector CT imaging of the right hip was performed according to
the standard protocol. Multiplanar CT image reconstructions were
also generated.

[Series 4: axial st · axial · 0.47mm/px · z∈[-872,-778]mm · 4 of 79 slices shown, 5 images]
[im 16/79  soft-tissue]
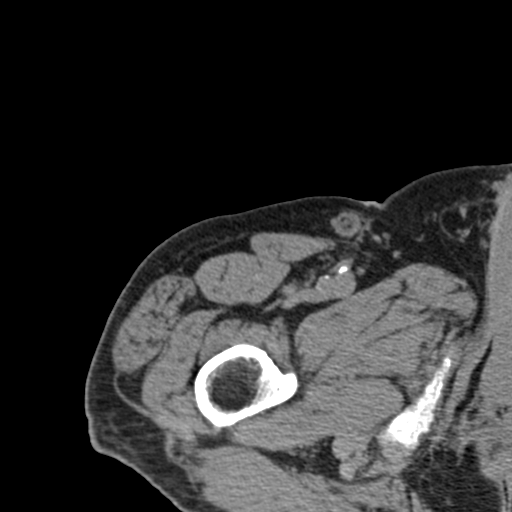
[im 16/79  bone]
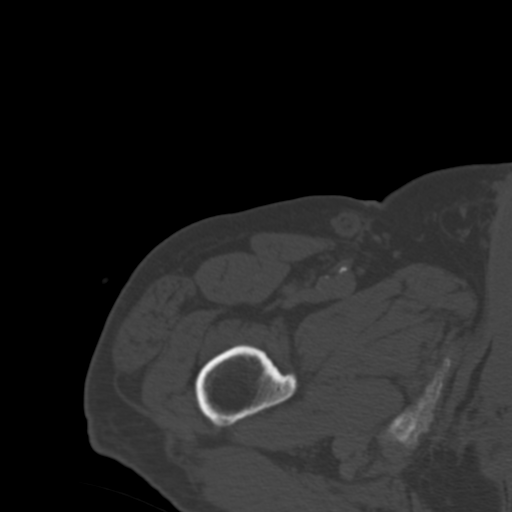
[im 32/79  soft-tissue]
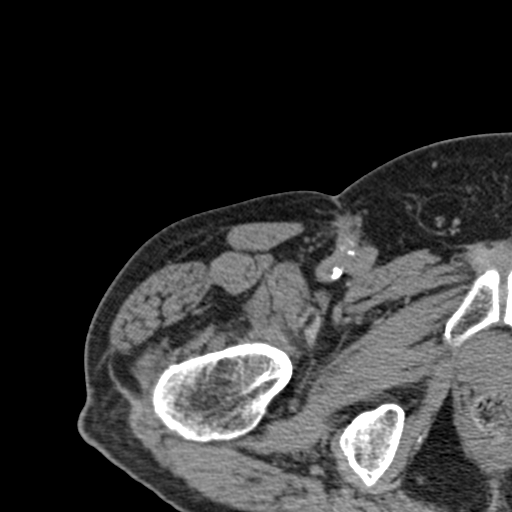
[im 47/79  soft-tissue]
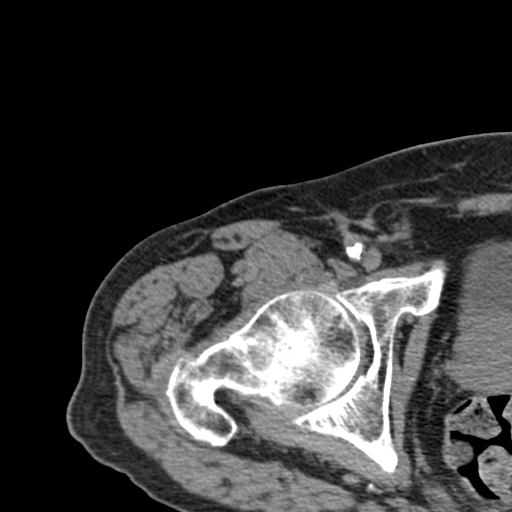
[im 63/79  soft-tissue]
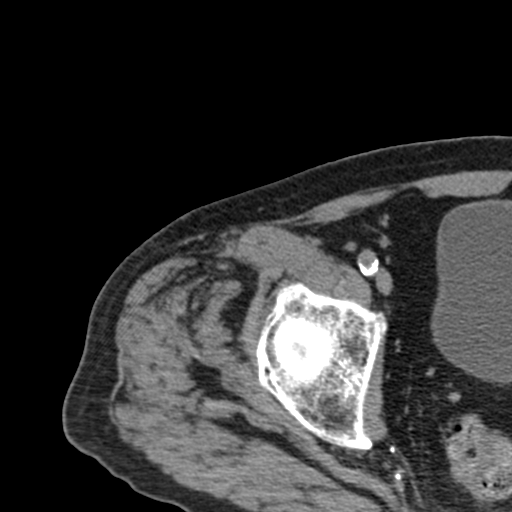

[Series 9: coronal st · coronal · 0.34mm/px · 3 of 77 slices shown]
[im 16/77  soft-tissue]
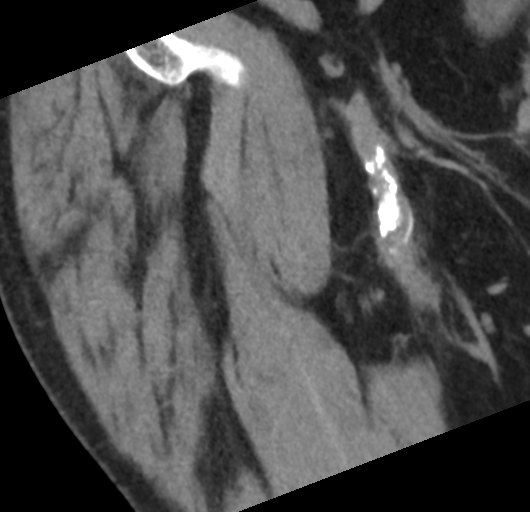
[im 31/77  soft-tissue]
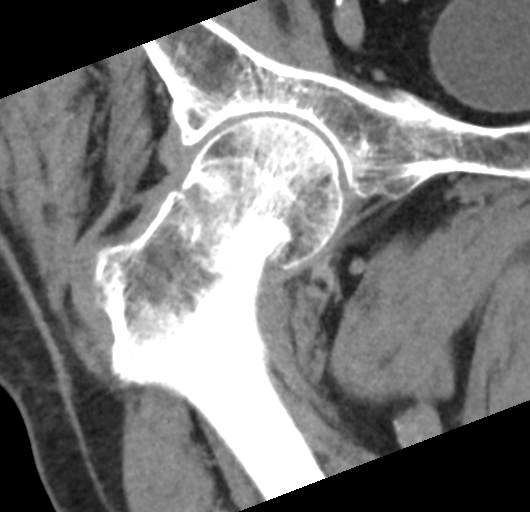
[im 46/77  soft-tissue]
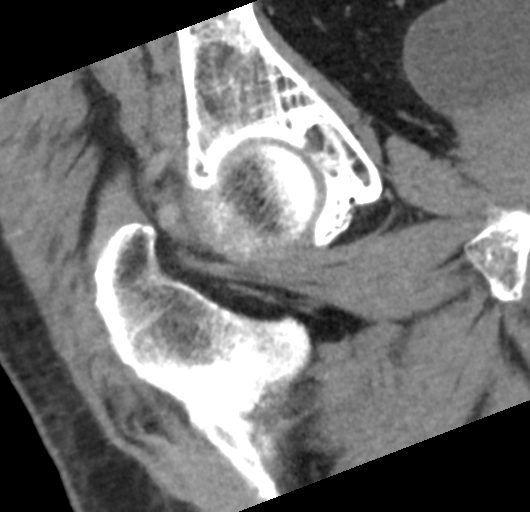

[Series 10: sagittal st · sagittal · 0.30mm/px · 1 of 60 slices shown]
[im 20/60  soft-tissue]
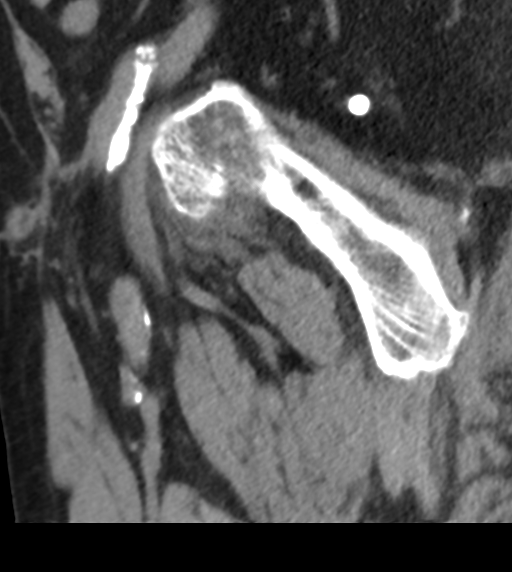

[8 of 16 positions shown; findings below may reference images not displayed]

FINDINGS: Bones/Joint/Cartilage

There is an impacted fracture of the proximal right femoral neck. No
dislocation. The visualized pelvic bones are intact. No significant
arthritic changes of right hip joint.

Muscles and Tendons

Normal.

Soft tissues

No acute abnormality. Fem-fem bypass graft.
IMPRESSION: Impacted fracture of the proximal right femoral neck.

## 2020-03-30 ENCOUNTER — Other Ambulatory Visit: Payer: Self-pay

## 2020-03-30 NOTE — Patient Outreach (Signed)
[facility]) Care Management  03/30/2020  Alexander Alvarado Apr 08, 1941 [id number]   Referral Date: 03/30/20 Referral Source: [employer] Report Date of Discharge: 03/28/20 Facility:  [place]: [facility]  Referral received.  No outreach warranted at this time.  Transition of Care calls being completed via EMMI. RN CM will outreach patient for any red flags received.    Plan: RN CM will close case.    Jone Baseman, RN, MSN [facility] Care Management Care Management Coordinator Direct Line [phone] Toll Free: [phone]  Fax: [phone]

## 2020-05-22 DEATH — deceased

## 2020-06-06 ENCOUNTER — Ambulatory Visit (INDEPENDENT_AMBULATORY_CARE_PROVIDER_SITE_OTHER): Payer: Medicare HMO | Admitting: Vascular Surgery

## 2020-06-06 ENCOUNTER — Encounter (INDEPENDENT_AMBULATORY_CARE_PROVIDER_SITE_OTHER): Payer: Medicare HMO

## 2020-08-10 IMAGING — CR DG CHEST 2V
1 series · 3 of 3 positions shown · non-contrast
Comparison: 09/11/2019

CLINICAL DATA: Hypoxia, CHF, hypertension, smoker

EXAM:
CHEST - 2 VIEW

[Series 1: dg chest 2 view · 0.14mm/px · 3 of 3 slices shown]
[im 1/3]
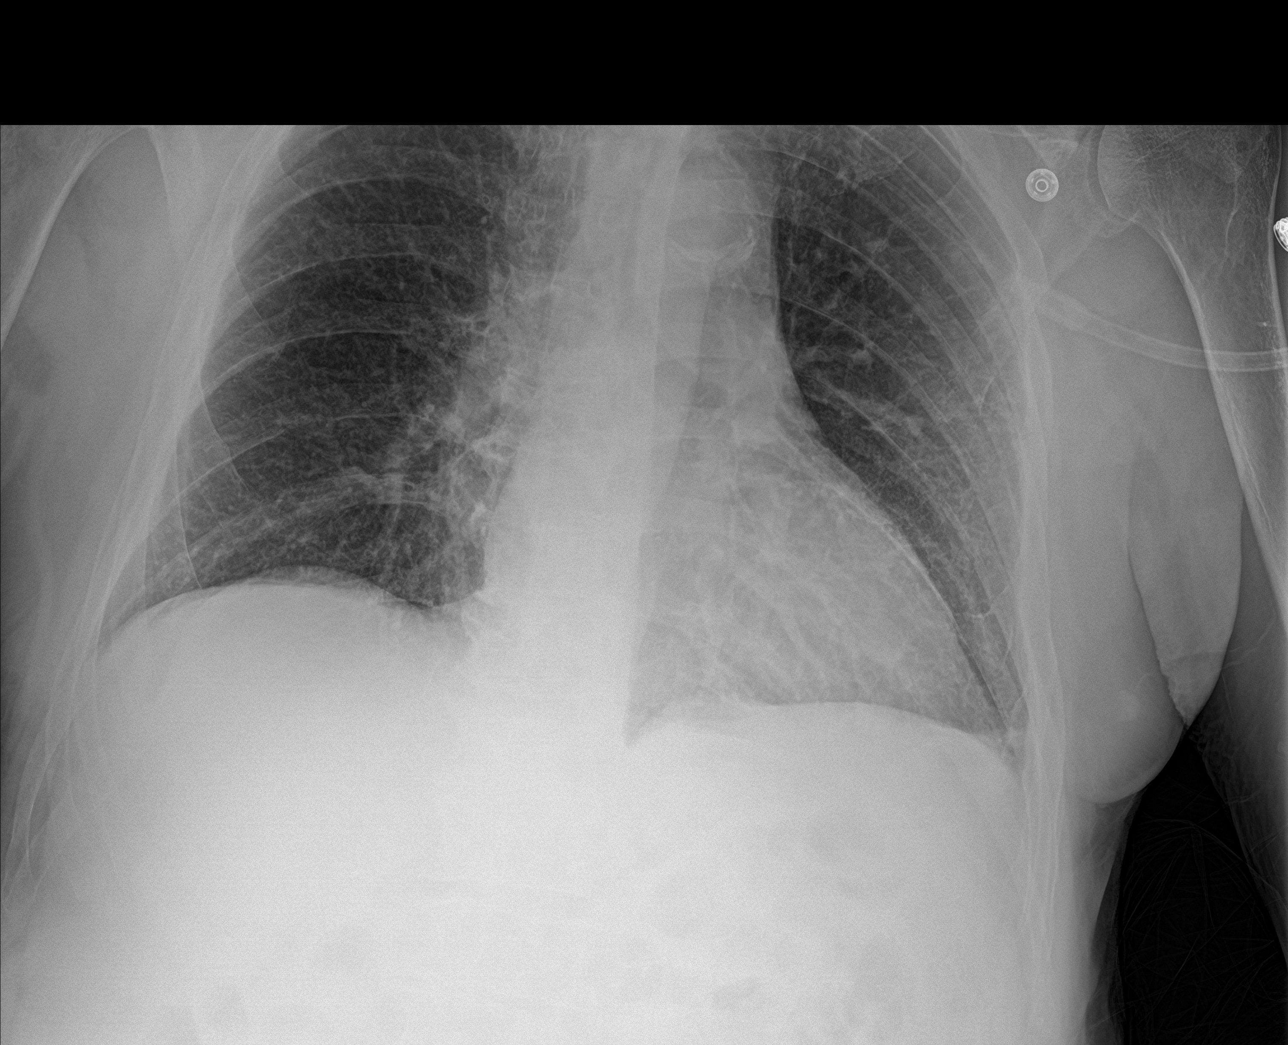
[im 2/3]
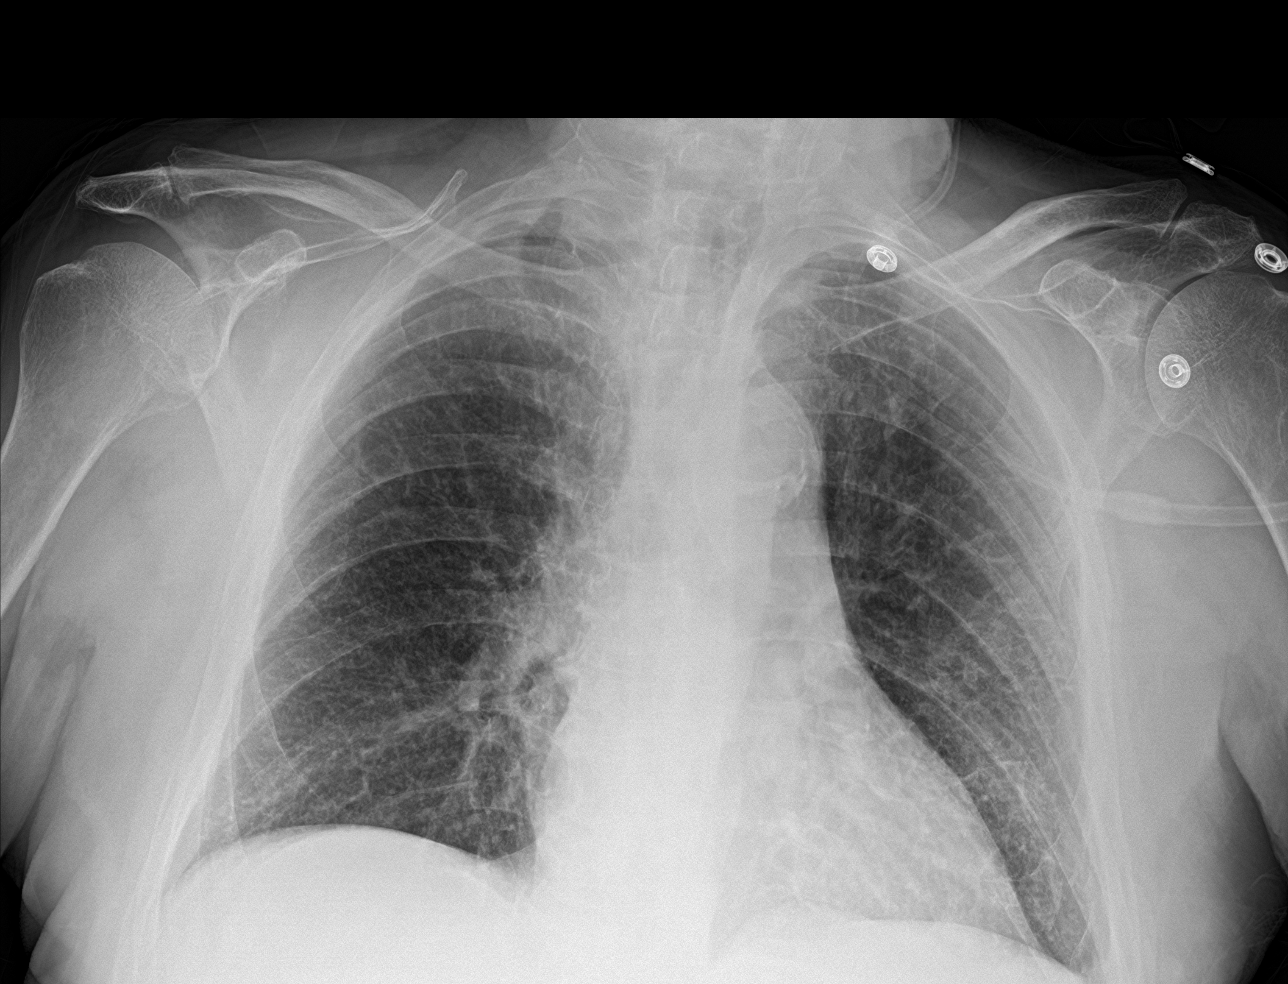
[im 3/3]
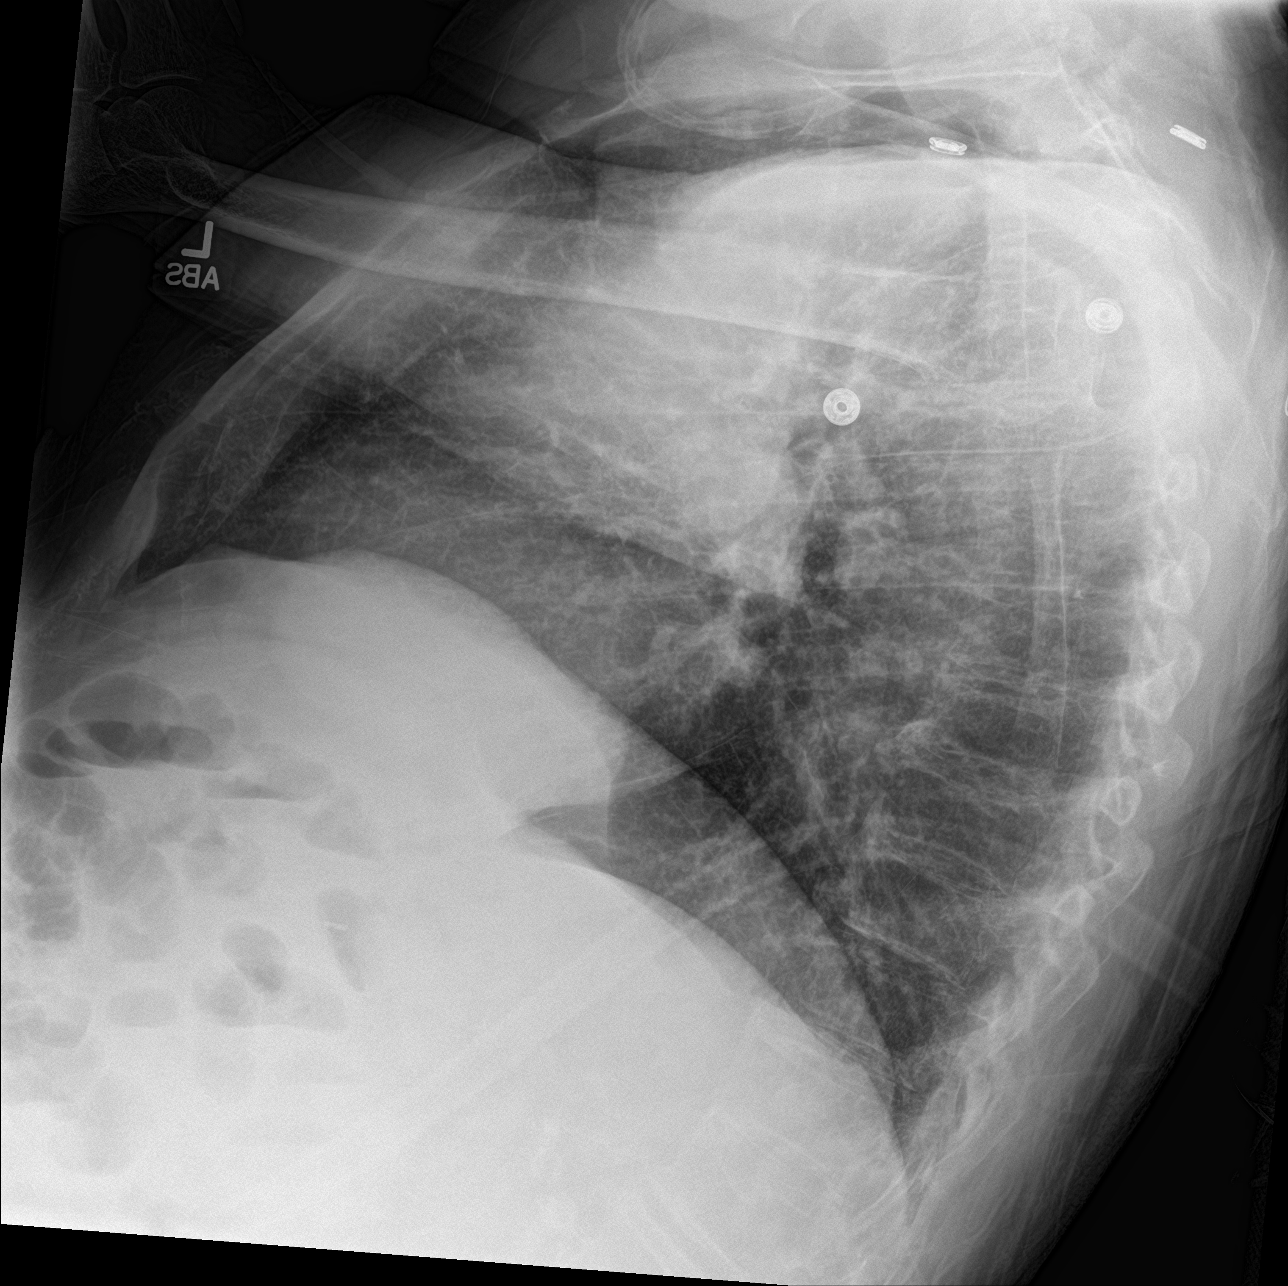

[3 of 3 positions shown; findings below may reference images not displayed]

FINDINGS: Normal heart size, mediastinal contours, and pulmonary vascularity.

Atherosclerotic calcification aorta.

Chronic thickening of interstitial markings.

No acute infiltrate, pleural effusion or pneumothorax.

Bones demineralized.
IMPRESSION: Chronic interstitial prominence.

No acute abnormalities.
